# Patient Record
Sex: Male | Born: 1975 | Race: White | Hispanic: No | Smoking: Former smoker
Health system: Southern US, Community
[De-identification: ages and names within clinical notes are randomized; demographics above are authoritative.]

## PROBLEM LIST (undated history)

## (undated) DIAGNOSIS — F419 Anxiety disorder, unspecified: Secondary | ICD-10-CM

## (undated) DIAGNOSIS — I639 Cerebral infarction, unspecified: Secondary | ICD-10-CM

## (undated) DIAGNOSIS — G8929 Other chronic pain: Secondary | ICD-10-CM

## (undated) DIAGNOSIS — E119 Type 2 diabetes mellitus without complications: Secondary | ICD-10-CM

## (undated) DIAGNOSIS — R0600 Dyspnea, unspecified: Secondary | ICD-10-CM

## (undated) DIAGNOSIS — K859 Acute pancreatitis without necrosis or infection, unspecified: Secondary | ICD-10-CM

## (undated) DIAGNOSIS — I10 Essential (primary) hypertension: Secondary | ICD-10-CM

## (undated) DIAGNOSIS — Z22322 Carrier or suspected carrier of Methicillin resistant Staphylococcus aureus: Secondary | ICD-10-CM

## (undated) DIAGNOSIS — G4730 Sleep apnea, unspecified: Secondary | ICD-10-CM

## (undated) DIAGNOSIS — E669 Obesity, unspecified: Secondary | ICD-10-CM

## (undated) DIAGNOSIS — M419 Scoliosis, unspecified: Secondary | ICD-10-CM

## (undated) DIAGNOSIS — G629 Polyneuropathy, unspecified: Secondary | ICD-10-CM

## (undated) DIAGNOSIS — E7800 Pure hypercholesterolemia, unspecified: Secondary | ICD-10-CM

## (undated) DIAGNOSIS — M545 Low back pain: Secondary | ICD-10-CM

## (undated) DIAGNOSIS — K429 Umbilical hernia without obstruction or gangrene: Secondary | ICD-10-CM

## (undated) DIAGNOSIS — E785 Hyperlipidemia, unspecified: Secondary | ICD-10-CM

## (undated) DIAGNOSIS — Z9989 Dependence on other enabling machines and devices: Secondary | ICD-10-CM

## (undated) DIAGNOSIS — M1990 Unspecified osteoarthritis, unspecified site: Secondary | ICD-10-CM

## (undated) DIAGNOSIS — R519 Headache, unspecified: Secondary | ICD-10-CM

## (undated) DIAGNOSIS — R51 Headache: Secondary | ICD-10-CM

## (undated) DIAGNOSIS — G459 Transient cerebral ischemic attack, unspecified: Secondary | ICD-10-CM

## (undated) DIAGNOSIS — G4733 Obstructive sleep apnea (adult) (pediatric): Secondary | ICD-10-CM

## (undated) HISTORY — DX: Obstructive sleep apnea (adult) (pediatric): G47.33

## (undated) HISTORY — DX: Polyneuropathy, unspecified: G62.9

## (undated) HISTORY — PX: TESTICLE REMOVAL: SHX68

## (undated) HISTORY — DX: Dependence on other enabling machines and devices: Z99.89

## (undated) HISTORY — PX: HIP PINNING: SHX1757

## (undated) HISTORY — DX: Hyperlipidemia, unspecified: E78.5

---

## 2002-08-24 ENCOUNTER — Encounter: Payer: Self-pay | Admitting: Emergency Medicine

## 2002-08-24 ENCOUNTER — Emergency Department (HOSPITAL_COMMUNITY): Admission: EM | Admit: 2002-08-24 | Discharge: 2002-08-24 | Payer: Self-pay | Admitting: Emergency Medicine

## 2005-01-14 ENCOUNTER — Ambulatory Visit (HOSPITAL_BASED_OUTPATIENT_CLINIC_OR_DEPARTMENT_OTHER): Admission: RE | Admit: 2005-01-14 | Discharge: 2005-01-14 | Payer: Self-pay | Admitting: Family Medicine

## 2005-01-20 ENCOUNTER — Ambulatory Visit: Payer: Self-pay | Admitting: Internal Medicine

## 2006-05-11 ENCOUNTER — Emergency Department (HOSPITAL_COMMUNITY): Admission: EM | Admit: 2006-05-11 | Discharge: 2006-05-12 | Payer: Self-pay | Admitting: Emergency Medicine

## 2006-05-13 ENCOUNTER — Emergency Department (HOSPITAL_COMMUNITY): Admission: EM | Admit: 2006-05-13 | Discharge: 2006-05-14 | Payer: Self-pay | Admitting: Emergency Medicine

## 2006-10-03 ENCOUNTER — Emergency Department (HOSPITAL_COMMUNITY): Admission: EM | Admit: 2006-10-03 | Discharge: 2006-10-04 | Payer: Self-pay | Admitting: Emergency Medicine

## 2007-06-17 ENCOUNTER — Ambulatory Visit (HOSPITAL_COMMUNITY): Admission: RE | Admit: 2007-06-17 | Discharge: 2007-06-17 | Payer: Self-pay | Admitting: Family Medicine

## 2007-07-01 ENCOUNTER — Encounter: Admission: RE | Admit: 2007-07-01 | Discharge: 2007-07-01 | Payer: Self-pay | Admitting: Family Medicine

## 2008-01-12 ENCOUNTER — Emergency Department (HOSPITAL_COMMUNITY): Admission: EM | Admit: 2008-01-12 | Discharge: 2008-01-12 | Payer: Self-pay | Admitting: Family Medicine

## 2009-06-04 ENCOUNTER — Emergency Department (HOSPITAL_COMMUNITY): Admission: EM | Admit: 2009-06-04 | Discharge: 2009-06-04 | Payer: Self-pay | Admitting: Emergency Medicine

## 2010-03-25 ENCOUNTER — Encounter: Payer: Self-pay | Admitting: Chiropractic Medicine

## 2010-07-03 DIAGNOSIS — K859 Acute pancreatitis without necrosis or infection, unspecified: Secondary | ICD-10-CM

## 2010-07-03 HISTORY — PX: PICC LINE INSERTION: CATH118290

## 2010-07-03 HISTORY — DX: Acute pancreatitis without necrosis or infection, unspecified: K85.90

## 2010-07-08 ENCOUNTER — Inpatient Hospital Stay (HOSPITAL_COMMUNITY)
Admission: EM | Admit: 2010-07-08 | Discharge: 2010-07-21 | DRG: 438 | Disposition: A | Discharge status: Home / Self Care | Payer: Medicaid Other | Attending: Internal Medicine | Admitting: Internal Medicine

## 2010-07-08 ENCOUNTER — Emergency Department (HOSPITAL_COMMUNITY): Payer: Medicaid Other

## 2010-07-08 ENCOUNTER — Inpatient Hospital Stay (HOSPITAL_COMMUNITY): Payer: Medicaid Other

## 2010-07-08 DIAGNOSIS — K7689 Other specified diseases of liver: Secondary | ICD-10-CM | POA: Diagnosis present

## 2010-07-08 DIAGNOSIS — N453 Epididymo-orchitis: Secondary | ICD-10-CM | POA: Diagnosis not present

## 2010-07-08 DIAGNOSIS — E781 Pure hyperglyceridemia: Secondary | ICD-10-CM | POA: Diagnosis present

## 2010-07-08 DIAGNOSIS — Z833 Family history of diabetes mellitus: Secondary | ICD-10-CM

## 2010-07-08 DIAGNOSIS — Z6841 Body Mass Index (BMI) 40.0 and over, adult: Secondary | ICD-10-CM

## 2010-07-08 DIAGNOSIS — K859 Acute pancreatitis without necrosis or infection, unspecified: Principal | ICD-10-CM | POA: Diagnosis present

## 2010-07-08 DIAGNOSIS — N5089 Other specified disorders of the male genital organs: Secondary | ICD-10-CM | POA: Diagnosis not present

## 2010-07-08 DIAGNOSIS — E871 Hypo-osmolality and hyponatremia: Secondary | ICD-10-CM | POA: Diagnosis present

## 2010-07-08 DIAGNOSIS — F17200 Nicotine dependence, unspecified, uncomplicated: Secondary | ICD-10-CM | POA: Diagnosis present

## 2010-07-08 DIAGNOSIS — G4733 Obstructive sleep apnea (adult) (pediatric): Secondary | ICD-10-CM | POA: Diagnosis present

## 2010-07-08 DIAGNOSIS — R5381 Other malaise: Secondary | ICD-10-CM | POA: Diagnosis not present

## 2010-07-08 DIAGNOSIS — E1310 Other specified diabetes mellitus with ketoacidosis without coma: Secondary | ICD-10-CM | POA: Diagnosis present

## 2010-07-08 DIAGNOSIS — R609 Edema, unspecified: Secondary | ICD-10-CM | POA: Diagnosis not present

## 2010-07-08 LAB — BASIC METABOLIC PANEL
CO2: 17 mEq/L — ABNORMAL LOW (ref 19–32)
Calcium: 7.4 mg/dL — ABNORMAL LOW (ref 8.4–10.5)
Chloride: 104 mEq/L (ref 96–112)
Chloride: 106 mEq/L (ref 96–112)
Creatinine, Ser: 0.71 mg/dL (ref 0.4–1.5)
GFR calc Af Amer: >60 mL/min (ref >60)
GFR calc non Af Amer: >60 mL/min (ref >60)
Glucose, Bld: 262 mg/dL — ABNORMAL HIGH (ref 70–99)
Glucose, Bld: 225 mg/dL — ABNORMAL HIGH (ref 70–99)
Potassium: 3.6 mEq/L (ref 3.5–5.1)
Sodium: 136 mEq/L (ref 135–145)

## 2010-07-08 LAB — COMPREHENSIVE METABOLIC PANEL
ALT: 15 U/L (ref 0–53)
Albumin: 3.2 g/dL — ABNORMAL LOW (ref 3.5–5.2)
Alkaline Phosphatase: 108 U/L (ref 39–117)
BUN: 9 mg/dL (ref 6–23)
Chloride: 97 mEq/L (ref 96–112)
Glucose, Bld: 323 mg/dL — ABNORMAL HIGH (ref 70–99)
Potassium: 3.8 mEq/L (ref 3.5–5.1)
Sodium: 132 mEq/L — ABNORMAL LOW (ref 135–145)
Total Bilirubin: 0.6 mg/dL (ref 0.3–1.2)
Total Protein: 7.7 g/dL (ref 6.0–8.3)

## 2010-07-08 LAB — DIFFERENTIAL
Band Neutrophils: 0 % (ref 0–10)
Basophils Absolute: 0 10*3/uL (ref 0.0–0.1)
Basophils Relative: 0 % (ref 0–1)
Blasts: 0 %
Eosinophils Absolute: 0 10*3/uL (ref 0.0–0.7)
Lymphocytes Relative: 6 % — ABNORMAL LOW (ref 12–46)
Lymphs Abs: 0.8 10*3/uL (ref 0.7–4.0)
Metamyelocytes Relative: 0 %
Monocytes Absolute: 1.2 10*3/uL — ABNORMAL HIGH (ref 0.1–1.0)
Monocytes Relative: 9 % (ref 3–12)

## 2010-07-08 LAB — CBC
Platelets: 125 10*3/uL — ABNORMAL LOW (ref 150–400)
RBC: 4.95 MIL/uL (ref 4.22–5.81)
RDW: 13.6 % (ref 11.5–15.5)
WBC: 13.7 10*3/uL — ABNORMAL HIGH (ref 4.0–10.5)

## 2010-07-08 LAB — CARDIAC PANEL(CRET KIN+CKTOT+MB+TROPI)
Relative Index: INVALID (ref 0.0–2.5)
Total CK: 71 U/L (ref 7–232)
Troponin I: <0.3 ng/mL (ref <0.30)

## 2010-07-08 LAB — URINALYSIS, ROUTINE W REFLEX MICROSCOPIC
Bilirubin Urine: NEGATIVE
Ketones, ur: >80 mg/dL — AB
Nitrite: NEGATIVE
Urobilinogen, UA: 0.2 mg/dL (ref 0.0–1.0)

## 2010-07-08 LAB — URINE MICROSCOPIC-ADD ON

## 2010-07-08 LAB — GLUCOSE, CAPILLARY
Glucose-Capillary: 208 mg/dL — ABNORMAL HIGH (ref 70–99)
Glucose-Capillary: 211 mg/dL — ABNORMAL HIGH (ref 70–99)
Glucose-Capillary: 259 mg/dL — ABNORMAL HIGH (ref 70–99)
Glucose-Capillary: 263 mg/dL — ABNORMAL HIGH (ref 70–99)
Glucose-Capillary: 272 mg/dL — ABNORMAL HIGH (ref 70–99)

## 2010-07-08 LAB — PHOSPHORUS: Phosphorus: 2.2 mg/dL — ABNORMAL LOW (ref 2.3–4.6)

## 2010-07-09 ENCOUNTER — Encounter (HOSPITAL_COMMUNITY): Payer: Self-pay

## 2010-07-09 ENCOUNTER — Inpatient Hospital Stay (HOSPITAL_COMMUNITY): Payer: Medicaid Other

## 2010-07-09 LAB — LIPID PANEL
LDL Cholesterol: UNDETERMINED mg/dL (ref 0–99)
Triglycerides: 2228 mg/dL — ABNORMAL HIGH (ref <150)

## 2010-07-09 LAB — BASIC METABOLIC PANEL
BUN: 10 mg/dL (ref 6–23)
CO2: 19 mEq/L (ref 19–32)
CO2: 19 mEq/L (ref 19–32)
Calcium: 7.2 mg/dL — ABNORMAL LOW (ref 8.4–10.5)
Calcium: 7.6 mg/dL — ABNORMAL LOW (ref 8.4–10.5)
Chloride: 104 mEq/L (ref 96–112)
Chloride: 104 mEq/L (ref 96–112)
Creatinine, Ser: 0.66 mg/dL (ref 0.4–1.5)
Creatinine, Ser: 0.75 mg/dL (ref 0.4–1.5)
GFR calc Af Amer: >60 mL/min (ref >60)
Glucose, Bld: 179 mg/dL — ABNORMAL HIGH (ref 70–99)
Glucose, Bld: 175 mg/dL — ABNORMAL HIGH (ref 70–99)
Glucose, Bld: 206 mg/dL — ABNORMAL HIGH (ref 70–99)
Potassium: 4.2 mEq/L (ref 3.5–5.1)
Potassium: 4.3 mEq/L (ref 3.5–5.1)
Sodium: 135 mEq/L (ref 135–145)
Sodium: 135 mEq/L (ref 135–145)

## 2010-07-09 LAB — GLUCOSE, CAPILLARY
Glucose-Capillary: 184 mg/dL — ABNORMAL HIGH (ref 70–99)
Glucose-Capillary: 209 mg/dL — ABNORMAL HIGH (ref 70–99)
Glucose-Capillary: 141 mg/dL — ABNORMAL HIGH (ref 70–99)
Glucose-Capillary: 256 mg/dL — ABNORMAL HIGH (ref 70–99)

## 2010-07-09 LAB — CARDIAC PANEL(CRET KIN+CKTOT+MB+TROPI)
CK, MB: 1.3 ng/mL (ref 0.3–4.0)
Relative Index: INVALID (ref 0.0–2.5)
Total CK: 83 U/L (ref 7–232)
Troponin I: <0.3 ng/mL (ref <0.30)

## 2010-07-09 LAB — CBC
HCT: 43.4 % (ref 39.0–52.0)
Hemoglobin: 15.2 g/dL (ref 13.0–17.0)
MCHC: 35 g/dL (ref 30.0–36.0)

## 2010-07-09 MED ORDER — IOHEXOL 300 MG/ML  SOLN
100.0000 mL | Freq: Once | INTRAMUSCULAR | Status: AC | PRN
  Administered 2010-07-09: 100 mL via INTRAVENOUS

## 2010-07-10 LAB — BASIC METABOLIC PANEL
CO2: 19 mEq/L (ref 19–32)
Chloride: 99 mEq/L (ref 96–112)
Creatinine, Ser: 1.44 mg/dL (ref 0.4–1.5)
GFR calc Af Amer: >60 mL/min (ref >60)
Sodium: 130 mEq/L — ABNORMAL LOW (ref 135–145)

## 2010-07-10 LAB — COMPREHENSIVE METABOLIC PANEL
ALT: 12 U/L (ref 0–53)
CO2: 15 mEq/L — ABNORMAL LOW (ref 19–32)
Calcium: 7.6 mg/dL — ABNORMAL LOW (ref 8.4–10.5)
Chloride: 100 mEq/L (ref 96–112)
Creatinine, Ser: 1.29 mg/dL (ref 0.4–1.5)
GFR calc non Af Amer: >60 mL/min (ref >60)
Glucose, Bld: 284 mg/dL — ABNORMAL HIGH (ref 70–99)
Sodium: 131 mEq/L — ABNORMAL LOW (ref 135–145)
Total Bilirubin: 0.4 mg/dL (ref 0.3–1.2)

## 2010-07-10 LAB — HEMOGLOBIN A1C: Hgb A1c MFr Bld: 12.7 % — ABNORMAL HIGH (ref <5.7)

## 2010-07-10 LAB — GLUCOSE, CAPILLARY
Glucose-Capillary: 271 mg/dL — ABNORMAL HIGH (ref 70–99)
Glucose-Capillary: 298 mg/dL — ABNORMAL HIGH (ref 70–99)
Glucose-Capillary: 231 mg/dL — ABNORMAL HIGH (ref 70–99)

## 2010-07-11 LAB — GLUCOSE, CAPILLARY
Glucose-Capillary: 227 mg/dL — ABNORMAL HIGH (ref 70–99)
Glucose-Capillary: 216 mg/dL — ABNORMAL HIGH (ref 70–99)

## 2010-07-11 LAB — BASIC METABOLIC PANEL
CO2: 22 mEq/L (ref 19–32)
Chloride: 101 mEq/L (ref 96–112)
GFR calc Af Amer: >60 mL/min (ref >60)
Potassium: 4.4 mEq/L (ref 3.5–5.1)
Sodium: 132 mEq/L — ABNORMAL LOW (ref 135–145)

## 2010-07-11 LAB — CBC
Hemoglobin: 12.6 g/dL — ABNORMAL LOW (ref 13.0–17.0)
RBC: 4.1 MIL/uL — ABNORMAL LOW (ref 4.22–5.81)
WBC: 9.5 10*3/uL (ref 4.0–10.5)

## 2010-07-12 LAB — GLUCOSE, CAPILLARY
Glucose-Capillary: 156 mg/dL — ABNORMAL HIGH (ref 70–99)
Glucose-Capillary: 221 mg/dL — ABNORMAL HIGH (ref 70–99)

## 2010-07-12 LAB — CBC
HCT: 40.8 % (ref 39.0–52.0)
Hemoglobin: 12.7 g/dL — ABNORMAL LOW (ref 13.0–17.0)
MCHC: 31.1 g/dL (ref 30.0–36.0)
MCV: 96 fL (ref 78.0–100.0)
RDW: 14.6 % (ref 11.5–15.5)
WBC: 5.3 10*3/uL (ref 4.0–10.5)

## 2010-07-12 LAB — COMPREHENSIVE METABOLIC PANEL
Alkaline Phosphatase: 72 U/L (ref 39–117)
BUN: 18 mg/dL (ref 6–23)
CO2: 26 mEq/L (ref 19–32)
GFR calc non Af Amer: >60 mL/min (ref >60)
Glucose, Bld: 206 mg/dL — ABNORMAL HIGH (ref 70–99)
Potassium: 3.6 mEq/L (ref 3.5–5.1)
Total Protein: 6.6 g/dL (ref 6.0–8.3)

## 2010-07-12 LAB — LIPASE, BLOOD: Lipase: 46 U/L (ref 11–59)

## 2010-07-13 ENCOUNTER — Inpatient Hospital Stay (HOSPITAL_COMMUNITY): Payer: Medicaid Other

## 2010-07-13 LAB — GLUCOSE, CAPILLARY
Glucose-Capillary: 167 mg/dL — ABNORMAL HIGH (ref 70–99)
Glucose-Capillary: 154 mg/dL — ABNORMAL HIGH (ref 70–99)
Glucose-Capillary: 241 mg/dL — ABNORMAL HIGH (ref 70–99)

## 2010-07-13 LAB — CBC
Hemoglobin: 12.2 g/dL — ABNORMAL LOW (ref 13.0–17.0)
MCH: 30.3 pg (ref 26.0–34.0)
MCHC: 31.8 g/dL (ref 30.0–36.0)
Platelets: 94 10*3/uL — ABNORMAL LOW (ref 150–400)
RDW: 14.6 % (ref 11.5–15.5)

## 2010-07-13 LAB — BASIC METABOLIC PANEL
Calcium: 8.2 mg/dL — ABNORMAL LOW (ref 8.4–10.5)
Creatinine, Ser: 0.55 mg/dL (ref 0.4–1.5)
GFR calc Af Amer: >60 mL/min (ref >60)
GFR calc non Af Amer: >60 mL/min (ref >60)
Sodium: 137 mEq/L (ref 135–145)

## 2010-07-13 LAB — LIPASE, BLOOD: Lipase: 35 U/L (ref 11–59)

## 2010-07-14 LAB — LIPID PANEL
Cholesterol: 366 mg/dL — ABNORMAL HIGH (ref 0–200)
HDL: 21 mg/dL — ABNORMAL LOW (ref >39)
Triglycerides: 518 mg/dL — ABNORMAL HIGH (ref <150)
VLDL: UNDETERMINED mg/dL (ref 0–40)

## 2010-07-14 LAB — DIFFERENTIAL
Basophils Absolute: 0 10*3/uL (ref 0.0–0.1)
Basophils Relative: 0 % (ref 0–1)
Eosinophils Absolute: 0.1 10*3/uL (ref 0.0–0.7)
Lymphocytes Relative: 13 % (ref 12–46)
Monocytes Absolute: 0.9 10*3/uL (ref 0.1–1.0)
Neutrophils Relative %: 77 % (ref 43–77)

## 2010-07-14 LAB — COMPREHENSIVE METABOLIC PANEL
ALT: 13 U/L (ref 0–53)
AST: 17 U/L (ref 0–37)
Albumin: 1.8 g/dL — ABNORMAL LOW (ref 3.5–5.2)
CO2: 30 mEq/L (ref 19–32)
Chloride: 97 mEq/L (ref 96–112)
Creatinine, Ser: <0.47 mg/dL (ref 0.4–1.5)
Potassium: 3.4 mEq/L — ABNORMAL LOW (ref 3.5–5.1)
Sodium: 134 mEq/L — ABNORMAL LOW (ref 135–145)
Total Bilirubin: 0.4 mg/dL (ref 0.3–1.2)

## 2010-07-14 LAB — PHOSPHORUS: Phosphorus: 2.2 mg/dL — ABNORMAL LOW (ref 2.3–4.6)

## 2010-07-14 LAB — CBC
Hemoglobin: 12.7 g/dL — ABNORMAL LOW (ref 13.0–17.0)
MCH: 30.6 pg (ref 26.0–34.0)
Platelets: 112 10*3/uL — ABNORMAL LOW (ref 150–400)
RBC: 4.15 MIL/uL — ABNORMAL LOW (ref 4.22–5.81)
WBC: 9.6 10*3/uL (ref 4.0–10.5)

## 2010-07-14 LAB — GLUCOSE, CAPILLARY
Glucose-Capillary: 295 mg/dL — ABNORMAL HIGH (ref 70–99)
Glucose-Capillary: 317 mg/dL — ABNORMAL HIGH (ref 70–99)

## 2010-07-15 LAB — GLUCOSE, CAPILLARY
Glucose-Capillary: 206 mg/dL — ABNORMAL HIGH (ref 70–99)
Glucose-Capillary: 286 mg/dL — ABNORMAL HIGH (ref 70–99)

## 2010-07-15 LAB — CBC
HCT: 36.7 % — ABNORMAL LOW (ref 39.0–52.0)
Hemoglobin: 11.7 g/dL — ABNORMAL LOW (ref 13.0–17.0)
MCH: 30.2 pg (ref 26.0–34.0)
MCHC: 31.9 g/dL (ref 30.0–36.0)
RDW: 14.3 % (ref 11.5–15.5)

## 2010-07-15 LAB — LIPASE, BLOOD: Lipase: 31 U/L (ref 11–59)

## 2010-07-15 LAB — CULTURE, BLOOD (ROUTINE X 2)
Culture  Setup Time: 201205070006
Culture: NO GROWTH

## 2010-07-15 LAB — URINALYSIS, ROUTINE W REFLEX MICROSCOPIC
Bilirubin Urine: NEGATIVE
Glucose, UA: >1000 mg/dL — AB
Nitrite: NEGATIVE
Specific Gravity, Urine: 1.03 (ref 1.005–1.030)
pH: 7.5 (ref 5.0–8.0)

## 2010-07-15 LAB — BASIC METABOLIC PANEL
BUN: 11 mg/dL (ref 6–23)
CO2: 30 mEq/L (ref 19–32)
Calcium: 8.2 mg/dL — ABNORMAL LOW (ref 8.4–10.5)
Creatinine, Ser: <0.47 mg/dL (ref 0.4–1.5)
Glucose, Bld: 239 mg/dL — ABNORMAL HIGH (ref 70–99)

## 2010-07-16 ENCOUNTER — Inpatient Hospital Stay (HOSPITAL_COMMUNITY): Payer: Medicaid Other

## 2010-07-16 ENCOUNTER — Encounter (HOSPITAL_COMMUNITY): Payer: Self-pay | Admitting: Radiology

## 2010-07-16 LAB — GLUCOSE, CAPILLARY
Glucose-Capillary: 241 mg/dL — ABNORMAL HIGH (ref 70–99)
Glucose-Capillary: 252 mg/dL — ABNORMAL HIGH (ref 70–99)

## 2010-07-16 LAB — DIFFERENTIAL
Basophils Absolute: 0 10*3/uL (ref 0.0–0.1)
Eosinophils Absolute: 0.1 10*3/uL (ref 0.0–0.7)
Lymphocytes Relative: 14 % (ref 12–46)
Neutro Abs: 7.8 10*3/uL — ABNORMAL HIGH (ref 1.7–7.7)

## 2010-07-16 LAB — URINE CULTURE
Colony Count: NO GROWTH
Culture  Setup Time: 201205132040

## 2010-07-16 LAB — CBC
HCT: 35.1 % — ABNORMAL LOW (ref 39.0–52.0)
MCHC: 32.5 g/dL (ref 30.0–36.0)
MCV: 93.9 fL (ref 78.0–100.0)
RDW: 14.1 % (ref 11.5–15.5)

## 2010-07-16 LAB — CHOLESTEROL, TOTAL: Cholesterol: 264 mg/dL — ABNORMAL HIGH (ref 0–200)

## 2010-07-16 LAB — MAGNESIUM: Magnesium: 1.9 mg/dL (ref 1.5–2.5)

## 2010-07-16 LAB — COMPREHENSIVE METABOLIC PANEL
BUN: 9 mg/dL (ref 6–23)
Calcium: 7.8 mg/dL — ABNORMAL LOW (ref 8.4–10.5)
Glucose, Bld: 249 mg/dL — ABNORMAL HIGH (ref 70–99)
Sodium: 133 mEq/L — ABNORMAL LOW (ref 135–145)
Total Protein: 6.1 g/dL (ref 6.0–8.3)

## 2010-07-16 LAB — PHOSPHORUS: Phosphorus: 2.9 mg/dL (ref 2.3–4.6)

## 2010-07-16 MED ORDER — IOHEXOL 300 MG/ML  SOLN
125.0000 mL | Freq: Once | INTRAMUSCULAR | Status: AC | PRN
  Administered 2010-07-16: 125 mL via INTRAVENOUS

## 2010-07-16 NOTE — H&P (Signed)
NAMEMARISA, [name]                ACCOUNT NO.:  [id number]  MEDICAL RECORD NO.:  [id number]           PATIENT TYPE:  I  LOCATION:  1409                         FACILITY:  [facility]  PHYSICIAN:  [name], M.D.DATE OF BIRTH:  08/07/1975  DATE OF ADMISSION:  07/08/2010 DATE OF DISCHARGE:                             HISTORY & PHYSICAL   CHIEF COMPLAINT:  Abdominal pain with nausea and vomiting.  HISTORY OF PRESENT ILLNESS:  The patient is a 35 year old morbidly obese white male with a history of sleep apnea, who presents with the above complaints.  He states that he developed abdominal pain on the day prior to admission - Described as diffuse, sharp, 10/10 in intensity, constant and only relieved temporarily after he came to the ED and was given IV narcotics.  He also reports associated nausea with vomiting x3, nonbloody.  He denies chest pain, shortness of breath, dysuria, diarrhea, melena, cough and no hematochezia.  The patient also admits to polyuria and polydipsia and states that in the past month, he has lost weight unintentionally, although he is unable to quantify the amount of weight loss at this time.  He was seen in the ED and was found to have a temperature of 100.2 and on arrival to the floor, his temperature was 101.  His chemistries revealed a blood glucose of 323 with a CO2 of 13 and patient denies prior history of diabetes, although in further talking to the patient, he states that a doctor told him in the past that he had diabetes and was given a prescription for metformin, but he followed up with another doctor and was told that he did not have diabetes, so he has never been taking any metformin.  A lipase level was done and was elevated at 2014 and he had an ultrasound done of his abdomen, which showed no evidence for acute cholecystitis, fatty liver noted.  No hydronephrosis.  He is admitted for further evaluation and management.  PAST MEDICAL HISTORY:   As above.  MEDICATIONS:  Advil p.r.n.  ALLERGIES:  NKDA.  SOCIAL HISTORY:  Positive for tobacco, one pack per day for 15-17 years. He denies alcohol - States none in the last 10 years.  FAMILY HISTORY:  His aunt and uncle both have diabetes mellitus and one of his uncles had CABG done at age 41.  REVIEW OF SYSTEMS:  As per HPI, other review of systems negative.  PHYSICAL EXAMINATION:  GENERAL:  The patient is an obese young white male, he looks older than his stated age.  He is diaphoretic, breathing is nonlabored. VITAL SIGNS:  His temperature is 101.3 and his pulse is 128.  His blood pressure is 117/53. HEENT:  PERRL, EOMI, sclerae anicteric, dry mucous membranes and no oral exudates. NECK:  Supple.  No adenopathy, no thyromegaly and no JVD. LUNGS:  Decreased breath sounds at the bases, otherwise clear to auscultation.  No crackles or wheezes. CARDIOVASCULAR:  Tachycardic, regular rhythm, normal S1 and S2.  No S3 appreciated. ABDOMEN:  Obese.  Bowel sounds present, mild diffuse tenderness but greater in the right upper quadrant.  No rebound  tenderness.  No masses palpable. EXTREMITIES:  No cyanosis and no edema. NEUROLOGIC:  He is drowsy, but arouses easily (status post Dilaudid). He is oriented x3.  Cranial Nerves: II to XII are grossly intact. Nonfocal exam.  LABORATORY DATA:  As per HPI.  Also, his urinalysis is negative for infection and his sodium is 132 with a potassium of 3.8, chloride is 97, CO2 is 13, glucose is 323, BUN of 9, creatinine 0.61.  His total bilirubin is 0.6, alkaline phosphatase is 108, SGOT 16, SGPT is 15, total protein is 7.7, albumin is 3.2, calcium is 8.7.  His lipase is 2014 and his white cell count is 13.7, hemoglobin is 15.7, hematocrit 43.8.  His MCV is 88.5, platelet count is 125,000.  ASSESSMENT AND PLAN: 1. Acute pancreatitis:  Unclear etiology as discussed above.  Patient     denies alcohol and his abdominal ultrasound does not show      gallstones.  We will obtain a triglyceride level and phosphate     level as well follow.  He was on no medications outpatient except     for the p.r.n. Advil.  We will keep the patient n.p.o. for bowel     rest.  Intravenous fluids, pain management and follow. 2. Diabetic ketoacidosis:  We will place on intravenous insulin via     glucose stabilizer, obtain hemoglobin A1c.  Aggressive intravenous     fluids, follow and transition to Lantus once he meets glucose     stabilizer criteria.  We will also evaluate for other precipitating     factors as:  Obtain cardiac enzymes, EKG, follow.  We will also     obtain a chest x-ray to evaluate for infection.  We will check     BMETs q.4h. and further manage accordingly. 3. Fevers:  Likely secondary to acute pancreatitis.  Blood cultures     and a chest x-ray as above.  Urinalysis is negative for infection     as already mentioned above. 4. Sleep apnea:  Continuous positive airway pressure per RT and     follow. 5. Morbid obesity. 6. Hyponatremia:  Component of pseudohyponatremia present due to     elevated blood sugars and also patient volume depleted.  We will     hydrate, treat diabetic ketoacidosis as above.  Follow and recheck.     [name], [name]     ACV/MEDQ  D:  07/08/2010  T:  07/08/2010  Job:  [id number]  Electronically Signed by Donnalee Curry [name] on 07/16/2010 12:13:21 PM

## 2010-07-17 LAB — CBC
MCH: 30.3 pg (ref 26.0–34.0)
Platelets: 108 10*3/uL — ABNORMAL LOW (ref 150–400)
RBC: 3.86 MIL/uL — ABNORMAL LOW (ref 4.22–5.81)
RDW: 14 % (ref 11.5–15.5)
WBC: 10.5 10*3/uL (ref 4.0–10.5)

## 2010-07-17 LAB — GLUCOSE, CAPILLARY
Glucose-Capillary: 250 mg/dL — ABNORMAL HIGH (ref 70–99)
Glucose-Capillary: 185 mg/dL — ABNORMAL HIGH (ref 70–99)
Glucose-Capillary: 215 mg/dL — ABNORMAL HIGH (ref 70–99)

## 2010-07-17 LAB — BASIC METABOLIC PANEL
Chloride: 94 mEq/L — ABNORMAL LOW (ref 96–112)
Creatinine, Ser: <0.47 mg/dL (ref 0.4–1.5)

## 2010-07-17 NOTE — Consult Note (Signed)
NAMEORVELL, CAREAGA                ACCOUNT NO.:  [id number]  MEDICAL RECORD NO.:  [id number]           PATIENT TYPE:  I  LOCATION:  1409                         FACILITY:  [facility]  PHYSICIAN:  Valetta Fuller, [name]  DATE OF BIRTH:  [birth year]  DATE OF CONSULTATION:  07/16/2010 DATE OF DISCHARGE:                                CONSULTATION   REASON FOR CONSULTATION:  Scrotal swelling, discomfort, and questionable bilateral epididymitis  HISTORY OF PRESENT ILLNESS:  [name] is 35 years of age.  He has a number of medical issues including morbid obesity and was admitted with abdominal pain, nausea and vomiting approximately a week ago.  The patient has been diagnosed with acute pancreatitis, etiology unclear. He was having diffuse abdominal pain as well as fever.  At the time of his admission, he was having no dysuria and had an unremarkable urine. He has been on Primaxin.  The patient does have a current indwelling Foley catheter.  Indications for that were not entirely clear but I suspect they were for fluid management, I's and O's.  The patient has had some progressively increasing scrotal edema.  He has also had discomfort in his scrotum.  The patient had a scrotal ultrasound performed several days ago.  This showed normal appearing testes bilaterally.  Both epididymis appeared to be somewhat enlarged on ultrasound with some increased vascularity.  The patient had hydroceles bilaterally as well.  Skin was markedly edematous.  We were consulted again for possible epididymitis.  PAST MEDICAL HISTORY:  Notable for morbid obesity and sleep apnea.  He denies any previous urologic history.  CURRENT MEDICATIONS:  The patient's current medications include aspirin, Crestor, Zocor, Primaxin.  ALLERGIES:  The patient has no drug allergies.  The patient does have a 15 to 20 pack year smoking history.  FAMILY HISTORY:  Positive for diabetes mellitus.  PHYSICAL EXAMINATION:  GENERAL:   On exam, the patient is an obese male, in no acute distress. VITAL SIGNS:  He is currently afebrile with a blood pressure 131/85 and pulse of 99.  Room air sats are 96%. NECK:  Shows no obvious mass or JVD. RESPIRATORY:  Respiratory effort is normal. HEART:  Regular rate and rhythm. ABDOMEN:  His abdomen is obese.  He does have mild diffuse tenderness. GENITALIA:  External genitalia reveals an indwelling Foley catheter. The scrotum is markedly edematous.  There is no evidence of cellulitic change or necrosis.  He has severe pitting edema of the scrotum and the entire scrotal enlargement appears to be primarily due to substantial scrotal wall edema.  Testes are nonpalpable due to the scrotal edema.  ASSESSMENT:  Massive scrotal enlargement.  This appears to be related primarily to fluid overload situation/anasarca/lymphedema.  His scrotal enlargement appears almost entirely due to scrotal wall edema but again no evidence of cellulitis, necrosis etc..  The patient on ultrasound does have some component of hydrocele but I think that is contributing very little to his overall scrotal enlargement.  There was a suggestion of possible epididymitis based on epididymal enlargement and increased blood flow on the scrotal ultrasound but this is typically  a clinical diagnosis.  Normally the epididymis becomes rock-hard with a substantial epididymal and testicular enlargement.  Normally this can be palpated but in his case it is difficult.  Urinalysis is generally markedly abnormal with epididymitis, although the only urinalysis we have on him is at the time of admission.  Certainly I cannot rule out the possibility of some bilateral epididymitis at this juncture but again most of his scrotal discomfort and swelling appears to be related to the scrotal wall edema.  He is on appropriate antibiotics, it would be utilized to treat for epididymitis.  Therefore no other treatment is required at this  time.  Obviously if his clinical situation worsens, then a repeat ultrasound will be necessary.  Urine should be reassessed and culture obtained as well.  Otherwise general treatment for the generalized edema with scrotal elevation, possible diuretics, and removal of the Foley catheter as soon as that is clinically feasible.     Valetta Fuller, [name]     DSG/MEDQ  D:  07/16/2010  T:  07/16/2010  Job:  [id number]  Electronically Signed by Barron Alvine [name] on 07/17/2010 07:03:02 PM

## 2010-07-18 LAB — CBC
HCT: 35.2 % — ABNORMAL LOW (ref 39.0–52.0)
Hemoglobin: 11.4 g/dL — ABNORMAL LOW (ref 13.0–17.0)
MCH: 30.2 pg (ref 26.0–34.0)
MCV: 93.4 fL (ref 78.0–100.0)
Platelets: 125 10*3/uL — ABNORMAL LOW (ref 150–400)
RBC: 3.77 MIL/uL — ABNORMAL LOW (ref 4.22–5.81)

## 2010-07-18 LAB — GLUCOSE, CAPILLARY
Glucose-Capillary: 230 mg/dL — ABNORMAL HIGH (ref 70–99)
Glucose-Capillary: 204 mg/dL — ABNORMAL HIGH (ref 70–99)
Glucose-Capillary: 224 mg/dL — ABNORMAL HIGH (ref 70–99)

## 2010-07-18 LAB — BASIC METABOLIC PANEL
CO2: 30 mEq/L (ref 19–32)
Calcium: 8.1 mg/dL — ABNORMAL LOW (ref 8.4–10.5)
Chloride: 94 mEq/L — ABNORMAL LOW (ref 96–112)
Glucose, Bld: 204 mg/dL — ABNORMAL HIGH (ref 70–99)
Potassium: 4.1 mEq/L (ref 3.5–5.1)
Sodium: 130 mEq/L — ABNORMAL LOW (ref 135–145)

## 2010-07-19 LAB — GLUCOSE, CAPILLARY: Glucose-Capillary: 189 mg/dL — ABNORMAL HIGH (ref 70–99)

## 2010-07-19 NOTE — Group Therapy Note (Signed)
[name], [name]                ACCOUNT NO.:  [id number]  MEDICAL RECORD NO.:  [id number]           PATIENT TYPE:  I  LOCATION:  1409                         FACILITY:  [facility]  PHYSICIAN:  Thad Ranger, MD       DATE OF BIRTH:  1975-07-07                                PROGRESS NOTE   CURRENT DIAGNOSES: 1. Acute pancreatitis. 2. Hypertriglyceridemia. 3. Diabetic ketoacidosis, resolved. 4. Obstructive sleep apnea, on CPAP. 5. Morbid obesity. 6. Epididymitis, bilateral. 7. Hyponatremia. 8. Deconditioning.  CONSULTATION: Urology Dr. Barron Alvine.  BRIEF HISTORY OF PRESENT ILLNESS: At this time of admission, [name] is a 35 year old male with history of sleep apnea who presented with abdominal pain, nausea and vomiting on the day prior to admission.  In the emergency room, the patient was found to have a temperature of 100.2 and her blood sugar of 323 with CO2 of 13.  Lipase level was done which she was elevated at 2014 and he was admitted with DKA as well acute pancreatitis.  RADIOLOGICAL DATA: 1. On Jul 08, 2010, ultrasound of abdomen showed no evidence of acute     cholecystitis, fatty liver, no hydronephrosis. 2. Chest x-ray on Jul 08, 2010, low lung volumes, no acute airspace     disease. 3. CT of abdomen and pelvis with contrast on Jul 10, 2010, showed     diffuse peripancreatic edema and fluid extending down to the pelvis     consistent with acute pancreatitis.  No evidence of pancreatic     necrosis, abscess, or pseudocyst, anterior abdominal wall hernia in     the pelvis containing fat. 4. Ultrasounds of scrotum on Jul 13, 2010, showed findings suggestive     of bilateral epididymitis with marked skin thickening and edema of     the scrotum bilateral, moderate sized, mildly complex hydroceles,     normal appearance of both testes. 5. CT of abdomen and pelvis with contrast on Jul 16, 2010, showed     peripancreatic inflammatory changes and pancreatic ascites has   increased, no evidence of pancreatic necrosis or focal fluid     collection to suggest any abscess or pseudocyst of venous     thrombosis.  BRIEF HOSPITALIZATION COURSE: [name] is a 35 year old male who was admitted with acute pancreatitis likely secondary to hypertriglyceridemia. 1. Acute pancreatitis secondary to hypertriglyceridemia.  The patient     was admitted to the medical service.  Lipase was highly elevated at     2014.  Ultrasound of abdomen did not show any acute cholecystitis.     Lipid profile showed elevated triglycerides at 2228 with a     cholesterol level of 594.  The patient was initially placed on     n.p.o. status, IV fluids and pain control.  The patient had a     prolonged hospitalization course secondary to pain and worsening of     the symptoms on advancing diet.  The patient was placed on TNA on     Jul 13, 2010.  The patient was given a trial of solids starting     yesterday  on Jul 16, 2010, however, the patient's abdominal pain     and symptoms worsened after the trial of solids.  CT of abdomen and     pelvis was obtained which showed peripancreatic inflammatory     changes and pancreatic ascites, this has increased, although no     pancreatic necrosis or any abscess.  The diet will be downgraded to     full liquids for another 1 or 2 days before restarting the solids. 2. Hypertriglyceridemia.  The patient was started on Lopid and     Crestor.  Repeat lipid levels have improved. 3. DKA, new diagnosis of diabetes mellitus.  The patient was started     on IV fluids and intravenous insulin and currently has been doing     well on the Lantus and sliding scale insulin.  The patient was also     provided diabetic teaching in anticipation of discharge. 4. Massive scrotal edema with bilateral epididymitis.  The patient     also has massive scrotal edema possibly secondary to epididymitis     or the third spacing.  Ultrasound of the scrotum was done which was      suggestive of bilateral epididymitis with mild to moderate sized     mildly complex hydroceles.  Urology was consulted and agree with     continuing the current management.  He is on imipenem and Urology     is also in agreement with antibiotics choice.  There is no other     treatment required at this time but elevation and decreasing the     edema.  I have also added the Lasix today to improve the edema.  PHYSICAL EXAMINATION: VITAL SIGNS:  At the time of my dictation, temperature 99.0, pulse 93, respirations 20, blood pressure 117/74. GENERAL:  The patient is alert, awake and oriented x3, in discomfort secondary to abdominal pain. CV:  S1, S2. CHEST:  Clear to auscultation bilaterally. ABDOMEN:  Tenderness in epigastric region, obese, soft. EXTREMITIES:  Massive scrotal edema.  DISPOSITION: Not medically ready yet.  Discharge medication will be dictated at the time of actual discharge.     Thad Ranger, MD     RR/MEDQ  D:  07/17/2010  T:  07/17/2010  Job:  [id number]  Electronically Signed by RIPUDEEP RAI  on 07/19/2010 01:59:17 PM

## 2010-07-20 LAB — LIPASE, BLOOD: Lipase: 37 U/L (ref 11–59)

## 2010-07-20 LAB — GLUCOSE, CAPILLARY
Glucose-Capillary: 181 mg/dL — ABNORMAL HIGH (ref 70–99)
Glucose-Capillary: 181 mg/dL — ABNORMAL HIGH (ref 70–99)
Glucose-Capillary: 245 mg/dL — ABNORMAL HIGH (ref 70–99)
Glucose-Capillary: 253 mg/dL — ABNORMAL HIGH (ref 70–99)
Glucose-Capillary: 257 mg/dL — ABNORMAL HIGH (ref 70–99)

## 2010-07-20 LAB — COMPREHENSIVE METABOLIC PANEL
ALT: 9 U/L (ref 0–53)
Albumin: 2 g/dL — ABNORMAL LOW (ref 3.5–5.2)
Alkaline Phosphatase: 74 U/L (ref 39–117)
Calcium: 8.5 mg/dL (ref 8.4–10.5)
Glucose, Bld: 157 mg/dL — ABNORMAL HIGH (ref 70–99)
Potassium: 4.1 mEq/L (ref 3.5–5.1)
Sodium: 132 mEq/L — ABNORMAL LOW (ref 135–145)
Total Protein: 6.5 g/dL (ref 6.0–8.3)

## 2010-07-20 NOTE — Procedures (Signed)
[name], [name]                ACCOUNT NO.:  [id number]   MEDICAL RECORD NO.:  [id number]          PATIENT TYPE:  OUT   LOCATION:  SLEEP CENTER                 FACILITY:  [facility]   PHYSICIAN:  Clinton D. Maple Hudson, [name] DATE OF BIRTH:  1975/05/26   DATE OF STUDY:  01/14/2005                              NOCTURNAL POLYSOMNOGRAM   REFERRING PHYSICIAN:  Dr. Aida Puffer.   DATE OF STUDY:  January 14, 2005.   INDICATION FOR STUDY:  Hypersomnia with sleep apnea.   EPWORTH SLEEPINESS SCORE:  21/24.   BMI:  48.   WEIGHT:  330 pounds.   SLEEP ARCHITECTURE:  Total sleep time 373 minutes with sleep efficiency 87%.  Stage I was 40%, stage II 52%, stages III and IV absent, REM 8% of total  sleep time. Sleep latency 10 minutes, REM latency 241 minutes, awake after  sleep onset 46 minutes, arousal index increased at 61. No bedtime medication  taken.   RESPIRATORY DATA:  Split study protocol. Apnea/hypopnea index (AHI, RDI) 54  obstructive events per hour indicating severe obstructive sleep  apnea/hypopnea syndrome before C-PAP. There were 164 obstructive apneas, 22  mixed apneas and 150 hypopneas. He slept only supine. REM AHI 8.3 per hour.  C-PAP was titrated to 13 CWP, AHI 0 per hour. A ResMed Swift was used with  medium nasal pillows and heated humidifier.   OXYGEN DATA:  Moderately loud snoring with oxygen desaturation to a nadir of  56% before C-PAP control. After C-PAP, saturation held 95-98% on room air.   CARDIAC DATA:  Normal sinus rhythm.   MOVEMENT/PARASOMNIA:  A total of 49 limb jerks were reported of which 14  were associated with arousal or awakening for periodic limb movement with  arousal index of 2.3 per hour which is mildly increased.   IMPRESSION/RECOMMENDATIONS:  1.  Severe obstructive sleep apnea/hypopnea syndrome, AHI 54 per hour with      moderately loud snoring and oxygen desaturation to 56%.  2.  Successful C-PAP titration to 13 CWP, AHI 0 per hour. A ResMed  Swift      with medium nasal pillows and heated humidifier was used.      Clinton D. Maple Hudson, [name]  Diplomate, Biomedical engineer of Sleep Medicine  Electronically Signed     CDY/MEDQ  D:  01/20/2005 11:30:51  T:  01/20/2005 23:37:26  Job:  [id number]

## 2010-07-21 LAB — GLUCOSE, CAPILLARY
Glucose-Capillary: 154 mg/dL — ABNORMAL HIGH (ref 70–99)
Glucose-Capillary: 239 mg/dL — ABNORMAL HIGH (ref 70–99)

## 2010-07-21 NOTE — Discharge Summary (Signed)
NAMEJAWAD, WIACEK                ACCOUNT NO.:  [id number]  MEDICAL RECORD NO.:  [id number]           PATIENT TYPE:  I  LOCATION:  1409                         FACILITY:  [facility]  PHYSICIAN:  Andreas Blower, MD       DATE OF BIRTH:  [birth year]  DATE OF ADMISSION:  07/08/2010 DATE OF DISCHARGE:                              DISCHARGE SUMMARY   PRIMARY CARE PHYSICIAN:  The patient does not have one.  The patient is to be scheduled to see [facility].  DISCHARGE DIAGNOSES: 1. Acute pancreatitis. 2. Hypertriglyceridemia. 3. Diabetic ketoacidosis, resolved. 4. New diagnosis of diabetes. 5. Obstructive sleep apnea, on continuous positive airway pressure. 6. Morbid obesity. 7. Bilateral epididymitis. 8. Hyponatremia. 9. Tobacco use.  CONSULTATION:  Valetta Fuller, [name], Urology.  DISCHARGE MEDICATIONS: 1. Aspirin 81 mg p.o. daily 2. Furosemide 40 mg p.o. daily for 7 days and to discontinue. 3. Gemfibrozil 600 mg p.o. twice daily before meals. 4. NovoLog 70/30 subcu twice daily. 5. Nicotine 21 mg patch transdermally daily for 30 days. 6. Omeprazole 20 mg p.o. twice daily. 7. Oxycodone 5 mg IR every 6 hours as needed for pain. 8. Pravastatin 40 mg p.o. daily at bedtime.  BRIEF ADMITTING HISTORY AND PHYSICAL:  Mr. Saine is a 35 year old Caucasian male with history of sleep apnea who presented with abdominal pain, nausea and vomiting prior to admission.  Initially on admission, the patient's lipase was elevated at 2014 and was also admitted for management for DKA.  RADIOLOGY/IMAGING: 1. The patient had abdominal ultrasound on Jul 08, 2010, which shows no     evidence for acute cholecystitis.  Fatty liver.  No hydronephrosis. 2. The patient had portable chest x-ray on Jul 08, 2010, which shows     low lung volumes.  No acute airspace disease. 3. The patient had CT of the abdomen and pelvis with contrast on Jul 10, 2010, which showed diffuse peripancreatic edema and fluid  extending down to the pelvis consistent with acute pancreatitis. 4. The patient had scrotal ultrasound on Jul 13, 2010, which showed     findings to suggest bilateral epididymitis.  There was also marked     skin thickening and edema of the scrotum.  Bilateral moderate-sized     mildly complex hydroceles.  Normal appearance of the testes. 5. The patient had CT of the abdomen and pelvis with contrast on Jul 16, 2010, which showed peripancreatic inflammation changes and     pancreatic ascites have increased.  No evidence for pancreatic     necrosis.  No evidence for fluid collection to suggest an abscess     or pseudocyst.  LABORATORY DATA:  CBC shows a white count of 11.8, hemoglobin 11.4, hematocrit 125.  Electrolytes; sodium 132, potassium 4.0, chloride 93, CO2 of 21, BUN 7, creatinine 0.63, lipase is 37.  Hemoglobin A1c 12.7.  Troponins negative x3.  Serum cholesterol was 594. TSH is 1.11.  Blood alcohol level less than 11.  UA was negative for nitrites and leukocytes.  HOSPITAL COURSE:  Please refer to progress note dictated by Dr.  Rai on Jul 17, 2010 for more details. 1. Acute pancreatitis secondary to hypertriglyceridemia.  The     patient's initial lipase was elevated at 2014.  Ultrasound did not     show any acute cholecystitis.  The patient initially was made     n.p.o. and was aggressively hydrated on IV fluids and was     controlled with pain medications.  The patient had a prolonged     hospitalization secondary to pain control and worsening symptoms     when diet was advanced.  The patient initially was placed on     peripheral nutrition on Jul 13, 2010, and he was started on trial     of solids on Jul 16, 2010, however, he had worsening pain; as a     result, he was restarted on a trial of solids on Jul 19, 2010, and     tolerated it well.  A repeat CT of the abdomen and pelvis did not     show any pancreatic necrosis or abscess or cyst.  The patient     during the  course of hospital stay was started on imipenem from the     Jul 11, 2010 until Jul 20, 2010, to complete a course of     antibiotics. 2. Hypertriglyceridemia.  Started the patient on gemfibrozil and     rosuvastatin; however, given the patient does not have insurance,     rosuvastatin was transitioned to pravastatin. 3. DKA with new diagnosis of diabetes.  The patient again was started     on IV fluids, was started on insulin.  Given his insurance status,     he will be discharged home on NovoLog 70/30, 30 units subcu twice     daily.  Further titration of insulin to be done as an outpatient.     The patient was also provided diabetic teaching and will have     outpatient diabetic education. 4. Massive scrotal edema and swelling with bilateral epididymitis.     The patient during the course of hospital stay was aggressively     hydrated.  It is unclear if the patient had epididymitis or third     spacing.  Ultrasound suggested epididymitis but Urology did not     think the patient truly had epididymitis.  The patient was on     imipenem which Urology thought would cover for epididymitis.  He     was started on Lasix for peripheral edema which he will continue     for 7 more days at discharge. 5. Tobacco use.  The patient was encouraged smoking cessation.  He has     indicated that he is interested in quitting smoking.  Prescription     for nicotine patches provided to the patient at discharge.  DISPOSITION AND FOLLOWUP:  The patient to follow up with [facility] Dr. Andrey Campanile on September 11, 2010, at 2:00 p.m.  The patient has eligibility appointment on Aug 01, 2010, at 11:00 a.m..  Time spent on discharge, talking to the patient, coordinating care was 40 minutes.   Andreas Blower, MD   SR/MEDQ  D:  07/21/2010  T:  07/21/2010  Job:  [id number]  Electronically Signed by Wardell Heath Dazia Lippold  on 07/21/2010 05:34:37 PM

## 2010-07-27 ENCOUNTER — Emergency Department (HOSPITAL_COMMUNITY): Payer: Medicaid Other

## 2010-07-27 ENCOUNTER — Inpatient Hospital Stay (HOSPITAL_COMMUNITY)
Admission: EM | Admit: 2010-07-27 | Discharge: 2010-08-25 | DRG: 424 | Disposition: A | Discharge status: Home Health | Payer: Medicaid Other | Attending: Internal Medicine | Admitting: Internal Medicine

## 2010-07-27 DIAGNOSIS — Z6841 Body Mass Index (BMI) 40.0 and over, adult: Secondary | ICD-10-CM

## 2010-07-27 DIAGNOSIS — K4090 Unilateral inguinal hernia, without obstruction or gangrene, not specified as recurrent: Secondary | ICD-10-CM | POA: Diagnosis present

## 2010-07-27 DIAGNOSIS — Z9981 Dependence on supplemental oxygen: Secondary | ICD-10-CM

## 2010-07-27 DIAGNOSIS — E119 Type 2 diabetes mellitus without complications: Secondary | ICD-10-CM | POA: Diagnosis present

## 2010-07-27 DIAGNOSIS — K429 Umbilical hernia without obstruction or gangrene: Secondary | ICD-10-CM | POA: Diagnosis present

## 2010-07-27 DIAGNOSIS — N498 Inflammatory disorders of other specified male genital organs: Secondary | ICD-10-CM | POA: Diagnosis present

## 2010-07-27 DIAGNOSIS — N433 Hydrocele, unspecified: Secondary | ICD-10-CM | POA: Diagnosis present

## 2010-07-27 DIAGNOSIS — E781 Pure hyperglyceridemia: Secondary | ICD-10-CM | POA: Diagnosis present

## 2010-07-27 DIAGNOSIS — G4733 Obstructive sleep apnea (adult) (pediatric): Secondary | ICD-10-CM | POA: Diagnosis present

## 2010-07-27 DIAGNOSIS — K862 Cyst of pancreas: Secondary | ICD-10-CM | POA: Diagnosis present

## 2010-07-27 DIAGNOSIS — F17200 Nicotine dependence, unspecified, uncomplicated: Secondary | ICD-10-CM | POA: Diagnosis present

## 2010-07-27 DIAGNOSIS — K859 Acute pancreatitis without necrosis or infection, unspecified: Principal | ICD-10-CM | POA: Diagnosis present

## 2010-07-27 DIAGNOSIS — M47817 Spondylosis without myelopathy or radiculopathy, lumbosacral region: Secondary | ICD-10-CM | POA: Diagnosis present

## 2010-07-27 DIAGNOSIS — Z794 Long term (current) use of insulin: Secondary | ICD-10-CM

## 2010-07-27 DIAGNOSIS — K863 Pseudocyst of pancreas: Secondary | ICD-10-CM | POA: Diagnosis present

## 2010-07-27 DIAGNOSIS — E876 Hypokalemia: Secondary | ICD-10-CM | POA: Diagnosis not present

## 2010-07-27 LAB — COMPREHENSIVE METABOLIC PANEL
ALT: 14 U/L (ref 0–53)
BUN: 18 mg/dL (ref 6–23)
CO2: 27 mEq/L (ref 19–32)
Calcium: 9.3 mg/dL (ref 8.4–10.5)
Creatinine, Ser: 0.79 mg/dL (ref 0.4–1.5)
GFR calc non Af Amer: >60 mL/min (ref >60)
Glucose, Bld: 184 mg/dL — ABNORMAL HIGH (ref 70–99)

## 2010-07-27 LAB — GLUCOSE, CAPILLARY

## 2010-07-27 LAB — CBC
HCT: 39.9 % (ref 39.0–52.0)
Hemoglobin: 13 g/dL (ref 13.0–17.0)
MCH: 30.1 pg (ref 26.0–34.0)
MCHC: 32.6 g/dL (ref 30.0–36.0)
MCV: 92.4 fL (ref 78.0–100.0)
RDW: 12.9 % (ref 11.5–15.5)

## 2010-07-27 LAB — URINALYSIS, ROUTINE W REFLEX MICROSCOPIC
Hgb urine dipstick: NEGATIVE
Ketones, ur: NEGATIVE mg/dL
Specific Gravity, Urine: 1.026 (ref 1.005–1.030)
pH: 5 (ref 5.0–8.0)

## 2010-07-27 LAB — DIFFERENTIAL
Eosinophils Relative: 1 % (ref 0–5)
Lymphocytes Relative: 13 % (ref 12–46)
Monocytes Absolute: 0.6 10*3/uL (ref 0.1–1.0)
Monocytes Relative: 5 % (ref 3–12)
Neutro Abs: 11.2 10*3/uL — ABNORMAL HIGH (ref 1.7–7.7)

## 2010-07-27 LAB — LACTIC ACID, PLASMA: Lactic Acid, Venous: 1.6 mmol/L (ref 0.5–2.2)

## 2010-07-27 LAB — LIPASE, BLOOD: Lipase: 54 U/L (ref 11–59)

## 2010-07-27 MED ORDER — IOHEXOL 300 MG/ML  SOLN
100.0000 mL | Freq: Once | INTRAMUSCULAR | Status: AC | PRN
  Administered 2010-07-27: 100 mL via INTRAVENOUS

## 2010-07-27 MED ORDER — IOHEXOL 300 MG/ML  SOLN
80.0000 mL | Freq: Once | INTRAMUSCULAR | Status: AC | PRN
  Administered 2010-07-27: 80 mL via INTRAVENOUS

## 2010-07-28 ENCOUNTER — Other Ambulatory Visit: Payer: Self-pay | Admitting: Urology

## 2010-07-28 ENCOUNTER — Inpatient Hospital Stay (HOSPITAL_COMMUNITY): Payer: Self-pay

## 2010-07-28 LAB — BASIC METABOLIC PANEL
BUN: 10 mg/dL (ref 6–23)
Calcium: 7.8 mg/dL — ABNORMAL LOW (ref 8.4–10.5)
Chloride: 103 mEq/L (ref 96–112)
Creatinine, Ser: 0.64 mg/dL (ref 0.4–1.5)
GFR calc Af Amer: >60 mL/min (ref >60)
GFR calc non Af Amer: >60 mL/min (ref >60)

## 2010-07-28 LAB — GLUCOSE, CAPILLARY
Glucose-Capillary: 122 mg/dL — ABNORMAL HIGH (ref 70–99)
Glucose-Capillary: 140 mg/dL — ABNORMAL HIGH (ref 70–99)
Glucose-Capillary: 120 mg/dL — ABNORMAL HIGH (ref 70–99)
Glucose-Capillary: 140 mg/dL — ABNORMAL HIGH (ref 70–99)

## 2010-07-28 LAB — URINE CULTURE
Culture  Setup Time: 201205251822
Culture: NO GROWTH

## 2010-07-28 LAB — CBC
MCH: 29.9 pg (ref 26.0–34.0)
MCHC: 31.9 g/dL (ref 30.0–36.0)
MCV: 93.7 fL (ref 78.0–100.0)
Platelets: 208 10*3/uL (ref 150–400)
RBC: 3.48 MIL/uL — ABNORMAL LOW (ref 4.22–5.81)
RDW: 13.4 % (ref 11.5–15.5)

## 2010-07-28 LAB — DIFFERENTIAL
Basophils Absolute: 0 10*3/uL (ref 0.0–0.1)
Eosinophils Absolute: 0 10*3/uL (ref 0.0–0.7)
Lymphocytes Relative: 14 % (ref 12–46)
Monocytes Absolute: 0.9 10*3/uL (ref 0.1–1.0)
Neutro Abs: 10.1 10*3/uL — ABNORMAL HIGH (ref 1.7–7.7)

## 2010-07-28 LAB — SURGICAL PCR SCREEN: Staphylococcus aureus: POSITIVE — AB

## 2010-07-28 NOTE — Op Note (Signed)
[name], [name]                ACCOUNT NO.:  [id number]  MEDICAL RECORD NO.:  [id number]           PATIENT TYPE:  I  LOCATION:  1519                         FACILITY:  [facility]  PHYSICIAN:  Angelia Mould. Derrell Lolling, M.D.DATE OF BIRTH:  21-Apr-1975  DATE OF PROCEDURE:  07/28/2010 DATE OF DISCHARGE:                              OPERATIVE REPORT   Intraoperative Consultation and Operative Note  PREOPERATIVE DIAGNOSIS:  Right scrotal abscess.  POSTOPERATIVE DIAGNOSIS:  Right scrotal abscess with entrapped omentum with fat necrosis, right inguinal canal.  OPERATION PERFORMED: 1. Intraoperative general surgery consultation. 2. Surgical cyst for right orchiectomy and partial omentectomy.  SURGEON:  Angelia Mould. Derrell Lolling, [name]  OPERATIVE INDICATIONS:  This is a 35 year old Caucasian male, who was recently hospitalized for moderately severe acute pancreatitis.  About two and half weeks ago, he was actually seen by urology because of right scrotal edema.  He was discharged home.  He has been readmitted with progressive pain and swelling in the right scrotum.  CT scan shows the testicle to be quite inflamed and probable abscess in the scrotum and some of the inflammation extends up the inguinal canal.  Dr. Bjorn Pippin took him to the operating room today emergently for exploration of the scrotum.  OPERATIVE TECHNIQUE:  Dr. Bjorn Pippin called me to the operating room. At this point in time, he had already opened the right scrotum and had mobilized the testicle, which was inflamed, but viable and the entire inguinal cord, which was quite thickened and purulent.  Cultures had been taken.  He stated that he was going to have to perform an orchiectomy, but was concerned about the contents of the cord structures that whether he might have intestine caught in along with the cord structures.  I extended his scrotal incision up the inguinal canal to the level of the internal ring and with retractors, we  could expose the entire cord. We opened up a very thin envelope, which might have been an indirect hernia sac, although I am not very sure.  We found the cord structures and separated these from what looked like omentum with fat necrosis. There was no odor up there.  There was no purulence, but the fatty tissue was clearly discolored and looked like perhaps fat necrosis secondary to his pancreatitis.  In small steps, we isolated the testicular vessels and vas deferens, clamped them, divided them and ligated them with 0 Vicryl ties.  In small steps, we  isolated the fatty tissue, clamped and divided and ligated it with the 0 Vicryl ties. The specimen was sent to the lab.  There was no intestine caught in this.  We irrigated the area out.  Hemostasis was excellent.  There might have been some enlargement of the internal ring, but there was no obvious hernia or clear cut hernia sac that I could detect.  We did not feel there was any indication for any formal hernia repair at this time, although we felt that he may develop a hernia in the future.  There was no protrusion of any gastrointestinal contents.  Dr. Annabell Howells simply irrigated the wound and placed  drains and packing and loosely closed the skin.  At this point of the case, I scrubbed out and concluded my intraoperative consultation and surgical assistance.     Angelia Mould. Derrell Lolling, [name]     HMI/MEDQ  D:  07/28/2010  T:  07/28/2010  Job:  [id number]  cc:   Excell Seltzer. Annabell Howells, [name] Fax: [phone]  Electronically Signed by Claud Kelp [name] on 07/28/2010 11:20:27 PM

## 2010-07-29 LAB — GLUCOSE, CAPILLARY
Glucose-Capillary: 135 mg/dL — ABNORMAL HIGH (ref 70–99)
Glucose-Capillary: 119 mg/dL — ABNORMAL HIGH (ref 70–99)
Glucose-Capillary: 126 mg/dL — ABNORMAL HIGH (ref 70–99)
Glucose-Capillary: 135 mg/dL — ABNORMAL HIGH (ref 70–99)

## 2010-07-29 LAB — BASIC METABOLIC PANEL
CO2: 23 mEq/L (ref 19–32)
Calcium: 8.3 mg/dL — ABNORMAL LOW (ref 8.4–10.5)
GFR calc Af Amer: >60 mL/min (ref >60)
Glucose, Bld: 124 mg/dL — ABNORMAL HIGH (ref 70–99)
Potassium: 3.8 mEq/L (ref 3.5–5.1)
Sodium: 133 mEq/L — ABNORMAL LOW (ref 135–145)

## 2010-07-29 LAB — CBC
HCT: 32.5 % — ABNORMAL LOW (ref 39.0–52.0)
MCH: 29.9 pg (ref 26.0–34.0)
MCV: 93.4 fL (ref 78.0–100.0)
RDW: 13.5 % (ref 11.5–15.5)
WBC: 9.7 10*3/uL (ref 4.0–10.5)

## 2010-07-30 LAB — CBC
MCH: 29.8 pg (ref 26.0–34.0)
MCHC: 32.6 g/dL (ref 30.0–36.0)
Platelets: 176 10*3/uL (ref 150–400)
RBC: 3.36 MIL/uL — ABNORMAL LOW (ref 4.22–5.81)

## 2010-07-30 LAB — BASIC METABOLIC PANEL
Calcium: 8 mg/dL — ABNORMAL LOW (ref 8.4–10.5)
Creatinine, Ser: 0.56 mg/dL (ref 0.4–1.5)
GFR calc Af Amer: >60 mL/min (ref >60)
Sodium: 137 mEq/L (ref 135–145)

## 2010-07-30 LAB — GLUCOSE, CAPILLARY
Glucose-Capillary: 102 mg/dL — ABNORMAL HIGH (ref 70–99)
Glucose-Capillary: 121 mg/dL — ABNORMAL HIGH (ref 70–99)

## 2010-07-30 NOTE — Consult Note (Signed)
NAMETWAIN, STENSETH                ACCOUNT NO.:  [id number]  MEDICAL RECORD NO.:  [id number]           PATIENT TYPE:  I  LOCATION:  1519                         FACILITY:  [facility]  PHYSICIAN:  Excell Seltzer. Annabell Howells, [name]    DATE OF BIRTH:  11/17/1975  DATE OF CONSULTATION:  07/28/2010 DATE OF DISCHARGE:                                CONSULTATION   CHIEF COMPLAINT:  Right scrotal swelling.  HISTORY:  [name] is a 35 year old white male diabetic who was admitted through the emergency room with progressive abdominal pain and worsening swelling and pain in the scrotum and groin with fever to 103.  He was seen by Dr. Isabel Caprice earlier this month and had some scrotal edema with a possible right hydrocele that was felt to be secondary to fluid overload.  However, now he has had resolution of the bulk of the scrotal edema, but with persistent swelling and induration on the right.  A CT scan done on admission revealed a fluid collection around the right testicle extending into the inguinal area.  There was a suggestion of a hernia on the right, but it appears to me that it is more likely a fluid collection extending up from the scrotum and possibly an abscess.  PAST MEDICAL HISTORY:  His past history is significant for poorly- controlled diabetes, obstructive sleep apnea, morbid obesity, history of acute pancreatitis.  PAST SURGICAL HISTORY:  Unremarkable.  MEDICATIONS ON ADMISSION:  Include furosemide, aspirin, gemfibrozil, NovoLog, nicotine patch, omeprazole, oxycodone, and pravastatin.  He has been placed on vancomycin and Zosyn pending his cultures.  ALLERGIES:  He has no drug allergies.  SOCIAL HISTORY:  He is married.  He smokes.  He denies alcohol in 10 years.  He denies drug use.  FAMILY HISTORY:  Significant for diabetes and coronary artery disease.  PHYSICAL EXAMINATION:  VITAL SIGNS:  On exam, his T-max is 102.9 and now it is 98.4 with a pulse of 108, respirations 16, blood pressure  is 113/71. GENERAL:  In general, he is a well-developed, well-nourished, obese white male, in no acute distress. HEAD AND FACE:  Normocephalic and atraumatic. NECK:  Supple without mass. LUNGS:  Clear with normal effort. HEART:  Rapid rate, regular rhythm. ABDOMEN:  Soft, obese with mid abdominal tenderness.  He has no obvious inguinal hernias on exam, but has some fullness in the right inguinal area without evidence of adenopathy. GU EXAM:  Reveals an unremarkable phallus.  There is right scrotal swelling with orange peel type edema and tenderness.  The testicle is not palpable within the indurated swelling, which extends into the inguinal canal.  The left hemiscrotum is unremarkable with unremarkable testicles and epididymides. EXTREMITIES:  Have full range of motion without edema. SKIN:  Warm and dry. NEUROLOGICALLY:  He has no focal deficits.  LABORATORY DATA:  His white count is 13.7, hemoglobin 13, platelet count is 266.  Chemistries are normal with exception of glucose of 184. Lipase is 54.  Lactic acid is 1.6.  I have reviewed his CT films and report with results noted above.  IMPRESSION:  Probable right scrotal abscess with fever in  a patient with poorly-controlled diabetes and acute pancreatitis.  PLAN:  At this point, he needs to have right scrotal exploration with probable incision and drainage of scrotal abscess.  The risk of bleeding, infection, testicular injury or loss, need for extensive debridement, thrombotic events and anesthetic complications were reviewed with the patient.  He is willing to proceed.     Excell Seltzer. Annabell Howells, [name]     JJW/MEDQ  D:  07/28/2010  T:  07/28/2010  Job:  [id number]  cc:   Emeline General, MD  [facility]  Electronically Signed by Bjorn Pippin [name] on 07/30/2010 06:43:03 PM

## 2010-07-30 NOTE — Op Note (Signed)
[name], [name]                ACCOUNT NO.:  [id number]  MEDICAL RECORD NO.:  [id number]           PATIENT TYPE:  I  LOCATION:  1519                         FACILITY:  [facility]  PHYSICIAN:  Excell Seltzer. Annabell Howells, [name]    DATE OF BIRTH:  06/15/1975  DATE OF PROCEDURE:  07/28/2010 DATE OF DISCHARGE:                              OPERATIVE REPORT   PATIENT OF:  Trial hospitalist.  PROCEDURE:  Right scrotal exploration with hydrocelectomy and right inguinal orchiectomy.  PREOPERATIVE DIAGNOSIS:  Right scrotal abscess and hydrocele.  POSTOPERATIVE DIAGNOSIS:  Right hydrocele with right spermatic cord abscess and fat necrosis possibly secondary to acute pancreatitis.  SURGEON:  Excell Seltzer. Annabell Howells, [name]  ASSISTANT:  Angelia Mould. Derrell Lolling, [name]  ANESTHESIA:  General.  SPECIMEN:  Right testicle and spermatic cord and wound cultures sent to pathology.  ESTIMATED BLOOD LOSS:  50 mL.  DRAINS:  Half inch Penrose drain and 4x4 wound wicks.  COMPLICATIONS:  None.  INDICATIONS:  [name] is a 35 year old white male diabetic with history of acute pancreatitis, who was initially seen by Dr. Isabel Caprice on approximately Jul 16, 2010 for scrotal swelling.  At that time, it was felt to be edema related to anasarca and ultrasound only showed a small hydrocele.  He was readmitted to the hospital on Jul 27, 2010 and has had some progressive right scrotal swelling, but resolution of the left scrotal swelling.  A CT scan revealed hydrocele around the left testicle, but there appeared to be a fluid-filled structure extending into the inguinal area worrisome for an abscess.  It was felt that exploration was indicated.  FINDINGS OF THE PROCEDURE:  He was taken to the operating room where general anesthetic was induced.  His scrotum was clipped.  He was prepped with Betadine solution and draped in a usual sterile fashion. Time-out was performed.  He had been on Zosyn and vancomycin preoperatively and had PAS hose.   A right oblique scrotal incision was made with a knife.  This was carried down through a very inflamed, thickened dartos using the Bovie.  The tunica vaginalis was entered and he was noted to have amber fluid under pressure around the testicle. Initial inspection at this point demonstrated a fairly normal-appearing testicle with some inflammation of the epididymis; however, the spermatic cord appeared quite adherent and enlarged.  The incision was initially extended laterally and the spermatic cord freed up.  Further inspection revealed significant inflammatory and necrotic changes of the spermatic cord.  The cord was probed with hemostat and purulent material was noted within the cord.  Aerobic and anaerobic cultures were obtained and sent for pathology.  At this point, I was concerned that he may have patent processes vaginalis with intestinal contents contributing to this problem.  It was concerned that he might have an inguinal hernia and wanted to ensure no bowel was involved.  At this point, Dr. Claud Kelp was consulted and he came to participate in the procedure and we extended the incision into the inguinal area.  With the extension of the incision, we were better able to determine the extent of the  changes, which were up to just about the internal ring.  Inspection of the spermatic cord revealed some bright yellow changes and findings were felt to be most consistent with fat necrosis probably related to his acute pancreatitis.  It was felt that the spermatic cord inflammatory changes were sufficiently severe and involving the testicular blood supply that is testicular salvage, which was impractical and unsafe.  At this point, the cord was broken into 4 packets using Kelly clamps and the specimen was removed including the cord, testicle and epididymis. This was handed off the field.  The packets were then doubly ligated with 0 Vicryl ties.  Hemostasis was then ensured using  the Bovie.  The hydrocele sac had largely been removed on the initial dissection.  Once hemostasis was achieved, a 1/2-inch Penrose drain was placed from the inferior portion of the scrotal incision up into the inguinal area and secured with a 2-0 Vicryl to the scrotal skin.  The incision was then loosely reapproximated using three interrupted 3-0 chromic sutures and 4x4 saline set wicks were placed between the sutures.  A dressing of 4x4s and ABD and scrotal support was then placed.  The patient's anesthetic was reversed and he was moved to the recovery room in stable condition.  There were no complications during the procedure.     Excell Seltzer. Annabell Howells, [name]     JJW/MEDQ  D:  07/28/2010  T:  07/28/2010  Job:  [id number]  cc:   Valetta Fuller, [name] Fax: [phone]  Angelia Mould. Derrell Lolling, [name] [address], [address] [place] [address]  Electronically Signed by Bjorn Pippin [name] on 07/30/2010 06:43:01 PM

## 2010-07-31 LAB — GLUCOSE, CAPILLARY
Glucose-Capillary: 106 mg/dL — ABNORMAL HIGH (ref 70–99)
Glucose-Capillary: 140 mg/dL — ABNORMAL HIGH (ref 70–99)

## 2010-07-31 LAB — BASIC METABOLIC PANEL
BUN: 3 mg/dL — ABNORMAL LOW (ref 6–23)
CO2: 27 mEq/L (ref 19–32)
Calcium: 7.8 mg/dL — ABNORMAL LOW (ref 8.4–10.5)
Creatinine, Ser: 0.48 mg/dL (ref 0.4–1.5)
Glucose, Bld: 80 mg/dL (ref 70–99)
Sodium: 138 mEq/L (ref 135–145)

## 2010-07-31 LAB — CULTURE, ROUTINE-ABSCESS: Gram Stain: NONE SEEN

## 2010-07-31 LAB — CBC
HCT: 30 % — ABNORMAL LOW (ref 39.0–52.0)
Hemoglobin: 9.8 g/dL — ABNORMAL LOW (ref 13.0–17.0)
MCH: 29.8 pg (ref 26.0–34.0)
MCHC: 32.7 g/dL (ref 30.0–36.0)
MCV: 91.2 fL (ref 78.0–100.0)

## 2010-08-01 LAB — COMPREHENSIVE METABOLIC PANEL
AST: 15 U/L (ref 0–37)
Albumin: 1.9 g/dL — ABNORMAL LOW (ref 3.5–5.2)
Calcium: 8 mg/dL — ABNORMAL LOW (ref 8.4–10.5)
Chloride: 101 mEq/L (ref 96–112)
Creatinine, Ser: 0.55 mg/dL (ref 0.4–1.5)
GFR calc Af Amer: >60 mL/min (ref >60)

## 2010-08-01 LAB — CBC
MCH: 29.3 pg (ref 26.0–34.0)
MCHC: 31.8 g/dL (ref 30.0–36.0)
Platelets: 174 10*3/uL (ref 150–400)
RBC: 3.17 MIL/uL — ABNORMAL LOW (ref 4.22–5.81)

## 2010-08-01 LAB — GLUCOSE, CAPILLARY
Glucose-Capillary: 108 mg/dL — ABNORMAL HIGH (ref 70–99)
Glucose-Capillary: 145 mg/dL — ABNORMAL HIGH (ref 70–99)

## 2010-08-02 LAB — DIFFERENTIAL
Basophils Absolute: 0.1 10*3/uL (ref 0.0–0.1)
Eosinophils Relative: 2 % (ref 0–5)
Lymphs Abs: 1.6 10*3/uL (ref 0.7–4.0)
Monocytes Absolute: 0.4 10*3/uL (ref 0.1–1.0)
Monocytes Relative: 7 % (ref 3–12)
Neutro Abs: 3.7 10*3/uL (ref 1.7–7.7)

## 2010-08-02 LAB — GLUCOSE, CAPILLARY
Glucose-Capillary: 193 mg/dL — ABNORMAL HIGH (ref 70–99)
Glucose-Capillary: 147 mg/dL — ABNORMAL HIGH (ref 70–99)

## 2010-08-02 LAB — COMPREHENSIVE METABOLIC PANEL
ALT: 10 U/L (ref 0–53)
AST: 13 U/L (ref 0–37)
Albumin: 2.1 g/dL — ABNORMAL LOW (ref 3.5–5.2)
CO2: 31 mEq/L (ref 19–32)
Calcium: 8.7 mg/dL (ref 8.4–10.5)
Chloride: 101 mEq/L (ref 96–112)
Creatinine, Ser: <0.47 mg/dL (ref 0.4–1.5)
Potassium: 3.2 mEq/L — ABNORMAL LOW (ref 3.5–5.1)
Total Protein: 6.7 g/dL (ref 6.0–8.3)

## 2010-08-02 LAB — TRIGLYCERIDES: Triglycerides: 175 mg/dL — ABNORMAL HIGH (ref <150)

## 2010-08-02 LAB — ANAEROBIC CULTURE

## 2010-08-02 LAB — CULTURE, BLOOD (ROUTINE X 2)
Culture  Setup Time: 201205252335
Culture: NO GROWTH

## 2010-08-02 LAB — CBC
HCT: 30 % — ABNORMAL LOW (ref 39.0–52.0)
MCV: 91.7 fL (ref 78.0–100.0)
RBC: 3.27 MIL/uL — ABNORMAL LOW (ref 4.22–5.81)
WBC: 5.9 10*3/uL (ref 4.0–10.5)

## 2010-08-02 LAB — PREALBUMIN: Prealbumin: 7.4 mg/dL — ABNORMAL LOW (ref 17.0–34.0)

## 2010-08-02 LAB — PHOSPHORUS: Phosphorus: 3.8 mg/dL (ref 2.3–4.6)

## 2010-08-02 LAB — CHOLESTEROL, TOTAL: Cholesterol: 94 mg/dL (ref 0–200)

## 2010-08-03 LAB — GLUCOSE, CAPILLARY
Glucose-Capillary: 203 mg/dL — ABNORMAL HIGH (ref 70–99)
Glucose-Capillary: 214 mg/dL — ABNORMAL HIGH (ref 70–99)

## 2010-08-04 LAB — GLUCOSE, CAPILLARY
Glucose-Capillary: 105 mg/dL — ABNORMAL HIGH (ref 70–99)
Glucose-Capillary: 119 mg/dL — ABNORMAL HIGH (ref 70–99)
Glucose-Capillary: 188 mg/dL — ABNORMAL HIGH (ref 70–99)

## 2010-08-04 LAB — BASIC METABOLIC PANEL
CO2: 28 mEq/L (ref 19–32)
Calcium: 8.1 mg/dL — ABNORMAL LOW (ref 8.4–10.5)
Chloride: 100 mEq/L (ref 96–112)
Creatinine, Ser: 0.51 mg/dL (ref 0.4–1.5)
GFR calc Af Amer: >60 mL/min (ref >60)
Glucose, Bld: 217 mg/dL — ABNORMAL HIGH (ref 70–99)

## 2010-08-05 LAB — COMPREHENSIVE METABOLIC PANEL
Albumin: 2.3 g/dL — ABNORMAL LOW (ref 3.5–5.2)
Alkaline Phosphatase: 119 U/L — ABNORMAL HIGH (ref 39–117)
BUN: 8 mg/dL (ref 6–23)
Creatinine, Ser: 0.66 mg/dL (ref 0.4–1.5)
Glucose, Bld: 208 mg/dL — ABNORMAL HIGH (ref 70–99)
Potassium: 3.8 mEq/L (ref 3.5–5.1)
Total Bilirubin: 0.3 mg/dL (ref 0.3–1.2)
Total Protein: 6.7 g/dL (ref 6.0–8.3)

## 2010-08-05 LAB — VANCOMYCIN, TROUGH: Vancomycin Tr: 17.8 ug/mL (ref 10.0–20.0)

## 2010-08-05 LAB — CULTURE, BLOOD (ROUTINE X 2)
Culture  Setup Time: 201205280035
Culture: NO GROWTH

## 2010-08-05 LAB — CBC
MCH: 29.2 pg (ref 26.0–34.0)
MCHC: 31.9 g/dL (ref 30.0–36.0)
MCV: 91.4 fL (ref 78.0–100.0)
Platelets: 194 10*3/uL (ref 150–400)
RBC: 3.39 MIL/uL — ABNORMAL LOW (ref 4.22–5.81)

## 2010-08-05 LAB — GLUCOSE, CAPILLARY: Glucose-Capillary: 156 mg/dL — ABNORMAL HIGH (ref 70–99)

## 2010-08-05 LAB — LIPASE, BLOOD: Lipase: 25 U/L (ref 11–59)

## 2010-08-06 LAB — CBC
HCT: 30.9 % — ABNORMAL LOW (ref 39.0–52.0)
Hemoglobin: 9.7 g/dL — ABNORMAL LOW (ref 13.0–17.0)
MCH: 29.3 pg (ref 26.0–34.0)
MCHC: 31.4 g/dL (ref 30.0–36.0)
RDW: 13.5 % (ref 11.5–15.5)

## 2010-08-06 LAB — PHOSPHORUS: Phosphorus: 4.5 mg/dL (ref 2.3–4.6)

## 2010-08-06 LAB — GLUCOSE, CAPILLARY: Glucose-Capillary: 189 mg/dL — ABNORMAL HIGH (ref 70–99)

## 2010-08-06 LAB — MAGNESIUM: Magnesium: 2.4 mg/dL (ref 1.5–2.5)

## 2010-08-06 LAB — DIFFERENTIAL
Eosinophils Relative: 1 % (ref 0–5)
Lymphocytes Relative: 20 % (ref 12–46)
Monocytes Absolute: 0.5 10*3/uL (ref 0.1–1.0)
Monocytes Relative: 5 % (ref 3–12)
Neutro Abs: 7.4 10*3/uL (ref 1.7–7.7)

## 2010-08-06 LAB — COMPREHENSIVE METABOLIC PANEL
ALT: 48 U/L (ref 0–53)
BUN: 12 mg/dL (ref 6–23)
CO2: 29 mEq/L (ref 19–32)
Calcium: 8.8 mg/dL (ref 8.4–10.5)
Creatinine, Ser: 0.71 mg/dL (ref 0.4–1.5)
GFR calc non Af Amer: >60 mL/min (ref >60)
Glucose, Bld: 185 mg/dL — ABNORMAL HIGH (ref 70–99)

## 2010-08-06 LAB — CHOLESTEROL, TOTAL: Cholesterol: 114 mg/dL (ref 0–200)

## 2010-08-06 LAB — TRIGLYCERIDES: Triglycerides: 140 mg/dL (ref <150)

## 2010-08-07 LAB — GLUCOSE, CAPILLARY
Glucose-Capillary: 209 mg/dL — ABNORMAL HIGH (ref 70–99)
Glucose-Capillary: 216 mg/dL — ABNORMAL HIGH (ref 70–99)
Glucose-Capillary: 228 mg/dL — ABNORMAL HIGH (ref 70–99)

## 2010-08-07 LAB — BASIC METABOLIC PANEL
Calcium: 9.1 mg/dL (ref 8.4–10.5)
Chloride: 100 mEq/L (ref 96–112)
Creatinine, Ser: 0.7 mg/dL (ref 0.4–1.5)
GFR calc non Af Amer: >60 mL/min (ref >60)
Glucose, Bld: 197 mg/dL — ABNORMAL HIGH (ref 70–99)
Potassium: 4.1 mEq/L (ref 3.5–5.1)

## 2010-08-07 LAB — PHOSPHORUS: Phosphorus: 4.6 mg/dL (ref 2.3–4.6)

## 2010-08-08 ENCOUNTER — Inpatient Hospital Stay (HOSPITAL_COMMUNITY): Payer: Medicaid Other

## 2010-08-08 LAB — GLUCOSE, CAPILLARY
Glucose-Capillary: 190 mg/dL — ABNORMAL HIGH (ref 70–99)
Glucose-Capillary: 216 mg/dL — ABNORMAL HIGH (ref 70–99)
Glucose-Capillary: 232 mg/dL — ABNORMAL HIGH (ref 70–99)
Glucose-Capillary: 239 mg/dL — ABNORMAL HIGH (ref 70–99)

## 2010-08-08 LAB — CLOSTRIDIUM DIFFICILE BY PCR: Toxigenic C. Difficile by PCR: NEGATIVE

## 2010-08-08 MED ORDER — IOHEXOL 300 MG/ML  SOLN: 40.0000 mL | Freq: Once | INTRAMUSCULAR | Status: AC | PRN

## 2010-08-09 ENCOUNTER — Inpatient Hospital Stay (HOSPITAL_COMMUNITY): Payer: Medicaid Other

## 2010-08-09 LAB — COMPREHENSIVE METABOLIC PANEL
ALT: 45 U/L (ref 0–53)
AST: 21 U/L (ref 0–37)
Albumin: 2.5 g/dL — ABNORMAL LOW (ref 3.5–5.2)
Alkaline Phosphatase: 142 U/L — ABNORMAL HIGH (ref 39–117)
CO2: 26 mEq/L (ref 19–32)
Chloride: 99 mEq/L (ref 96–112)
Creatinine, Ser: 0.91 mg/dL (ref 0.4–1.5)
GFR calc Af Amer: >60 mL/min (ref >60)
GFR calc non Af Amer: >60 mL/min (ref >60)
Potassium: 4.6 mEq/L (ref 3.5–5.1)
Sodium: 135 mEq/L (ref 135–145)
Total Bilirubin: 0.7 mg/dL (ref 0.3–1.2)

## 2010-08-09 LAB — GLUCOSE, CAPILLARY
Glucose-Capillary: 167 mg/dL — ABNORMAL HIGH (ref 70–99)
Glucose-Capillary: 187 mg/dL — ABNORMAL HIGH (ref 70–99)
Glucose-Capillary: 233 mg/dL — ABNORMAL HIGH (ref 70–99)

## 2010-08-09 MED ORDER — IOHEXOL 300 MG/ML  SOLN
100.0000 mL | Freq: Once | INTRAMUSCULAR | Status: AC | PRN
  Administered 2010-08-09: 100 mL via INTRAVENOUS

## 2010-08-10 LAB — CBC
HCT: 28.3 % — ABNORMAL LOW (ref 39.0–52.0)
MCHC: 31.8 g/dL (ref 30.0–36.0)
MCV: 91.6 fL (ref 78.0–100.0)
RDW: 13.7 % (ref 11.5–15.5)
WBC: 13.3 10*3/uL — ABNORMAL HIGH (ref 4.0–10.5)

## 2010-08-10 LAB — GLUCOSE, CAPILLARY
Glucose-Capillary: 180 mg/dL — ABNORMAL HIGH (ref 70–99)
Glucose-Capillary: 197 mg/dL — ABNORMAL HIGH (ref 70–99)
Glucose-Capillary: 203 mg/dL — ABNORMAL HIGH (ref 70–99)

## 2010-08-10 LAB — BASIC METABOLIC PANEL
BUN: 21 mg/dL (ref 6–23)
GFR calc non Af Amer: >60 mL/min (ref >60)
Glucose, Bld: 198 mg/dL — ABNORMAL HIGH (ref 70–99)
Potassium: 4.5 mEq/L (ref 3.5–5.1)

## 2010-08-11 LAB — GLUCOSE, CAPILLARY
Glucose-Capillary: 176 mg/dL — ABNORMAL HIGH (ref 70–99)
Glucose-Capillary: 157 mg/dL — ABNORMAL HIGH (ref 70–99)
Glucose-Capillary: 156 mg/dL — ABNORMAL HIGH (ref 70–99)
Glucose-Capillary: 161 mg/dL — ABNORMAL HIGH (ref 70–99)

## 2010-08-11 NOTE — H&P (Signed)
[name], [name]                ACCOUNT NO.:  [id number]  MEDICAL RECORD NO.:  [id number]           PATIENT TYPE:  E  LOCATION:  WLED                         FACILITY:  [facility]  PHYSICIAN:  Della Goo, [name] DATE OF BIRTH:  1976-01-16  DATE OF ADMISSION:  07/27/2010 DATE OF DISCHARGE:                             HISTORY & PHYSICAL   DATE OF ADMISSION:  Jul 27, 2010 precise  PRIMARY CARE PHYSICIAN:  None.  The patient is scheduled to establish with [facility]  CHIEF COMPLAINT:  Abdominal pain, worsening swelling of his scrotum.  HISTORY OF PRESENT ILLNESS:  This is a 35 year old male who presents to the emergency department secondary to worsening abdominal pain over the past 24 hours in the epigastrium and across the upper abdomen area.  He was recently hospitalized for acute pancreatitis and as well had increased swelling of the scrotum which was evaluated by Urology at that time.  The patient had been seen.  He had been placed on antibiotic therapy of imipenem to treat for bilateral epididymitis.  He was seen at that time by urologist, Dr. Isabel Caprice.  The patient was hospitalized from Jul 08, 2010 until Jul 21, 2010 for acute pancreatitis and the epididymitis.  The patient reports today beginning to have nausea and vomiting along with the abdominal pain and being unable to hold down any foods or liquids.  He also began to have a fever in the emergency department to 102.8.  Blood cultures were performed and urine was sent for culture as well and the patient was placed on IV antibiotic therapy of vancomycin and Zosyn and referred for medical admission.  The patient's family are also at the bedside and the patient reports that his swelling in the groin area has decreased on one side but the right side has hardened and is painful.  He also reports the swelling has increased from the scrotum up into the lower abdominal area.  In the emergency department, the patient was sent  for CT scan of the abdomen and pelvis which reveals continued pancreatitis but no evidence of abscess, pancreatic necrosis or pseudocyst; similar findings to previous exam on Jul 16, 2010.  The pelvic portion of the CT scan reveals a large umbilical hernia containing fat which is stable, a right inguinal hernia is present containing fluid and a right-sided hydrocele.  PAST MEDICAL HISTORY:  Significant for resolving acute pancreatitis, hypertriglyceridemia, diabetes mellitus which appears to be insulin dependent, obstructive sleep apnea and on CPAP, morbid obesity, tobacco abuse.  DISCHARGE MEDICATIONS:  His medications on discharge included: 1. Furosemide 40 mg one p.o. daily for 7 days, then to discontinue. 2. Aspirin 81 mg one p.o. daily. 3. Gemfibrozil 1 p.o. b.i.d. before meals. 4. NovoLog insulin 70/30, the dose has not been specified and will be     verified. 5. Nicotine patch 21 mg one patch daily for 30 days. 6. Omeprazole 20 mg one p.o. b.i.d. 7. Oxycodone 5 mg p.o. q.6 h p.r.n. pain. 8. Pravastatin 40 mg p.o. q.h.s.  ALLERGIES:  No known drug allergies.  SOCIAL HISTORY:  The patient is married.  He is a  smoker.  He previously smoked 1 pack of cigarettes daily for close to 20 years.  He has been on a nicotine patch since his hospitalization from Jul 08, 2010.  He denies any recent alcohol usage.  He quit drinking alcohol 10 years ago.  He has no history of illicit drug usage.  FAMILY HISTORY:  Positive for diabetic disease and coronary artery disease.  PHYSICAL EXAMINATION FINDINGS:  GENERAL:  This is a morbidly obese, older than stated age appearing 35 year old Caucasian male who is in discomfort but he is in no acute distress. VITAL SIGNS:  His vital signs are temperature max 103.0, blood pressure 174/94, however, It did decrease to 98/47 and currently is 103/51. Heart rate 112 to 136 and respirations 20 to 32.  O2 saturations 100%. HEENT EXAMINATION:   Normocephalic, atraumatic.  Pupils equally round, reactive to light.  Extraocular movements are intact.  Funduscopic benign.  There is no scleral icterus.  Nares are patent bilaterally. Oropharynx is clear. NECK:  Supple.  Full range of motion.  No thyromegaly, adenopathy or jugular venous distention. CARDIOVASCULAR:  Tachycardiac rate and rhythm.  No murmurs, gallops or rubs appreciated. LUNGS:  Clear to auscultation bilaterally.  No rales, rhonchi or wheezes. ABDOMEN:  Positive bowel sounds, soft and tender in the epigastrium. There is no rebound or guarding.  There is no hepatosplenomegaly. GENITOURINARY:  The patient has normal male genitalia.  He is uncircumcised.  There are no lesions or ulcers that are seen.  The patient has induration in the right scrotal area and edema.  There is also swelling along the inguinal area consistent with an inguinal hernia on the right side. EXTREMITIES:  Without cyanosis, clubbing or edema. NEUROLOGIC EXAMINATION:  Nonfocal.  LABORATORY STUDIES:  White blood cell count 13.7, hemoglobin 13.0, hematocrit 39.9, MCV 92.4, platelets 266, neutrophils 82%, lymphocytes 13%.  Sodium 137, potassium 3.8, chloride 97, carbon dioxide 27, BUN 18, creatinine 0.79, glucose 184, lipase 54, normal range 11 to 59.  Lactic acid level 1.6.  Procalcitonin less than 0.10, glucose 1.31, lactic acid level on repeat testing 0.7.  CT scan of the abdomen and pelvis as mentioned above.  ASSESSMENT:  This is a 35 year old male being admitted with: 1. Abdominal pain. 2. Nausea and vomiting. 3. Resolving acute pancreatitis. 4. Scrotal edema with hydrocele. 5. Right inguinal hernia. 6. Insulin-dependent diabetes mellitus. 7. Febrile illness. 8. Morbid obesity. 9. Obstructive sleep apnea.  PLAN:  The patient will be admitted.  Blood cultures have been performed along with urine culture and the patient has been placed on antibiotic therapy of vancomycin and Zosyn  empirically at this time.  IV fluids have been ordered for fluid resuscitation and antipyretic therapy will also be ordered.  The patient's regular medications have been reviewed and reconciled.  Re-evaluation of the inguinal hernia and the scrotal area by Urology will be requested.  The patient will be placed on sliding scale insulin coverage and his CPAP therapy will be ordered q.h.s..  Further workup will ensue pending results of the patient's clinical course and the patient will be placed on DVT prophylaxis of SCDs for now.     Della Goo, [name]     HJ/MEDQ  D:  07/27/2010  T:  07/27/2010  Job:  [id number]  Electronically Signed by Della Goo [name] on 08/11/2010 06:37:54 AM

## 2010-08-12 LAB — GLUCOSE, CAPILLARY
Glucose-Capillary: 159 mg/dL — ABNORMAL HIGH (ref 70–99)
Glucose-Capillary: 160 mg/dL — ABNORMAL HIGH (ref 70–99)

## 2010-08-12 LAB — CBC
MCH: 28.3 pg (ref 26.0–34.0)
MCHC: 31.3 g/dL (ref 30.0–36.0)
Platelets: 201 10*3/uL (ref 150–400)
RBC: 3.21 MIL/uL — ABNORMAL LOW (ref 4.22–5.81)
RDW: 13.6 % (ref 11.5–15.5)

## 2010-08-13 LAB — COMPREHENSIVE METABOLIC PANEL
Alkaline Phosphatase: 146 U/L — ABNORMAL HIGH (ref 39–117)
BUN: 14 mg/dL (ref 6–23)
CO2: 27 mEq/L (ref 19–32)
Chloride: 100 mEq/L (ref 96–112)
GFR calc Af Amer: >60 mL/min (ref >60)
GFR calc non Af Amer: >60 mL/min (ref >60)
Glucose, Bld: 141 mg/dL — ABNORMAL HIGH (ref 70–99)
Potassium: 4 mEq/L (ref 3.5–5.1)
Total Bilirubin: 0.5 mg/dL (ref 0.3–1.2)
Total Protein: 6.7 g/dL (ref 6.0–8.3)

## 2010-08-13 LAB — MAGNESIUM: Magnesium: 2.1 mg/dL (ref 1.5–2.5)

## 2010-08-13 LAB — GLUCOSE, CAPILLARY
Glucose-Capillary: 142 mg/dL — ABNORMAL HIGH (ref 70–99)
Glucose-Capillary: 148 mg/dL — ABNORMAL HIGH (ref 70–99)

## 2010-08-14 LAB — GLUCOSE, CAPILLARY
Glucose-Capillary: 171 mg/dL — ABNORMAL HIGH (ref 70–99)
Glucose-Capillary: 158 mg/dL — ABNORMAL HIGH (ref 70–99)
Glucose-Capillary: 179 mg/dL — ABNORMAL HIGH (ref 70–99)

## 2010-08-14 LAB — CBC
HCT: 28.9 % — ABNORMAL LOW (ref 39.0–52.0)
Hemoglobin: 8.8 g/dL — ABNORMAL LOW (ref 13.0–17.0)
MCH: 27.5 pg (ref 26.0–34.0)
MCHC: 30.4 g/dL (ref 30.0–36.0)
RBC: 3.2 MIL/uL — ABNORMAL LOW (ref 4.22–5.81)

## 2010-08-15 LAB — PHOSPHORUS: Phosphorus: 5.1 mg/dL — ABNORMAL HIGH (ref 2.3–4.6)

## 2010-08-15 LAB — GLUCOSE, CAPILLARY
Glucose-Capillary: 131 mg/dL — ABNORMAL HIGH (ref 70–99)
Glucose-Capillary: 137 mg/dL — ABNORMAL HIGH (ref 70–99)
Glucose-Capillary: 147 mg/dL — ABNORMAL HIGH (ref 70–99)

## 2010-08-15 LAB — BASIC METABOLIC PANEL
BUN: 13 mg/dL (ref 6–23)
CO2: 30 mEq/L (ref 19–32)
Chloride: 99 mEq/L (ref 96–112)
Creatinine, Ser: 0.63 mg/dL (ref 0.4–1.5)
GFR calc Af Amer: >60 mL/min (ref >60)
Potassium: 4.1 mEq/L (ref 3.5–5.1)

## 2010-08-16 LAB — COMPREHENSIVE METABOLIC PANEL
ALT: 39 U/L (ref 0–53)
Albumin: 2.1 g/dL — ABNORMAL LOW (ref 3.5–5.2)
Alkaline Phosphatase: 285 U/L — ABNORMAL HIGH (ref 39–117)
Chloride: 100 mEq/L (ref 96–112)
Potassium: 3.8 mEq/L (ref 3.5–5.1)
Sodium: 136 mEq/L (ref 135–145)
Total Protein: 6.3 g/dL (ref 6.0–8.3)

## 2010-08-16 LAB — GLUCOSE, CAPILLARY
Glucose-Capillary: 129 mg/dL — ABNORMAL HIGH (ref 70–99)
Glucose-Capillary: 142 mg/dL — ABNORMAL HIGH (ref 70–99)
Glucose-Capillary: 132 mg/dL — ABNORMAL HIGH (ref 70–99)
Glucose-Capillary: 160 mg/dL — ABNORMAL HIGH (ref 70–99)

## 2010-08-17 LAB — COMPREHENSIVE METABOLIC PANEL
Albumin: 2.1 g/dL — ABNORMAL LOW (ref 3.5–5.2)
BUN: 11 mg/dL (ref 6–23)
Chloride: 99 mEq/L (ref 96–112)
Creatinine, Ser: 0.64 mg/dL (ref 0.50–1.35)
GFR calc non Af Amer: >60 mL/min (ref >60)
Total Bilirubin: 0.6 mg/dL (ref 0.3–1.2)

## 2010-08-17 LAB — CBC
HCT: 30.2 % — ABNORMAL LOW (ref 39.0–52.0)
MCV: 90.1 fL (ref 78.0–100.0)
RDW: 14.1 % (ref 11.5–15.5)
WBC: 6.7 10*3/uL (ref 4.0–10.5)

## 2010-08-17 LAB — LIPASE, BLOOD: Lipase: 32 U/L (ref 11–59)

## 2010-08-17 LAB — GLUCOSE, CAPILLARY
Glucose-Capillary: 154 mg/dL — ABNORMAL HIGH (ref 70–99)
Glucose-Capillary: 170 mg/dL — ABNORMAL HIGH (ref 70–99)

## 2010-08-17 LAB — DIFFERENTIAL
Eosinophils Relative: 2 % (ref 0–5)
Lymphocytes Relative: 29 % (ref 12–46)
Lymphs Abs: 2 10*3/uL (ref 0.7–4.0)
Monocytes Absolute: 0.5 10*3/uL (ref 0.1–1.0)
Monocytes Relative: 7 % (ref 3–12)

## 2010-08-18 LAB — GLUCOSE, CAPILLARY
Glucose-Capillary: 120 mg/dL — ABNORMAL HIGH (ref 70–99)
Glucose-Capillary: 159 mg/dL — ABNORMAL HIGH (ref 70–99)
Glucose-Capillary: 121 mg/dL — ABNORMAL HIGH (ref 70–99)

## 2010-08-19 ENCOUNTER — Inpatient Hospital Stay (HOSPITAL_COMMUNITY): Payer: Medicaid Other

## 2010-08-19 LAB — COMPREHENSIVE METABOLIC PANEL
Albumin: 2.2 g/dL — ABNORMAL LOW (ref 3.5–5.2)
Alkaline Phosphatase: 348 U/L — ABNORMAL HIGH (ref 39–117)
BUN: 12 mg/dL (ref 6–23)
CO2: 27 mEq/L (ref 19–32)
Chloride: 101 mEq/L (ref 96–112)
Creatinine, Ser: 0.63 mg/dL (ref 0.50–1.35)
GFR calc Af Amer: >60 mL/min (ref >60)
GFR calc non Af Amer: >60 mL/min (ref >60)
Glucose, Bld: 106 mg/dL — ABNORMAL HIGH (ref 70–99)
Potassium: 4.1 mEq/L (ref 3.5–5.1)
Total Bilirubin: 0.4 mg/dL (ref 0.3–1.2)

## 2010-08-19 LAB — GLUCOSE, CAPILLARY
Glucose-Capillary: 133 mg/dL — ABNORMAL HIGH (ref 70–99)
Glucose-Capillary: 144 mg/dL — ABNORMAL HIGH (ref 70–99)

## 2010-08-19 LAB — PHOSPHORUS: Phosphorus: 4.9 mg/dL — ABNORMAL HIGH (ref 2.3–4.6)

## 2010-08-20 ENCOUNTER — Inpatient Hospital Stay (HOSPITAL_COMMUNITY): Payer: Medicaid Other

## 2010-08-20 LAB — COMPREHENSIVE METABOLIC PANEL
ALT: 31 U/L (ref 0–53)
Alkaline Phosphatase: 401 U/L — ABNORMAL HIGH (ref 39–117)
BUN: 14 mg/dL (ref 6–23)
CO2: 27 mEq/L (ref 19–32)
Calcium: 8.6 mg/dL (ref 8.4–10.5)
GFR calc Af Amer: >60 mL/min (ref >60)
GFR calc non Af Amer: >60 mL/min (ref >60)
Glucose, Bld: 110 mg/dL — ABNORMAL HIGH (ref 70–99)
Sodium: 135 mEq/L (ref 135–145)
Total Protein: 6.9 g/dL (ref 6.0–8.3)

## 2010-08-20 LAB — DIFFERENTIAL
Lymphocytes Relative: 22 % (ref 12–46)
Lymphs Abs: 1.7 10*3/uL (ref 0.7–4.0)
Monocytes Absolute: 0.5 10*3/uL (ref 0.1–1.0)
Monocytes Relative: 6 % (ref 3–12)
Neutro Abs: 5.4 10*3/uL (ref 1.7–7.7)
Neutrophils Relative %: 69 % (ref 43–77)

## 2010-08-20 LAB — MAGNESIUM: Magnesium: 1.8 mg/dL (ref 1.5–2.5)

## 2010-08-20 LAB — CBC
HCT: 29.8 % — ABNORMAL LOW (ref 39.0–52.0)
Hemoglobin: 9.2 g/dL — ABNORMAL LOW (ref 13.0–17.0)
MCH: 28 pg (ref 26.0–34.0)
MCHC: 30.9 g/dL (ref 30.0–36.0)
RBC: 3.29 MIL/uL — ABNORMAL LOW (ref 4.22–5.81)

## 2010-08-20 LAB — GLUCOSE, CAPILLARY
Glucose-Capillary: 98 mg/dL (ref 70–99)
Glucose-Capillary: 107 mg/dL — ABNORMAL HIGH (ref 70–99)
Glucose-Capillary: 137 mg/dL — ABNORMAL HIGH (ref 70–99)

## 2010-08-20 MED ORDER — IOHEXOL 300 MG/ML  SOLN
125.0000 mL | Freq: Once | INTRAMUSCULAR | Status: AC | PRN
  Administered 2010-08-20: 125 mL via INTRAVENOUS

## 2010-08-21 LAB — GLUCOSE, CAPILLARY
Glucose-Capillary: 104 mg/dL — ABNORMAL HIGH (ref 70–99)
Glucose-Capillary: 128 mg/dL — ABNORMAL HIGH (ref 70–99)
Glucose-Capillary: 131 mg/dL — ABNORMAL HIGH (ref 70–99)
Glucose-Capillary: 148 mg/dL — ABNORMAL HIGH (ref 70–99)

## 2010-08-22 LAB — GLUCOSE, CAPILLARY
Glucose-Capillary: 122 mg/dL — ABNORMAL HIGH (ref 70–99)
Glucose-Capillary: 122 mg/dL — ABNORMAL HIGH (ref 70–99)

## 2010-08-22 LAB — PHOSPHORUS: Phosphorus: 4.8 mg/dL — ABNORMAL HIGH (ref 2.3–4.6)

## 2010-08-23 LAB — CBC
HCT: 30.8 % — ABNORMAL LOW (ref 39.0–52.0)
Hemoglobin: 9.3 g/dL — ABNORMAL LOW (ref 13.0–17.0)
MCH: 27 pg (ref 26.0–34.0)
MCHC: 30.2 g/dL (ref 30.0–36.0)
MCV: 89.5 fL (ref 78.0–100.0)
RDW: 14.1 % (ref 11.5–15.5)

## 2010-08-23 LAB — COMPREHENSIVE METABOLIC PANEL
Albumin: 2.3 g/dL — ABNORMAL LOW (ref 3.5–5.2)
Alkaline Phosphatase: 271 U/L — ABNORMAL HIGH (ref 39–117)
BUN: 14 mg/dL (ref 6–23)
Calcium: 8.3 mg/dL — ABNORMAL LOW (ref 8.4–10.5)
Creatinine, Ser: 0.66 mg/dL (ref 0.50–1.35)
GFR calc Af Amer: >60 mL/min (ref >60)
Glucose, Bld: 134 mg/dL — ABNORMAL HIGH (ref 70–99)
Potassium: 4 mEq/L (ref 3.5–5.1)
Total Protein: 7.1 g/dL (ref 6.0–8.3)

## 2010-08-23 LAB — GLUCOSE, CAPILLARY: Glucose-Capillary: 98 mg/dL (ref 70–99)

## 2010-08-23 LAB — MAGNESIUM: Magnesium: 2 mg/dL (ref 1.5–2.5)

## 2010-08-23 LAB — PHOSPHORUS: Phosphorus: 4.9 mg/dL — ABNORMAL HIGH (ref 2.3–4.6)

## 2010-08-24 LAB — BASIC METABOLIC PANEL
Chloride: 99 mEq/L (ref 96–112)
GFR calc Af Amer: >60 mL/min (ref >60)
GFR calc non Af Amer: >60 mL/min (ref >60)
Glucose, Bld: 124 mg/dL — ABNORMAL HIGH (ref 70–99)
Potassium: 4.1 mEq/L (ref 3.5–5.1)
Sodium: 133 mEq/L — ABNORMAL LOW (ref 135–145)

## 2010-08-24 LAB — GLUCOSE, CAPILLARY: Glucose-Capillary: 103 mg/dL — ABNORMAL HIGH (ref 70–99)

## 2010-08-25 LAB — GLUCOSE, CAPILLARY: Glucose-Capillary: 96 mg/dL (ref 70–99)

## 2010-08-28 NOTE — Discharge Summary (Signed)
[name], [name] NO.:  [id number]  MEDICAL RECORD NO.:  [id number]  LOCATION:  1519                         FACILITY:  [facility]  PHYSICIAN:  Hillery Aldo, [name]   DATE OF BIRTH:  Aug 11, 1975  DATE OF ADMISSION:  07/27/2010 DATE OF DISCHARGE:  08/25/2010                              DISCHARGE SUMMARY   PRIMARY CARE PHYSICIAN:  Dr. Andrey Campanile at [facility]  GASTROENTEROLOGIST:  Willis Modena, MD  UROLOGIST:  Excell Seltzer. Annabell Howells, [name]  DISCHARGE DIAGNOSES: 1. Severe recurrent pancreatitis with pseudocysts. 2. Hypertriglyceridemia contributing to number #1. 3. Intractable nausea with vomiting. 4. Type 2 diabetes. 5. Obstructive sleep apnea. 6. Right scrotal abscess status post orchiectomy and partial     omentectomy on Jul 28, 2010, by Dr. Annabell Howells 7. Obesity. 8. Tobacco abuse. 9. 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  DISCHARGE MEDICATIONS: 1. Oxycodone 10 mg p.o. q.4 h p.r.n. pain. 2. Aspirin 81 mg p.o. daily. 3. Furosemide 40 mg p.o. daily. 4. Gemfibrozil 600 mg p.o. b.i.d. 5. Nicotine patch 21 mg transdermally daily. 6. Novolin insulin 70/30, 30 units subcutaneously b.i.d. (Note:  The     patient instructed to start with 15 units b.i.d. until his appetite     is normal and he is eating food at the level he was preadmission     and to titrate his 70/30 up to 30 units twice a day based on his     food intake and glycemic control). 7. Omeprazole 20 mg p.o. b.i.d. 8. Multivitamin 1 tablet p.o. daily. 9. Pravastatin 40 mg p.o. q.h.s.  CONSULTATIONS: 1. Dr. Claud Kelp of General Surgery. 2. Dr. Bjorn Pippin with Urology. 3. Dr. Willis Modena with Gastroenterology 4. Physical Therapy. 5. Occupational Therapy. 6. Pharmacy for TNA dosing.  BRIEF ADMISSION HPI:  The patient is a 35 year old male with past medical history of familial  hypertriglyceridemia who presented to the hospital with chief complaint of abdominal pain as well as worsening swelling in the scrotal area.  Prehospitalization, the patient had been treated for bilateral epididymitis by Urology.  He has a known history of recurrent pancreatitis and for these reasons, he was referred to the hospitalist service for further evaluation and treatment.  For the full details, please see the dictated report done by Dr. Lovell Sheehan.  PROCEDURES AND DIAGNOSTIC STUDIES: 1. CT scan of the abdomen and pelvis Jul 27, 2010, showed a stable     umbilical hernia and right inguinal hernia.  Right inguinal hernia     containing fluid.  Moderate to large right hydrocele.  Partially     imaged in the lower abdomen is fluid anterior to a small bowel     loop.  This could represent developing pseudocyst but is     incompletely visualized and evaluated on CT of the pelvis.     Findings of acute pancreatitis and peripancreatic acute fluid     collections similar to prior exam from Jul 16, 2010.  No abscess,     pancreatic necrosis, or well-defined pseudocyst with enhancing     margins currently seen.  Prominent lumbar spondylosis. 2. Right scrotal exploration with hydrocelectomy and right inguinal     orchiectomy performed by Dr. Bjorn Pippin on Jul 28, 2010 with     postoperative diagnosis of right hydrocele with right spermatic     cord abscess and fat necrosis possibly secondary to acute     pancreatitis. 3. Partial omentectomy and surgical assistance for right orchiectomy     provided by Dr. Claud Kelp on Jul 28, 2010. 4. KUB on August 08, 2010, was limited due to body habitus and portable     technique but no definite findings for small bowel obstruction or     free air were noted. 5. CT scan of the abdomen on August 09, 2010, showed evolving subacute     pancreatitis with multiple slightly better defined peripancreatic     fluid collections most consistent with developing  pseudocysts.  No     well-defined collections identified.  No definite signs of     pancreatic necrosis or acute hemorrhage.  Extrinsic mass effect on     the splenic vein and portal confluence.  No evidence of large     vessel occlusion.  Mildly progressive biliary dilatation, likely     secondary to light [facility] to pancreatic inflammation. 6. CT scan of the abdomen on August 20, 2010, showed acute pancreatitis     showing overall improvement from prior study.  Developing     pancreatic pseudocysts adjacent to the pancreatic body and tail,     largest measuring 5 cm.  These show interval decrease in size from     prior study.  No enlarging or new peripancreatic fluid collections     identified.  Stable appearance of splenic vein thrombosis and mild     splenomegaly.  Stable mild biliary ductal dilatation.  Probable     perfusion abnormality in the anterior right hepatic lobe although     hepatic mass cannot definitely be excluded.  Followup CT or further     evaluation with outpatient MRI recommended.  DISCHARGE LABORATORY VALUES:  Sodium was 133, potassium 4.1, chloride 99, bicarb 29, BUN 14, creatinine 0.66, glucose 124, calcium 8.5, phosphorous 4.9, magnesium 2.0.  Liver function studies:  Total bilirubin 0.3, alkaline phosphatase 271, AST 15, ALT 19, total protein 7.1, albumin 2.3, triglycerides 119.  HOSPITAL COURSE BY PROBLEM: 1. Refractory pancreatitis secondary to hypertriglyceridemia:  The     patient was admitted and put on bowel rest.  Attempts at receding     the patient with placement of a nasogastric tube were unsuccessful     and ultimately the patient did require total nutritional admixture     parenterally for nutritional support.  The patient was maintained     on total nutritional support from Aug 01, 2010, until August 24, 2010.  The total nutritional admixture support was gradually     titrated and weaned down 48 hours prior to discharge with complete      discontinuation on August 24, 2010.  As the patient's nutritional     support was withdrawn, his diet was advanced and at this point, he     is able to tolerate solid foods with no recurrence of abdominal     pain, nausea, or vomiting.  The patient's pancreatitis is felt to     be due to a hypertriglyceridemia likely familial and he has been     maintained on Lopid while in the hospital.  He will need  to     continue on this post discharge and have a followup CT scan as     indicated by Radiology for further evaluation of a perfusion     abnormality seen in the anterior right hepatic lobe to rule out     hepatic mass in the next 3 months. 2. Right scrotal abscess with entrapped omentum, fat necrosis, and     hydrocele status post right scrotal exploration with hydrocelectomy     and right inguinal orchiectomy as well as partial omentectomy:  The     patient complained of scrotal pain on presentation and this     prompted emergent urology consultation.  The patient was seen by     Dr. Bjorn Pippin with plans for surgical exploration which was     performed on Jul 28, 2010.  The patient's postoperative course has     been unremarkable.  Cultures done of the abscess fluid remained     sterile and he was treated with vancomycin and Zosyn for over 1     week.  The patient had no return of symptoms with discontinuation     of antibiotic therapy and he continues to receive wound care     directed by the wound care recommendations which include saline     rinses twice a day with saline packed gauze to the wound defect     covered with an ABD pad and mesh underwear.  We will set up home     health nursing for ongoing support with his wound care post     discharge. 3. Intractable nausea and vomiting:  Felt to be secondary to     pancreatitis.  This has now resolved.  It was treated with bowel     rest and Reglan.  The Reglan has now been discontinued. 4. Type 2 diabetes:  The patient's diabetes was  well controlled in the     hospital.  He was on 70/30 insulin prior to admission and has been     advised to resume this but to start with half his normal home dose     and to titrate this up based on his sugars and based on resumption     of his diet to preadmission dietary intake. 5. Obstructive sleep apnea:  The patient was maintained on CPAP q.h.s. 6. Obesity:  The patient was seen by the dietitian while in the     hospital. 7. Tobacco abuse:  The patient is provided with a nicotine patch and     counseled.  DISPOSITION:  The patient is medically stable and will be discharged home with home health care nursing support for ongoing wound care.  DISCHARGE INSTRUCTIONS:  ACTIVITY:  Increase activity slowly.  DIET:  Diabetic.  WOUND CARE:  Cleanse with normal saline, soak wound with normal saline soak gauze, top with ABD pad and wear mesh underwear to keep dressing in place.  Stop smoking.  Follow up with Dr. Andrey Campanile at [facility] on September 11, 2010, at 2:00 p.m.  Follow up with Dr. Willis Modena with [facility] in 3 to 4 weeks, call for appointment.  Follow up with Dr. Bjorn Pippin of Urology in 2 to 3 weeks, call for appointment.  CONDITION ON DISCHARGE:  Improved.  Time spent coordinating care for discharge and discharge instructions including face-to-face time equals approximately 40 minutes.     Hillery Aldo, [name] CR/MEDQ  D:  08/25/2010  T:  08/25/2010  Job:  [id number]  cc:   Dr. Andrey Campanile at [facility], MD Fax: [phone]  Excell Seltzer. Annabell Howells, [name] Fax: [phone]  Electronically Signed by Hillery Aldo [name] on 08/28/2010 06:03:18 PM

## 2010-08-29 ENCOUNTER — Encounter: Payer: Self-pay | Attending: Internal Medicine | Admitting: Dietician

## 2010-08-29 DIAGNOSIS — Z713 Dietary counseling and surveillance: Secondary | ICD-10-CM | POA: Insufficient documentation

## 2010-08-29 DIAGNOSIS — E109 Type 1 diabetes mellitus without complications: Secondary | ICD-10-CM | POA: Insufficient documentation

## 2010-09-12 ENCOUNTER — Emergency Department (HOSPITAL_COMMUNITY)
Admission: EM | Admit: 2010-09-12 | Discharge: 2010-09-13 | Disposition: A | Discharge status: Home / Self Care | Payer: Self-pay | Attending: Emergency Medicine | Admitting: Emergency Medicine

## 2010-09-12 DIAGNOSIS — R112 Nausea with vomiting, unspecified: Secondary | ICD-10-CM | POA: Insufficient documentation

## 2010-09-12 DIAGNOSIS — E119 Type 2 diabetes mellitus without complications: Secondary | ICD-10-CM | POA: Insufficient documentation

## 2010-09-12 DIAGNOSIS — R109 Unspecified abdominal pain: Secondary | ICD-10-CM | POA: Insufficient documentation

## 2010-09-12 DIAGNOSIS — Z794 Long term (current) use of insulin: Secondary | ICD-10-CM | POA: Insufficient documentation

## 2010-09-12 LAB — URINE MICROSCOPIC-ADD ON

## 2010-09-12 LAB — DIFFERENTIAL
Basophils Absolute: 0 10*3/uL (ref 0.0–0.1)
Basophils Relative: 0 % (ref 0–1)
Monocytes Absolute: 0.3 10*3/uL (ref 0.1–1.0)
Neutro Abs: 6.3 10*3/uL (ref 1.7–7.7)
Neutrophils Relative %: 76 % (ref 43–77)

## 2010-09-12 LAB — URINALYSIS, ROUTINE W REFLEX MICROSCOPIC
Glucose, UA: NEGATIVE mg/dL
Hgb urine dipstick: NEGATIVE
Specific Gravity, Urine: 1.029 (ref 1.005–1.030)
Urobilinogen, UA: 1 mg/dL (ref 0.0–1.0)
pH: 5.5 (ref 5.0–8.0)

## 2010-09-12 LAB — CBC
Hemoglobin: 11.4 g/dL — ABNORMAL LOW (ref 13.0–17.0)
MCHC: 31.7 g/dL (ref 30.0–36.0)

## 2010-09-13 LAB — COMPREHENSIVE METABOLIC PANEL
ALT: 12 U/L (ref 0–53)
AST: 16 U/L (ref 0–37)
Albumin: 3.6 g/dL (ref 3.5–5.2)
Alkaline Phosphatase: 92 U/L (ref 39–117)
GFR calc Af Amer: >60 mL/min (ref >60)
Glucose, Bld: 133 mg/dL — ABNORMAL HIGH (ref 70–99)
Potassium: 3.4 mEq/L — ABNORMAL LOW (ref 3.5–5.1)
Sodium: 137 mEq/L (ref 135–145)
Total Protein: 8.6 g/dL — ABNORMAL HIGH (ref 6.0–8.3)

## 2010-09-13 NOTE — Progress Notes (Signed)
  NAMESERAPIO, EDELSON NO.:  [id number]  MEDICAL RECORD NO.:  [id number]  LOCATION:  1519                         FACILITY:  [facility]  PHYSICIAN:  Brendia Sacks, MD    DATE OF BIRTH:  1975/10/20                                PROGRESS NOTE   This is an interim progress note on [name] covering the dates from August 15, 2010, to the present day, August 21, 2010.  INTERIM SUMMARY: This is a 35 year old man, who presented to the emergency room with recurrent pancreatitis.  His hospitalization was complicated by a right scrotal abscess and he underwent surgical exploration for this and orchiectomy, and partial omentectomy.  That is no longer an active issue; however, his pancreatitis was quite severe and he was on bowel rest for some time.  He was unable to tolerate needs of gastric feedings and so was placed on TNA as per GI's recommendations.  The patient's hospital course is nicely summarized by Dr. Terrace Arabia note from June 12.  Interim details are as follows:  The patient has had an uneventful course over the last 7 days.  His nausea has improved.  His pain has decreased.  He has been weaned off his PCA pump.  Repeat CT scan obtained yesterday demonstrated improvement in acute pancreatitis. Developing pancreatic pseudocysts showed interval decrease in size and there was no enlarging or new peripancreatic fluid collections identified.  There was mild biliary ductal dilatation, which was stable. A perfusion abnormality was seen in the right hepatic lobe, but no mass identified.  This could be followed up with CT.  At this point, per Gastroenterology, the plan is to advance the patient's diet.  Note that in the past, he had refractory pancreatitis that made advancing his diet in the hospital.  If he is able to tolerate advance diet over the next several days, TNA will be decreased and then discontinued and discharge is hopefully anticipated in the next 4 or  5 days.  His diet has improved.  He has been restarted on his gemfibrozil for his hypertriglyceridemia.  His diabetes has been stable on his current regimen.     Brendia Sacks, MD     DG/MEDQ  D:  08/21/2010  T:  08/21/2010  Job:  [id number]  Electronically Signed by Brendia Sacks  on 09/13/2010 11:39:53 AM

## 2010-09-17 NOTE — Consult Note (Signed)
NAMETEDDRICK, MALLARI                ACCOUNT NO.:  [id number]  MEDICAL RECORD NO.:  [id number]           PATIENT TYPE:  I  LOCATION:  1519                         FACILITY:  [facility]  PHYSICIAN:  Willis Modena, MD     DATE OF BIRTH:  December 05, 1975  DATE OF CONSULTATION:  08/02/2010 DATE OF DISCHARGE:                                CONSULTATION   REASON FOR CONSULTATION:  Refractory pancreatitis.  CHIEF COMPLAINT:  Abdominal pain.  HISTORY OF PRESENT ILLNESS:  Mr. Beaulieu is a 35 year old gentleman, who was admitted to the hospital for abdominal pain and scrotal swelling. He has a history of poorly controlled diabetes and was ultimately diagnosed with a scrotal abscess that required right-sided orchiectomy. He was also found to have pancreatitis as manifested by lipase greater than 2000 as well as CT scan showing peripancreatic fluid and edema (without necrosis), all compatible with pancreatitis.  He had a prior admission for pancreatitis and epididymitis a couple weeks ago.  Prior to that time, however, Mr. Bann denies any problems with pancreatitis or significant stomach troubles.  He denies drinking for the past 10 years.  Transabdominal ultrasound shows no evidence of gallstones.  At present, Mr. Goodie is unable to tolerate any type of diet without getting significant abdominal pain and nausea, vomiting.  He has accordingly been reverted back to n.p.o. diet status and has been placed on a PCA pump.  He was started on TPN yesterday.  His abdominal pain has somewhat improved, but not completely resolved with these measures.  Past medical history, past surgical history, home medications, allergies, family history, social history, review of systems all from dictated note from Dr. Della Goo, dated Jul 27, 2010; I reviewed and I agree.  PHYSICAL EXAMINATION:  VITAL SIGNS:  Blood pressure 141/80, heart rate 95, respiratory rate 20, temperature 98.9, oxygen saturation 94% on  room air. GENERAL:  Mr. Weathers is obese, older appearing than stated age, not acutely toxic. HEENT:  ENT:  Mucous membranes slightly dry.  No oropharyngeal lesions. EYES:  Sclerae anicteric.  Conjunctivae are pink. NECK:  Thick, but supple. SKIN:  Scattered tattoos.  No rash or ecchymoses. LUNGS:  Diminished breath sounds, but otherwise clear. HEART:  Regular. ABDOMEN:  Obese.  Mild epigastric tenderness.  Bowel sounds are present. No peritonitis.  No Murphy sign. EXTREMITIES:  No peripheral cyanosis or clubbing.  He has some nonpitting edema in bilateral lower extremities. NEUROLOGIC:  Diffusely weak, but nonfocal without lateralizing signs. PSYCHIATRIC:  Flat affect, depressed mood. LYMPHATICS:  No obviously palpable axillary, submandibular, supraclavicular adenopathy.  RADIOLOGIC STUDIES:  CT of abdomen, pelvis on Jul 27, 2010, with contrast I have personally reviewed, shows pancreatitis with peripancreatic edema without evidence of abscess, pseudocyst, or necrosis.  Transabdominal ultrasound dated Jul 08, 2010, as mentioned before, shows no cholecystitis, no gallstones, does show fatty liver.  LABORATORY STUDIES:  White count 5.9, hemoglobin 9.5, platelet count 185.  Sodium 138, potassium 3.2, chloride 101, bicarb 31, BUN 3, creatinine 0.5.  Liver tests are normal except for low albumin of 2.1. Cholesterol currently is 175, about 3 weeks ago it was over  2000. Lipase currently normal, was 2000 a few weeks ago.  IMPRESSION:  Mr. Bonzo is a 35 year old gentleman presenting with scrotal abscess as well as refractory pancreatitis.  I feels pancreatitis is likely related to hypertriglyceridemia which is likely in turn related to some type of familial hyperlipidemia syndrome.  He has refractory pain to even sips of liquids requiring PCA pump and alternative means for nutrition.  No evidence of necrosis on recent CT scan.  PLAN: 1. Would continue n.p.o. status today.  Could consider  sips of clears     tomorrow. 2. I would be reluctant to give him more than clear liquids while he     is still actually on a PCA pump, however. 3. Given his poorly controlled diabetes and multiple other     comorbidities, I feel strongly that nasojejunal antral     hyperalimentation is the preferred short-term nutritional route for     him, instead of TPN.  Thus, it is not sure that he could tolerate a     nasojejunal tube.  I have explained that the PICC line for his TPN     source, especially given his poorly controlled diabetes, is at high     risk for developing infection and multiple other complications.  He     will think about this and we will revisit tomorrow. 4. Regardless of any nutrition type (TPN versus nasojejunal feed),     Ilija will likely 3 to 6 weeks of non by mouth nutritional     supplementation, some of which may very well likely need to be     administered at home, depending on when he is fit for discharge.  Thanks again for allowing me to participate in Mr. Kitamura's care.     Willis Modena, MD     WO/MEDQ  D:  08/02/2010  T:  08/02/2010  Job:  [id number]  Electronically Signed by Willis Modena  on 09/17/2010 01:15:27 PM

## 2010-10-16 NOTE — Progress Notes (Signed)
NAMEJEMARCUS, DOUGAL                ACCOUNT NO.:  [id number]  MEDICAL RECORD NO.:  [id number]           PATIENT TYPE:  I  LOCATION:  1519                         FACILITY[facility]  [facility]  PHYSICIAN:  Altha Harm, MDDATE OF BIRTH:  [birth year]                                PROGRESS NOTE   SUMMARY/PROGRESS NOTE:  DATE OF DISCHARGE: Not yet determined.  PRIMARY CARE PHYSICIAN: Insurance underwriter.  CURRENT DIAGNOSES: 1. Acute recurrent pancreatitis, likely secondary to high     triglycerides. 2. Abdominal pain. 3. Diabetes type 2, insulin-dependent. 4. Obstructive sleep apnea. 5. Tobacco use disorder. 6. Body mass index greater than 40/morbid obesity. 7. Hypertriglyceridemia. 8. Scrotal abscess, status post incision and drainage.  MEDICATIONS: To be dictated at time of discharge.  CONSULTANTS: 1. [facility]. 2. [facility]. 3. Urology.  DIAGNOSTIC STUDIES: 1. CT abdomen and pelvis with contrast, impression:     a.     Stable umbilical hernia and right inguinal hernia.  Right      inguinal hernia contains fluid.  Moderate to large right      hydrocele.  Partially imaged in the right lower abdomen is fluid      anterior to the small-bowel loop.  This may represent developing      pseudocyst but it is incompletely visualized and evaluated on the      CT pelvis only.     b.     Findings of acute pancreatitis with peri-pancreatic acute      fluid collections similar to the prior exam on 05/14.  No abscess,      pancreatic necrosis or well-defined pseudocyst with enhancing      margin currently seen.     c.     Prominent lumbar spondylosis. 2. One-view chest x-ray done on 25th which shows low lung volumes and     a left base atelectasis.  CODE STATUS: Full code.  ALLERGIES: No known drug allergies.  CHIEF COMPLAINT: Abdominal pain.  HISTORY OF PRESENT ILLNESS: Mr. Lanum is a 35 year old gentleman who presents to the emergency room with  a worsening abdominal pain over 24 hours.  The patient also was complaining of increased swelling of the scrotum.  Please note that the patient had recently been hospitalized and was seen by Urology who treated with imipenem for bilateral epididymitis and for acute pancreatitis.  PHYSICAL EXAMINATION: GENERAL:  The patient has a rather flat affect and does not volunteer much verbalization unless questioned. VITAL SIGNS:  Temperature is 99.4, heart rate 79, blood pressure 141/87, respiratory rate 18, O2 saturations are 94% on 2 L.  HEENT: Normocephalic, atraumatic.  Pupils equally round and reactive to light and accommodation.  Extraocular movements are intact.  Oropharynx is moist.  No exudate, erythema or lesions are noted. NECK:  Trachea is midline.  No masses.  No thyromegaly.  No JVD.  No carotid bruit. RESPIRATORY:  Normal respiratory effort with equal excursion bilaterally.  No wheezing or rhonchi noted. CARDIOVASCULAR:  Normal S1 and S2.  No murmurs, rubs or gallops noted. PMI is nondisplaced.  No heaves or thrills on palpation. ABDOMEN:  Obese.  He does have some mild epigastric tenderness.  No masses.  No hepatosplenomegaly is noted. EXTREMITIES:  No clubbing, cyanosis or edema.  ASSESSMENT AND PLAN: 1. Refractory pancreatitis likely secondary to hypertriglyceridemia.     On the CT scan, there is no evidence of pseudocyst, however, the     patient continues to have pain and continues to have nausea and     vomiting.  The patient was initially placed on clear liquids,     however, this was felt to be insufficient.  I consulted     Gastroenterology who agreed that the patient should have complete     bowel rest. Gastroenterology feels that the best course of     treatment for this patient is to place an NG tube past the jejunum     to allow use of the bowel.  The patient, however, was somehow given     the option of NG tube versus TNA and has chosen TNA.  We have     spoken  to the patient on several occasions, including myself and     the gastroenterologist.  The patient is still resistant to the NG     placement.  However gastroenterologist have explained to him that     the plan is to place an NG tube on Monday and to start enteral     feedings. 2. Scrotal abscess and exploration:  The patient had been diagnosed     with bilateral epididymitis and treated on a previous     hospitalization.  He came back with further scrotal swelling and     Surgery and Urology were consulted.  The patient had an exploration     done and fluid was sent for culture.  Fluid was sterile and grew no     pathogens.  The patient has been having wet-to-dry dressings and     both Urology and Surgery have signed off.  They recommended the     patient follow up with them as an outpatient and the patient have     home care to do the dressing changes when he does go home. 3. Tobacco use disorder:  The patient is on a nicotine patch. 4. Hypertriglyceridemia:  The patient does have high triglycerides and     is being treated for it with both pravastatin and Lescol, however,     due to the complete bowel rest, these had to be discontinued.     However, it is very important that once the patient is able to eat,     that he resumes treatment for this as this seems to be a huge     contributory factor to his pancreatitis.  Otherwise, the patient     has been stable. 5. In terms of his diabetes, the patient is receiving sliding scale     insulin and his insulin is being added to his TNA in order to     control his blood sugars. 6. Hypokalemia:  The patient did have some decreased potassium likely     due to emesis and poor oral intake.  Potassium was replaced by IV     and is being added to his TNA.  This brings [place] up to date on this patient's current medical condition.     Altha Harm, MD     MAM/MEDQ  D:  08/04/2010  T:  08/04/2010  Job:  [id number]  Electronically Signed  by Marthann Schiller MD on 10/16/2010 08:39:54  PM

## 2010-11-20 ENCOUNTER — Other Ambulatory Visit (HOSPITAL_COMMUNITY): Payer: Self-pay | Admitting: Family Medicine

## 2010-11-20 DIAGNOSIS — K859 Acute pancreatitis without necrosis or infection, unspecified: Secondary | ICD-10-CM

## 2010-11-20 DIAGNOSIS — K863 Pseudocyst of pancreas: Secondary | ICD-10-CM

## 2010-11-21 ENCOUNTER — Ambulatory Visit (HOSPITAL_BASED_OUTPATIENT_CLINIC_OR_DEPARTMENT_OTHER): Payer: Self-pay | Attending: Internal Medicine

## 2010-11-21 ENCOUNTER — Ambulatory Visit (HOSPITAL_BASED_OUTPATIENT_CLINIC_OR_DEPARTMENT_OTHER): Payer: Medicaid Other

## 2010-11-21 DIAGNOSIS — G4730 Sleep apnea, unspecified: Secondary | ICD-10-CM | POA: Insufficient documentation

## 2010-11-21 DIAGNOSIS — G4710 Hypersomnia, unspecified: Secondary | ICD-10-CM | POA: Insufficient documentation

## 2010-11-21 DIAGNOSIS — R259 Unspecified abnormal involuntary movements: Secondary | ICD-10-CM | POA: Insufficient documentation

## 2010-11-22 ENCOUNTER — Other Ambulatory Visit (HOSPITAL_COMMUNITY): Payer: Medicaid Other

## 2010-11-25 DIAGNOSIS — G4761 Periodic limb movement disorder: Secondary | ICD-10-CM

## 2010-11-25 DIAGNOSIS — G4730 Sleep apnea, unspecified: Secondary | ICD-10-CM

## 2010-11-25 DIAGNOSIS — G4710 Hypersomnia, unspecified: Secondary | ICD-10-CM

## 2010-11-25 NOTE — Procedures (Signed)
[name], Jason                ACCOUNT NO.:  [id number]  MEDICAL RECORD NO.:  [id number]          PATIENT TYPE:  OUT  LOCATION:  SLEEP CENTER                 FACILITY:  [facility]  PHYSICIAN:  Janita Camberos D. Maple Hudson, MD, FCCP, FACPDATE OF BIRTH:  04/15/1977  DATE OF STUDY:  11/21/2010                           NOCTURNAL POLYSOMNOGRAM  REFERRING PHYSICIAN:  Tresa Endo L. Philipp Deputy, [name]  INDICATION FOR STUDY:  Hypersomnia with sleep apnea.  EPWORTH SLEEPINESS SCORE:  14/24.  BMI 38.6, weight 250 pounds, height 67.5 inches, neck 15.5 inches.  MEDICATIONS:  Home medications are charted and reviewed.  A baseline diagnostic NPSG on January 14, 2005, had documented severe obstructive sleep apnea with an AHI of 54 per hour.  Weight was 330 pounds at that time.  CPAP was titrated to 13 CWP for an AHI of 0 per hour.  CPAP titration is now requested.  SLEEP ARCHITECTURE:  Total sleep time 395.5 minutes with sleep efficiency 97.1%.  Stage I was 3.5%, stage II 71.9%, stage III 0.3%, REM 24.3% of total sleep time.  Sleep latency 5.5 minutes, REM latency 70.5 minutes, awake after sleep onset 7 minutes, arousal index 10.9.  Bedtime medication:  Pravastatin.  RESPIRATORY DATA:  CPAP titration protocol.  CPAP was titrated to 11 CWP, AHI 0 per hour.  He wore a Banker Opus nasal pillows mask with medium-sized pillows, heated humidifier, C-Flex of 3.  OXYGEN DATA:  Snoring was prevented by CPAP and mean oxygen saturation held 95.6% on room air.  CARDIAC DATA:  Normal sinus rhythm.  MOVEMENT-PARASOMNIA:  A total of 72 limb jerks were counted of which 9 were associated with arousal or awakening for periodic limb movement with arousal index of 1.4 per hour.  No bathroom trips.  IMPRESSIONS-RECOMMENDATIONS: 1. Successful continuous positive airway pressure titration to 11 cm     of water pressure, apnea/hypopnea index 0 per hour.  Pressure     adjustment was based on respiratory events and  snoring.  He wore a     Radiographer, therapeutic and Paykel nasal pillows mask with medium-sized pillows,     heated humidifier, C-Flex of 3. 2. Baseline diagnostic nocturnal polysomnogram on January 14, 2005,     had recorded an apnea/hypopnea index of 54 per hour and continuous     positive airway pressure titration to 13 for apnea/hypopnea index     of 0 per hour.  Note that on his original study his weight was     recorded as 330 pounds and on the current study it is recorded as     250 pounds. 3. Mild periodic limb movement with arousal.  A total of 72 limb jerks     were counted of which 9 were associated with arousal or awakening     for an index of 1.4 per hour.  This is of doubtful clinical     significance, especially as it was noted during CPAP titration     which often is associated with a few limb jerks.     Jason Oliphant D. Maple Hudson, MD, FCCP, FACP Diplomate, Biomedical engineer of Sleep Medicine Electronically Signed    CDY/MEDQ  D:  11/25/2010 09:35:29  T:  11/25/2010 11:10:57  Job:  [id number]

## 2010-11-30 ENCOUNTER — Ambulatory Visit (HOSPITAL_COMMUNITY)
Admission: RE | Admit: 2010-11-30 | Discharge: 2010-11-30 | Disposition: A | Discharge status: Home / Self Care | Payer: Medicaid Other | Source: Ambulatory Visit | Attending: Family Medicine | Admitting: Family Medicine

## 2010-11-30 ENCOUNTER — Other Ambulatory Visit (HOSPITAL_COMMUNITY): Payer: Self-pay | Admitting: Family Medicine

## 2010-11-30 DIAGNOSIS — K859 Acute pancreatitis without necrosis or infection, unspecified: Secondary | ICD-10-CM

## 2010-11-30 DIAGNOSIS — K863 Pseudocyst of pancreas: Secondary | ICD-10-CM

## 2010-11-30 DIAGNOSIS — R599 Enlarged lymph nodes, unspecified: Secondary | ICD-10-CM | POA: Insufficient documentation

## 2010-12-04 LAB — POCT URINALYSIS DIP (DEVICE)
Bilirubin Urine: NEGATIVE
Glucose, UA: 500 mg/dL — AB
Operator id: 30745
Specific Gravity, Urine: 1.02 (ref 1.005–1.030)

## 2011-08-22 ENCOUNTER — Encounter (HOSPITAL_COMMUNITY): Payer: Self-pay | Admitting: Emergency Medicine

## 2011-08-22 ENCOUNTER — Emergency Department (HOSPITAL_COMMUNITY)
Admission: EM | Admit: 2011-08-22 | Discharge: 2011-08-23 | Disposition: A | Discharge status: Home / Self Care | Payer: Self-pay | Attending: Emergency Medicine | Admitting: Emergency Medicine

## 2011-08-22 DIAGNOSIS — E119 Type 2 diabetes mellitus without complications: Secondary | ICD-10-CM | POA: Insufficient documentation

## 2011-08-22 DIAGNOSIS — N61 Mastitis without abscess: Secondary | ICD-10-CM | POA: Insufficient documentation

## 2011-08-22 DIAGNOSIS — L0291 Cutaneous abscess, unspecified: Secondary | ICD-10-CM

## 2011-08-22 DIAGNOSIS — E7800 Pure hypercholesterolemia, unspecified: Secondary | ICD-10-CM | POA: Insufficient documentation

## 2011-08-22 DIAGNOSIS — Z8614 Personal history of Methicillin resistant Staphylococcus aureus infection: Secondary | ICD-10-CM | POA: Insufficient documentation

## 2011-08-22 DIAGNOSIS — E669 Obesity, unspecified: Secondary | ICD-10-CM | POA: Insufficient documentation

## 2011-08-22 DIAGNOSIS — I10 Essential (primary) hypertension: Secondary | ICD-10-CM | POA: Insufficient documentation

## 2011-08-22 HISTORY — DX: Pure hypercholesterolemia, unspecified: E78.00

## 2011-08-22 HISTORY — DX: Obesity, unspecified: E66.9

## 2011-08-22 HISTORY — DX: Essential (primary) hypertension: I10

## 2011-08-22 HISTORY — DX: Carrier or suspected carrier of methicillin resistant Staphylococcus aureus: Z22.322

## 2011-08-22 MED ORDER — OXYCODONE-ACETAMINOPHEN 5-325 MG PO TABS
1.0000 | ORAL_TABLET | Freq: Once | ORAL | Status: AC
  Administered 2011-08-22: 1 via ORAL
  Filled 2011-08-22: qty 1

## 2011-08-22 NOTE — ED Notes (Signed)
PT. REPORTS ABSCESS AT LEFT BREAST FOR 2 DAYS  WITH NO DRAINAGE.

## 2011-08-22 NOTE — ED Notes (Signed)
Patient to ED with C/O having an abscess on his left chest. States that onset was Monday. Patient noted to have gynecomastia. Abscess is on his left lateral chest under his left breast.

## 2011-08-22 NOTE — ED Notes (Signed)
Alert no distress 

## 2011-08-23 NOTE — ED Provider Notes (Signed)
History     CSN: [id number]  Arrival date & time 08/22/11  2058   First MD Initiated Contact with Patient 08/22/11 2259      Chief Complaint  Patient presents with  . Abscess    (Consider location/radiation/quality/duration/timing/severity/associated sxs/prior treatment) Patient is a 36 y.o. male presenting with abscess. The history is provided by the patient.  Abscess  This is a new problem. Episode onset: 2 days. The onset was gradual. The problem occurs occasionally. Affected Location: under left breast. The problem is moderate. The abscess is characterized by redness (fluctuant). Pertinent negatives include no anorexia, no decrease in physical activity, not sleeping less, not drinking less, no fever, no fussiness, not sleeping more, no diarrhea, no vomiting, no congestion, no rhinorrhea, no sore throat, no decreased responsiveness and no cough. His past medical history does not include atopy in family. There were no sick contacts. He has received no recent medical care.    Past Medical History  Diagnosis Date  . Diabetes mellitus   . Hypertension   . Obesity   . Hypercholesteremia   . MRSA (methicillin resistant staph aureus) culture positive     History reviewed. No pertinent past surgical history.  No family history on file.  History  Substance Use Topics  . Smoking status: Never Smoker   . Smokeless tobacco: Not on file  . Alcohol Use: No      Review of Systems  Constitutional: Negative for fever and decreased responsiveness.  HENT: Negative for congestion, sore throat and rhinorrhea.   Respiratory: Negative for cough.   Gastrointestinal: Negative for vomiting, diarrhea and anorexia.  All other systems reviewed and are negative.    Allergies  Review of patient's allergies indicates no known allergies.  Home Medications   Current Outpatient Rx  Name Route Sig Dispense Refill  . GEMFIBROZIL 600 MG PO TABS Oral Take 600 mg by mouth 2 (two) times daily  before a meal.    . NAPROXEN SODIUM 220 MG PO TABS Oral Take 440 mg by mouth daily as needed. For headaches    . PRAVASTATIN SODIUM PO Oral Take 1 tablet by mouth daily.      BP 154/73  Pulse 95  Temp 99.2 F (37.3 C) (Oral)  Resp 14  SpO2 97%  Physical Exam  Nursing note and vitals reviewed. Constitutional: He is oriented to person, place, and time. He appears well-developed and well-nourished. He does not have a sickly appearance. He does not appear ill. No distress.  HENT:  Head: Normocephalic and atraumatic.  Eyes: Conjunctivae and EOM are normal.  Neck: Normal range of motion. Neck supple.  Cardiovascular: Normal rate and regular rhythm.   Pulmonary/Chest: Effort normal and breath sounds normal.  Musculoskeletal: He exhibits no edema.  Lymphadenopathy:       Head (right side): No submental, no preauricular and no posterior auricular adenopathy present.       Head (left side): No submental, no submandibular, no preauricular and no posterior auricular adenopathy present.    He has no axillary adenopathy.  Neurological: He is alert and oriented to person, place, and time.  Skin: Skin is warm and dry. No rash noted. He is not diaphoretic.       4cm sized abscess located under left breast. Extreme tenderness to palpation. Not currently draining. Abscess is fluctuant without warmth, surrounding erythema, or induration.    ED Course  Procedures (including critical care time)  Labs Reviewed - No data to display No results found.  1. Abscess    INCISION AND DRAINAGE Performed by: Jaci Carrel Consent: Verbal consent obtained. Risks and benefits: risks, benefits and alternatives were discussed Type: abscess  Body area: left breast   Anesthesia: local infiltration  Local anesthetic: lidocaine 2% w epinephrine  Anesthetic total: 2 ml  Complexity: complex Blunt dissection to break up loculations  Drainage: purulent  Drainage amount: moderate  Packing material: 1/4  in iodoform gauze  Patient tolerance: Patient tolerated the procedure well with no immediate complications.     MDM  Abscess  Patient with skin abscess amenable to incision and drainage.  Abscess was large enough to warrant packing with removal and wound recheck in 2 days. No signs of cellulitis is surrounding skin.  Will d/c to home.  No antibiotic therapy is indicated.         Jaci Carrel, [place] 08/23/11 [phone]

## 2011-08-23 NOTE — ED Provider Notes (Signed)
Medical screening examination/treatment/procedure(s) were performed by non-physician practitioner and as supervising physician I was immediately available for consultation/collaboration.  Janilah Hojnacki K Linker, MD 08/23/11 0230 

## 2011-08-23 NOTE — Discharge Instructions (Signed)
Followup with your doctor or an urgent care in order to remove your packing in 48-72 hours. You may return to the emergency department if you have  a fever that persists greater than 101 or your abscess appears to become infected (growing surrounding redness and warmth). Do not operate any heavy machinery while on pain medications. Do not consume alcohol on these medications either. ° °Abscess °An abscess (boil or furuncle) is an infected area that contains a collection of pus.  °SYMPTOMS °Signs and symptoms of an abscess include pain, tenderness, redness, or hardness. You may feel a moveable soft area under your skin. An abscess can occur anywhere in the body.  °TREATMENT  °A surgical cut (incision) may be made over your abscess to drain the pus. Gauze may be packed into the space or a drain may be looped through the abscess cavity (pocket). This provides a drain that will allow the cavity to heal from the inside outwards. The abscess may be painful for a few days, but should feel much better if it was drained.  °Your abscess, if seen early, may not have localized and may not have been drained. If not, another appointment may be required if it does not get better on its own or with medications. °HOME CARE INSTRUCTIONS  °· Only take over-the-counter or prescription medicines for pain, discomfort, or fever as directed by your caregiver.  °· Take your antibiotics as directed if they were prescribed. Finish them even if you start to feel better.  °· Keep the skin and clothes clean around your abscess.  °· If the abscess was drained, you will need to use gauze dressing to collect any draining pus. Dressings will typically need to be changed 3 or more times a day.  °· The infection may spread by skin contact with others. Avoid skin contact as much as possible.  °· Practice good hygiene. This includes regular hand washing, cover any draining skin lesions, and do not share personal care items.  °· If you participate in  sports, do not share athletic equipment, towels, whirlpools, or personal care items. Shower after every practice or tournament.  °· If a draining area cannot be adequately covered:  °· Do not participate in sports.  °· Children should not participate in day care until the wound has healed or drainage stops.  °· If your caregiver has given you a follow-up appointment, it is very important to keep that appointment. Not keeping the appointment could result in a much worse infection, chronic or permanent injury, pain, and disability. If there is any problem keeping the appointment, you must call back to this facility for assistance.  °SEEK MEDICAL CARE IF:  °· You develop increased pain, swelling, redness, drainage, or bleeding in the wound site.  °· You develop signs of generalized infection including muscle aches, chills, fever, or a general ill feeling.  °· You have an oral temperature above 102° F (38.9° C).  °MAKE SURE YOU:  °· Understand these instructions.  °· Will watch your condition.  °· Will get help right away if you are not doing well or get worse.  °Document Released: 11/28/2004 Document Revised: 10/31/2010 Document Reviewed: 09/22/2007 °ExitCare® Patient Information ©2012 [employer]. ° ° °

## 2011-08-25 ENCOUNTER — Encounter (HOSPITAL_COMMUNITY): Payer: Self-pay

## 2011-08-25 ENCOUNTER — Emergency Department (HOSPITAL_COMMUNITY)
Admission: EM | Admit: 2011-08-25 | Discharge: 2011-08-25 | Disposition: A | Discharge status: Home / Self Care | Payer: Self-pay | Attending: Emergency Medicine | Admitting: Emergency Medicine

## 2011-08-25 DIAGNOSIS — E7800 Pure hypercholesterolemia, unspecified: Secondary | ICD-10-CM | POA: Insufficient documentation

## 2011-08-25 DIAGNOSIS — E669 Obesity, unspecified: Secondary | ICD-10-CM | POA: Insufficient documentation

## 2011-08-25 DIAGNOSIS — Z4801 Encounter for change or removal of surgical wound dressing: Secondary | ICD-10-CM | POA: Insufficient documentation

## 2011-08-25 DIAGNOSIS — Z8614 Personal history of Methicillin resistant Staphylococcus aureus infection: Secondary | ICD-10-CM | POA: Insufficient documentation

## 2011-08-25 DIAGNOSIS — E119 Type 2 diabetes mellitus without complications: Secondary | ICD-10-CM | POA: Insufficient documentation

## 2011-08-25 DIAGNOSIS — Z5189 Encounter for other specified aftercare: Secondary | ICD-10-CM

## 2011-08-25 DIAGNOSIS — Z4800 Encounter for change or removal of nonsurgical wound dressing: Secondary | ICD-10-CM

## 2011-08-25 DIAGNOSIS — I10 Essential (primary) hypertension: Secondary | ICD-10-CM | POA: Insufficient documentation

## 2011-08-25 DIAGNOSIS — Z794 Long term (current) use of insulin: Secondary | ICD-10-CM | POA: Insufficient documentation

## 2011-08-25 NOTE — ED Provider Notes (Signed)
History     CSN: [id number]  Arrival date & time 08/25/11  0721   First MD Initiated Contact with Patient 08/25/11 [phone]      Chief Complaint  Patient presents with  . Wound Check    (Consider location/radiation/quality/duration/timing/severity/associated sxs/prior treatment) HPI Pt had abscess under left breast. I & D two days ago. Pt was told to return today for wound check.  Past Medical History  Diagnosis Date  . Diabetes mellitus   . Hypertension   . Obesity   . Hypercholesteremia   . MRSA (methicillin resistant staph aureus) culture positive     History reviewed. No pertinent past surgical history.  No family history on file.  History  Substance Use Topics  . Smoking status: Never Smoker   . Smokeless tobacco: Not on file  . Alcohol Use: No      Review of Systems  All other systems reviewed and are negative.    Allergies  Review of patient's allergies indicates no known allergies.  Home Medications   Current Outpatient Rx  Name Route Sig Dispense Refill  . GEMFIBROZIL 600 MG PO TABS Oral Take 600 mg by mouth 2 (two) times daily before a meal.    . INSULIN ISOPHANE & REGULAR (70-30) 100 UNIT/ML [place] SUSP Subcutaneous Inject 30 Units into the skin 2 (two) times daily with a meal.    . NAPROXEN SODIUM 220 MG PO TABS Oral Take 440 mg by mouth daily as needed. For headaches    . PRAVASTATIN SODIUM PO Oral Take 1 tablet by mouth daily.      BP 137/87  Pulse 90  Temp 98.3 F (36.8 C) (Oral)  Resp 18  SpO2 96%  Physical Exam  Skin:          Wound is without erythema redness.  Packing removed.  Wound is redressed patient to return when necessary    ED Course  Procedures (including critical care time)  Labs Reviewed - No data to display No results found.   1. Wound check, abscess   2. Abscess packing removal       MDM          Nelia Shi, MD 08/25/11 [phone]

## 2011-08-25 NOTE — ED Notes (Signed)
Pt had abscess under left breast.   I & D two days ago.  Pt was told to return today for a wound check.

## 2012-01-05 ENCOUNTER — Encounter (HOSPITAL_COMMUNITY): Payer: Self-pay | Admitting: Emergency Medicine

## 2012-01-05 ENCOUNTER — Emergency Department (HOSPITAL_COMMUNITY)
Admission: EM | Admit: 2012-01-05 | Discharge: 2012-01-05 | Disposition: A | Discharge status: Home / Self Care | Payer: Self-pay | Attending: Emergency Medicine | Admitting: Emergency Medicine

## 2012-01-05 ENCOUNTER — Emergency Department (HOSPITAL_COMMUNITY): Payer: Self-pay

## 2012-01-05 DIAGNOSIS — Y929 Unspecified place or not applicable: Secondary | ICD-10-CM | POA: Insufficient documentation

## 2012-01-05 DIAGNOSIS — W010XXA Fall on same level from slipping, tripping and stumbling without subsequent striking against object, initial encounter: Secondary | ICD-10-CM | POA: Insufficient documentation

## 2012-01-05 DIAGNOSIS — E669 Obesity, unspecified: Secondary | ICD-10-CM | POA: Insufficient documentation

## 2012-01-05 DIAGNOSIS — I10 Essential (primary) hypertension: Secondary | ICD-10-CM | POA: Insufficient documentation

## 2012-01-05 DIAGNOSIS — E119 Type 2 diabetes mellitus without complications: Secondary | ICD-10-CM | POA: Insufficient documentation

## 2012-01-05 DIAGNOSIS — Y939 Activity, unspecified: Secondary | ICD-10-CM | POA: Insufficient documentation

## 2012-01-05 DIAGNOSIS — Z8614 Personal history of Methicillin resistant Staphylococcus aureus infection: Secondary | ICD-10-CM | POA: Insufficient documentation

## 2012-01-05 DIAGNOSIS — S7000XA Contusion of unspecified hip, initial encounter: Secondary | ICD-10-CM | POA: Insufficient documentation

## 2012-01-05 DIAGNOSIS — Z8719 Personal history of other diseases of the digestive system: Secondary | ICD-10-CM | POA: Insufficient documentation

## 2012-01-05 DIAGNOSIS — Z79899 Other long term (current) drug therapy: Secondary | ICD-10-CM | POA: Insufficient documentation

## 2012-01-05 DIAGNOSIS — E7800 Pure hypercholesterolemia, unspecified: Secondary | ICD-10-CM | POA: Insufficient documentation

## 2012-01-05 DIAGNOSIS — Z794 Long term (current) use of insulin: Secondary | ICD-10-CM | POA: Insufficient documentation

## 2012-01-05 MED ORDER — CYCLOBENZAPRINE HCL 10 MG PO TABS: 10.0000 mg | ORAL_TABLET | Freq: Two times a day (BID) | ORAL | Status: DC | PRN

## 2012-01-05 MED ORDER — TRAMADOL HCL 50 MG PO TABS: 50.0000 mg | ORAL_TABLET | Freq: Four times a day (QID) | ORAL | Status: DC | PRN

## 2012-01-05 MED ORDER — TRAMADOL HCL 50 MG PO TABS
50.0000 mg | ORAL_TABLET | Freq: Once | ORAL | Status: AC
  Administered 2012-01-05: 50 mg via ORAL
  Filled 2012-01-05: qty 1

## 2012-01-05 NOTE — ED Notes (Signed)
Pt reports fell a couple of week ago. Pt c/o pain to right hip. Pt ambulatory around room. Pt has not taken anything for pain today.

## 2012-01-05 NOTE — ED Provider Notes (Signed)
Medical screening examination/treatment/procedure(s) were performed by non-physician practitioner and as supervising physician I was immediately available for consultation/collaboration.  Sheriece Jefcoat, MD 01/05/12 1642 

## 2012-01-05 NOTE — ED Provider Notes (Signed)
History     CSN: [id number]  Arrival date & time 01/05/12  0846   First MD Initiated Contact with Patient 01/05/12 [phone]      Chief Complaint  Patient presents with  . Hip Pain    (Consider location/radiation/quality/duration/timing/severity/associated sxs/prior treatment) HPI  Patient presents to the ED for evaluation of his right hip pain. He fell 2 weeks ago on it. He says that he fell on a slippery floor and his feet came out from under him. He denies being evaluated for this yet. He has been ambulatory with pain. The pain starts on the lateral side and goes down across the front of his thigh, then to the medial portion right above his knee. At age 36 he had a hip procedure done "and nails put in". He is unsure what the procedure was for but tells me "His hip wore down real quick". He denies systemic symptoms of fevers, chill, nausea,  Vomiting. He has not had anything today for pain. Hx of MRSA denies any rashes or abscesses at this time. nad vss  Past Medical History  Diagnosis Date  . Diabetes mellitus   . Hypertension   . Obesity   . Hypercholesteremia   . MRSA (methicillin resistant staph aureus) culture positive   . Pancreatitis     Past Surgical History  Procedure Date  . Testicle removal right testicle    No family history on file.  History  Substance Use Topics  . Smoking status: Never Smoker   . Smokeless tobacco: Not on file  . Alcohol Use: No      Review of Systems  Review of Systems  Gen: no weight loss, fevers, chills, night sweats  Eyes: no discharge or drainage, no occular pain or visual changes  Nose: no epistaxis or rhinorrhea  Mouth: no dental pain, no sore throat  Neck: no neck pain  Lungs:No wheezing, coughing or hemoptysis CV: no chest pain, palpitations, dependent edema or orthopnea  Abd: no abdominal pain, nausea, vomiting  GU: no dysuria or gross hematuria  MSK:  Right hip/thigh pain Neuro: no headache, no focal neurologic deficits   Skin: no abnormalities Psyche: negative.   Allergies  Review of patient's allergies indicates no known allergies.  Home Medications   Current Outpatient Rx  Name  Route  Sig  Dispense  Refill  . GEMFIBROZIL 600 MG PO TABS   Oral   Take 600 mg by mouth 2 (two) times daily before a meal.         . INSULIN ISOPHANE & REGULAR (70-30) 100 UNIT/ML [place] SUSP   Subcutaneous   Inject 40 Units into the skin 2 (two) times daily with a meal.          . NAPROXEN SODIUM 220 MG PO TABS   Oral   Take 440 mg by mouth daily as needed. For headaches         . CYCLOBENZAPRINE HCL 10 MG PO TABS   Oral   Take 1 tablet (10 mg total) by mouth 2 (two) times daily as needed for muscle spasms.   20 tablet   0   . TRAMADOL HCL 50 MG PO TABS   Oral   Take 1 tablet (50 mg total) by mouth every 6 (six) hours as needed for pain.   15 tablet   0     BP 147/96  Pulse 104  Temp 98.1 F (36.7 C) (Oral)  Resp 16  SpO2 97%  Physical Exam  Nursing  note and vitals reviewed. Constitutional: He appears well-developed and well-nourished. No distress.  HENT:  Head: Normocephalic and atraumatic.  Eyes: Pupils are equal, round, and reactive to light.  Neck: Normal range of motion. Neck supple.  Cardiovascular: Normal rate and regular rhythm.   Pulmonary/Chest: Effort normal.  Abdominal: Soft.  Musculoskeletal:       Right knee: He exhibits normal range of motion, no swelling, no effusion, no ecchymosis, no deformity, no laceration, no erythema, normal alignment, no LCL laxity and normal patellar mobility. tenderness (as shown on the illustration) found.       Legs:      No weakness,swelling, numbness, or tingling of the leg.  Neurological: He is alert.  Skin: Skin is warm and dry.    ED Course  Procedures (including critical care time)  Labs Reviewed - No data to display Dg Hip Complete Right  01/05/2012  *RADIOLOGY REPORT*  Clinical Data: Fall, right hip pain  RIGHT HIP - COMPLETE 2+ VIEW   Comparison: None.  Findings: Fixation screws traverse the femoral neck and head regions bilaterally.  Chronic deformity of the femoral heads appears symmetric.  No malalignment or acute fracture evident. Pelvis intact.  No diastasis.  Normal SI joints.  IMPRESSION: No acute osseous finding.  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   Original Report Authenticated By: Judie Petit. Shick, [name]      1. Contusion, hip       MDM  Pt having continued pain after a fall two weeks ago. He is able to ambulate. He was concerned about his hardware which is still intact. Ultram, flexeril given. Referral to Ortho given. Discussed results with the patient.  Pt has been advised of the symptoms that warrant their return to the ED. Patient has voiced understanding and has agreed to follow-up with the PCP or specialist.         Dorthula Matas, PA 01/05/12 1019

## 2012-03-17 ENCOUNTER — Encounter (HOSPITAL_COMMUNITY): Payer: Self-pay | Admitting: *Deleted

## 2012-03-17 ENCOUNTER — Emergency Department (HOSPITAL_COMMUNITY)
Admission: EM | Admit: 2012-03-17 | Discharge: 2012-03-18 | Disposition: A | Discharge status: Home / Self Care | Payer: Self-pay | Attending: Emergency Medicine | Admitting: Emergency Medicine

## 2012-03-17 DIAGNOSIS — E119 Type 2 diabetes mellitus without complications: Secondary | ICD-10-CM | POA: Insufficient documentation

## 2012-03-17 DIAGNOSIS — Z8614 Personal history of Methicillin resistant Staphylococcus aureus infection: Secondary | ICD-10-CM | POA: Insufficient documentation

## 2012-03-17 DIAGNOSIS — Z794 Long term (current) use of insulin: Secondary | ICD-10-CM | POA: Insufficient documentation

## 2012-03-17 DIAGNOSIS — M7660 Achilles tendinitis, unspecified leg: Secondary | ICD-10-CM | POA: Insufficient documentation

## 2012-03-17 DIAGNOSIS — Z79899 Other long term (current) drug therapy: Secondary | ICD-10-CM | POA: Insufficient documentation

## 2012-03-17 DIAGNOSIS — F17200 Nicotine dependence, unspecified, uncomplicated: Secondary | ICD-10-CM | POA: Insufficient documentation

## 2012-03-17 DIAGNOSIS — E669 Obesity, unspecified: Secondary | ICD-10-CM | POA: Insufficient documentation

## 2012-03-17 DIAGNOSIS — Z8719 Personal history of other diseases of the digestive system: Secondary | ICD-10-CM | POA: Insufficient documentation

## 2012-03-17 DIAGNOSIS — I10 Essential (primary) hypertension: Secondary | ICD-10-CM | POA: Insufficient documentation

## 2012-03-17 DIAGNOSIS — E7800 Pure hypercholesterolemia, unspecified: Secondary | ICD-10-CM | POA: Insufficient documentation

## 2012-03-17 NOTE — ED Notes (Signed)
presents with left foot pain & swelling x 3 weeks. denies any trauma, n/t. described as a sharp, shooting pain. tried ice and rest with no relief.

## 2012-03-17 NOTE — ED Notes (Signed)
Pt c/o left ankle pain x 3 weeks.  States hurts to walk on it and the pain is not going away

## 2012-03-18 ENCOUNTER — Emergency Department (HOSPITAL_COMMUNITY): Payer: Self-pay

## 2012-03-18 MED ORDER — HYDROCODONE-ACETAMINOPHEN 5-325 MG PO TABS: 1.0000 | ORAL_TABLET | Freq: Four times a day (QID) | ORAL | Status: DC | PRN

## 2012-03-18 MED ORDER — NAPROXEN 500 MG PO TABS: 500.0000 mg | ORAL_TABLET | Freq: Two times a day (BID) | ORAL | Status: DC

## 2012-03-18 NOTE — ED Provider Notes (Signed)
History     CSN: [id number]  Arrival date & time 03/17/12  2054   First MD Initiated Contact with Patient 03/18/12 0014      Chief Complaint  Patient presents with  . Ankle Pain    (Consider location/radiation/quality/duration/timing/severity/associated sxs/prior treatment) Patient is a 37 y.o. male presenting with ankle pain. The history is provided by the patient.  Ankle Pain  The incident occurred more than 1 week ago (3 weeks ago ). There was no injury mechanism. Pain location: left ankle. The quality of the pain is described as aching and throbbing. The pain is at a severity of 7/10. The pain is moderate. The pain has been intermittent since onset. Pertinent negatives include no numbness, no inability to bear weight, no loss of motion, no muscle weakness, no loss of sensation and no tingling. The symptoms are aggravated by bearing weight and activity. He has tried acetaminophen for the symptoms. The treatment provided mild relief.    Past Medical History  Diagnosis Date  . Diabetes mellitus   . Hypertension   . Obesity   . Hypercholesteremia   . MRSA (methicillin resistant staph aureus) culture positive   . Pancreatitis     Past Surgical History  Procedure Date  . Testicle removal right testicle    History reviewed. No pertinent family history.  History  Substance Use Topics  . Smoking status: Current Some Day Smoker  . Smokeless tobacco: Not on file  . Alcohol Use: No      Review of Systems  Constitutional: Negative for fever, diaphoresis and activity change.  HENT: Negative for congestion and neck pain.   Respiratory: Negative for cough.   Genitourinary: Negative for dysuria.  Musculoskeletal: Negative for myalgias.  Skin: Negative for color change and wound.  Neurological: Negative for tingling, numbness and headaches.  All other systems reviewed and are negative.    Allergies  Review of patient's allergies indicates no known allergies.  Home  Medications   Current Outpatient Rx  Name  Route  Sig  Dispense  Refill  . CYCLOBENZAPRINE HCL 10 MG PO TABS   Oral   Take 1 tablet (10 mg total) by mouth 2 (two) times daily as needed for muscle spasms.   20 tablet   0   . GEMFIBROZIL 600 MG PO TABS   Oral   Take 600 mg by mouth 2 (two) times daily before a meal.         . INSULIN ISOPHANE & REGULAR (70-30) 100 UNIT/ML [place] SUSP   Subcutaneous   Inject 40 Units into the skin 2 (two) times daily with a meal.          . NAPROXEN SODIUM 220 MG PO TABS   Oral   Take 440 mg by mouth daily as needed. For headaches         . TRAMADOL HCL 50 MG PO TABS   Oral   Take 1 tablet (50 mg total) by mouth every 6 (six) hours as needed for pain.   15 tablet   0     BP 152/90  Pulse 91  Temp 98.5 F (36.9 C) (Oral)  Resp 16  SpO2 98%  Physical Exam  Nursing note and vitals reviewed. Constitutional: He appears well-developed and well-nourished. No distress.  HENT:  Head: Normocephalic and atraumatic.  Eyes: Conjunctivae normal and EOM are normal.  Neck: Normal range of motion. Neck supple.  Cardiovascular:       Intact distal pulses, capillary refill <  3 seconds  Musculoskeletal:       Ttp along left achilles tendon. Intact Thompson test. Pain with plantar flexion. No pain w inversion or eversion. All other extremities with normal ROM  Neurological:       No sensory deficit  Skin: He is not diaphoretic.       Skin intact, no tenting    ED Course  Procedures (including critical care time)  Labs Reviewed - No data to display Dg Ankle Complete Left  03/18/2012  *RADIOLOGY REPORT*  Clinical Data: Posterior pain  LEFT ANKLE COMPLETE - 3+ VIEW  Comparison: None.  Findings: There is calcification in the Achilles tendon near its insertion on the calcaneus.  There is overlying soft tissue swelling.  There is also a calcaneal spur at the plantar aponeurosis.  Ankle mortise intact.  Negative for fracture, dislocation, or other acute  bony abnormality.  IMPRESSION:  1.  Calcific Achilles tendonitis   Original Report Authenticated By: D. Andria Rhein, MD      No diagnosis found.    MDM  Achilles tendonitis  Presentation c/w Achilles tendonitis. Imaging reviewed and no sign of fracture or dislocation. Conservative home therapies discussed in depth with pt and he was given UpToDate information at dc. Will refer to ortho if symptoms do not improve in 10 days.              Jaci Carrel, [place] 03/18/12 [phone]

## 2012-03-18 NOTE — ED Notes (Signed)
PA at bedside.

## 2012-03-18 NOTE — ED Notes (Signed)
Patient transported to X-ray 

## 2012-03-18 NOTE — ED Provider Notes (Signed)
Medical screening examination/treatment/procedure(s) were performed by non-physician practitioner and as supervising physician I was immediately available for consultation/collaboration.  Letrice Pollok K Mikale Silversmith, MD 03/18/12 0522 

## 2012-08-08 IMAGING — CT CT PELVIS W/ CM
2 of 3 series · 17 of 46 positions shown, 19 images · IV contrast (omnipaque)
Comparison: 07/16/2010

CLINICAL DATA: Pelvic pain, testicular pain.

CT PELVIS WITH CONTRAST
TECHNIQUE: Multidetector CT imaging of the pelvis was performed
using the standard protocol following the bolus administration of
intravenous contrast.
Contrast:  100 ml Omnipaque 300 IV.

[Series 2: rtn a/p with · axial · 0.84mm/px · z∈[-752,-387]mm · 14 of 85 slices shown, 16 images]
[im 6/85  soft-tissue]
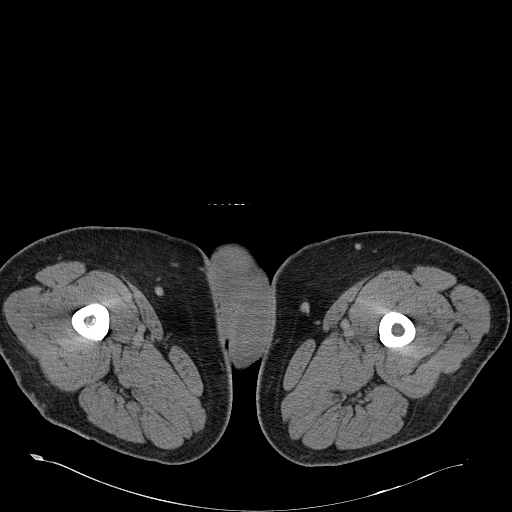
[im 6/85  bone]
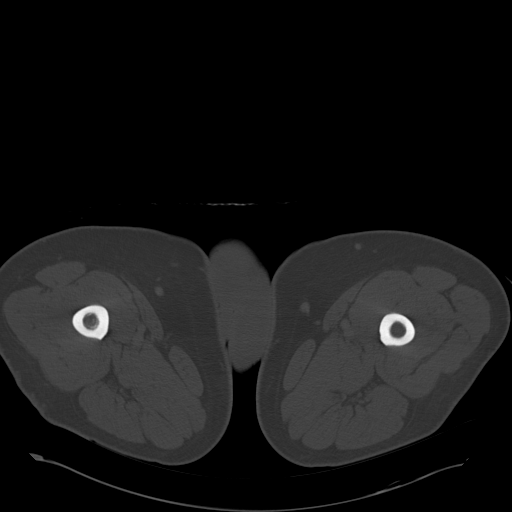
[im 11/85  soft-tissue]
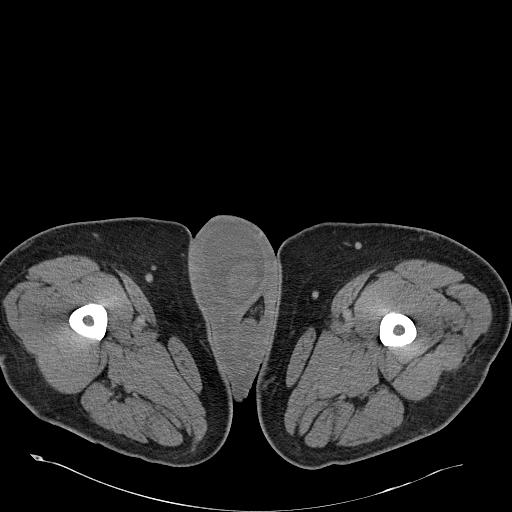
[im 17/85  soft-tissue]
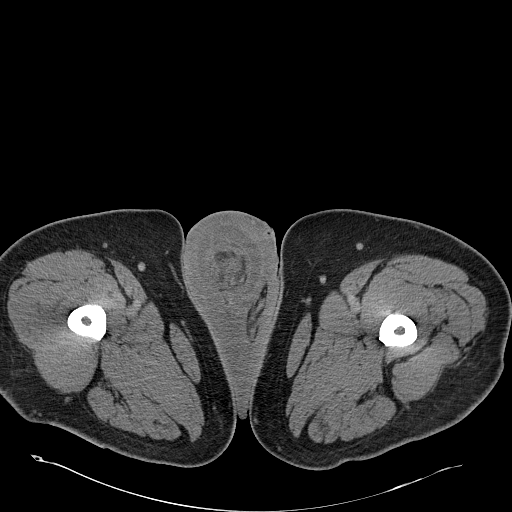
[im 22/85  soft-tissue]
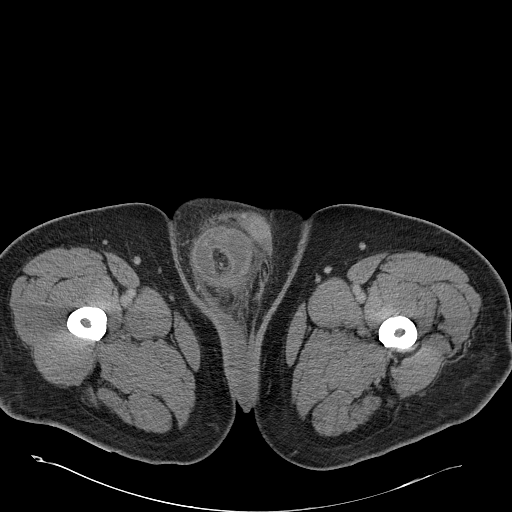
[im 28/85  soft-tissue]
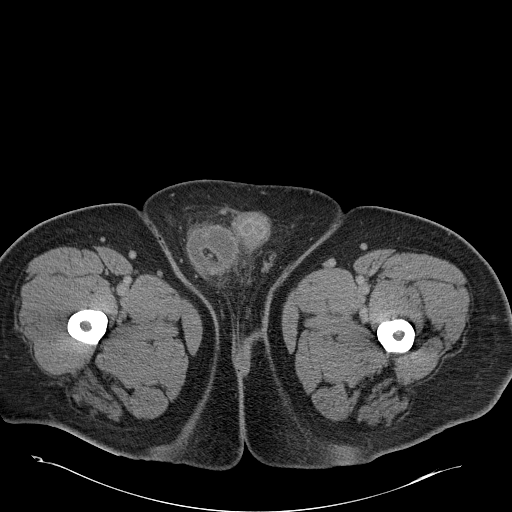
[im 33/85  soft-tissue]
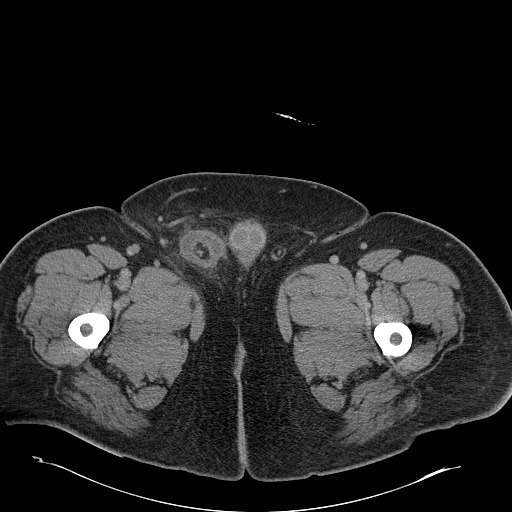
[im 38/85  soft-tissue]
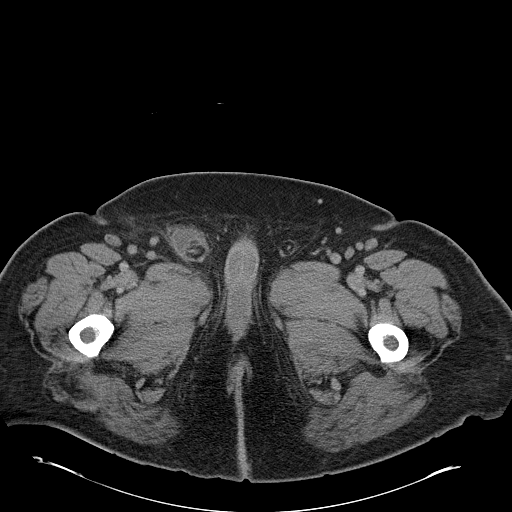
[im 47/85  soft-tissue]
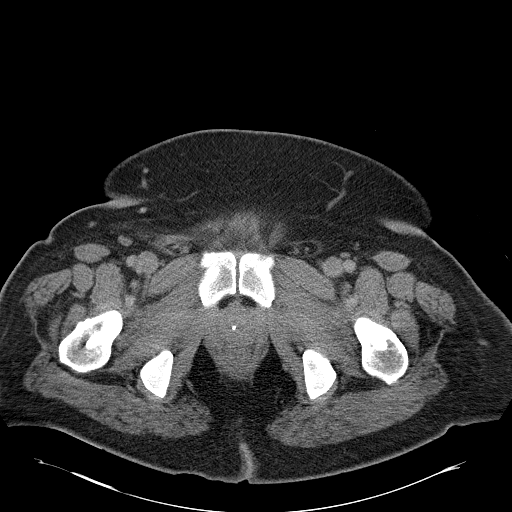
[im 52/85  soft-tissue]
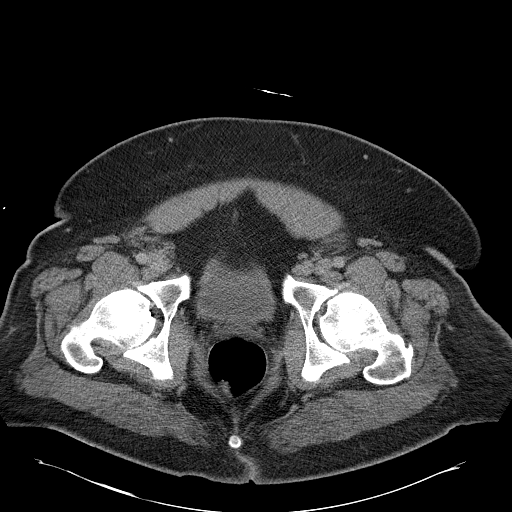
[im 52/85  bone]
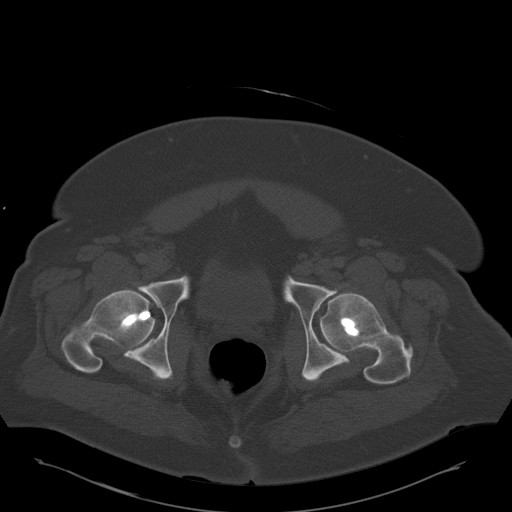
[im 57/85  soft-tissue]
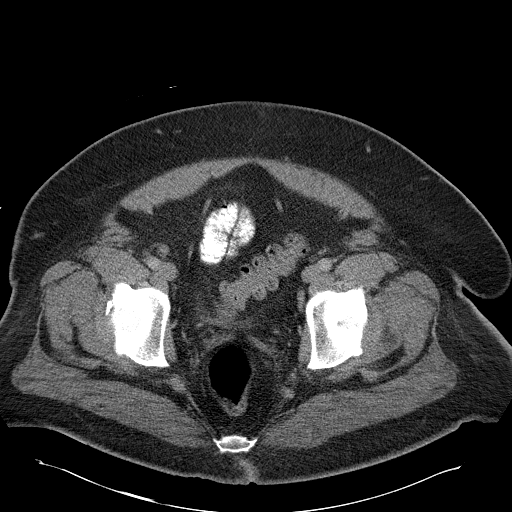
[im 63/85  soft-tissue]
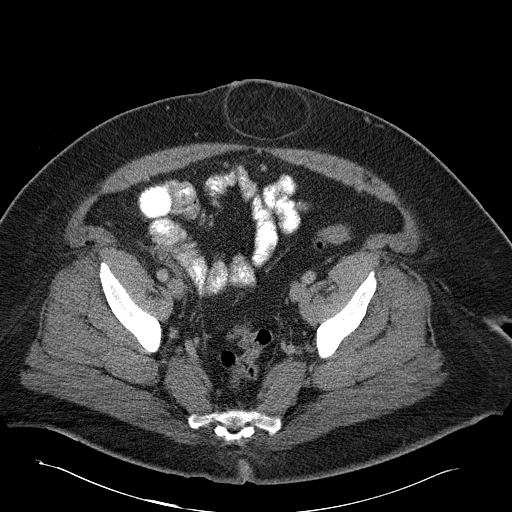
[im 68/85  soft-tissue]
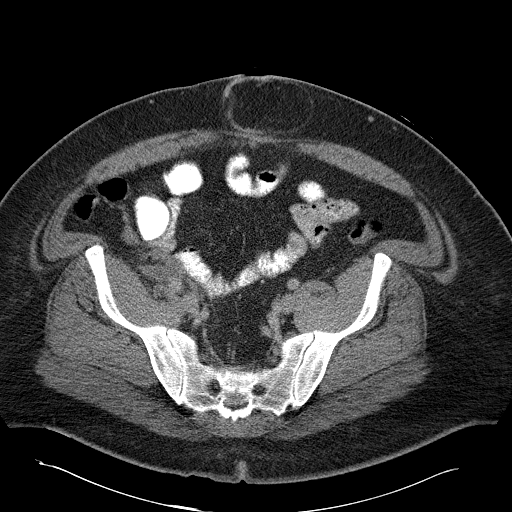
[im 74/85  soft-tissue]
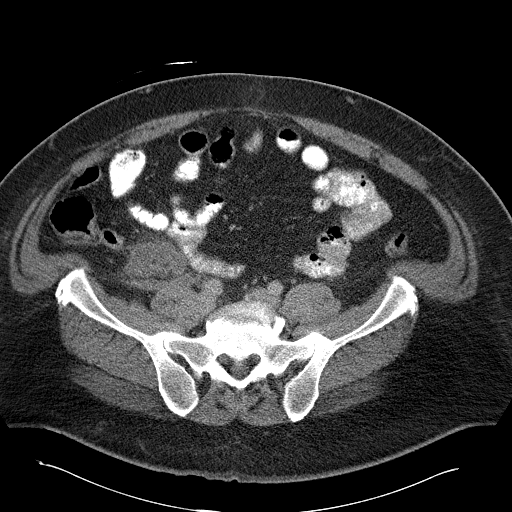
[im 79/85  soft-tissue]
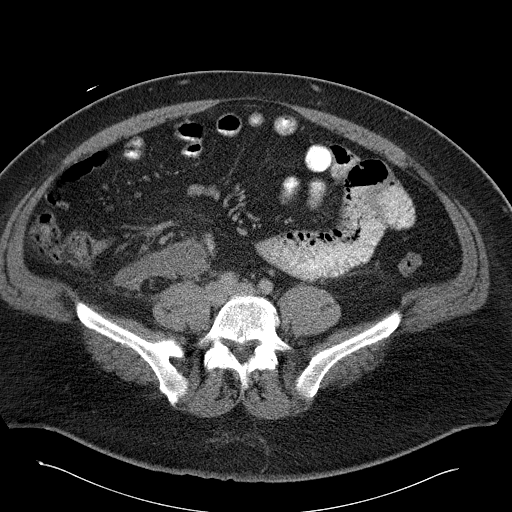

[Series 602: <mpr thick range> · coronal · 0.84mm/px · 3 of 112 slices shown]
[im 38/112  soft-tissue]
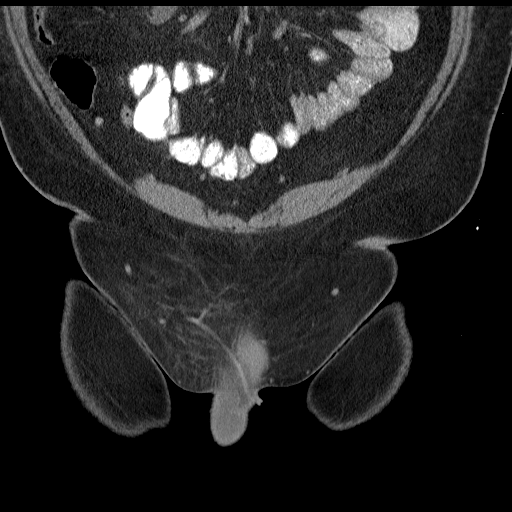
[im 50/112  soft-tissue]
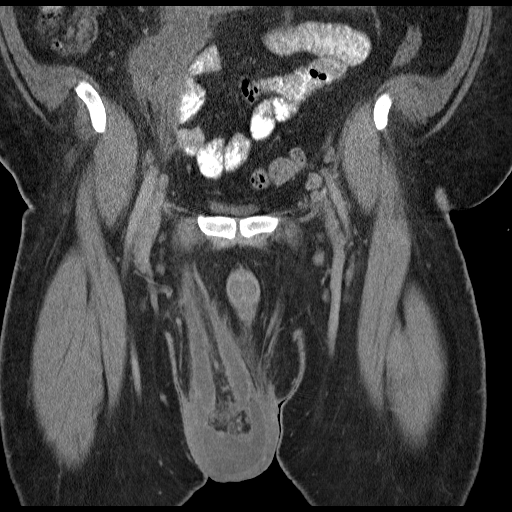
[im 62/112  soft-tissue]
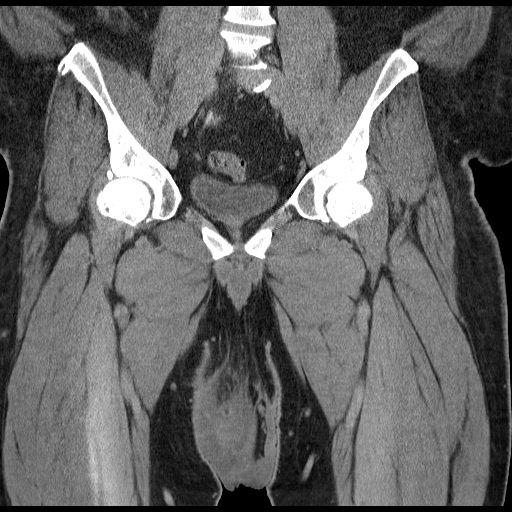

[17 of 46 positions shown; findings below may reference images not displayed]

FINDINGS: Partially imaged on the first image in the lower abdomen
is possible fluid collection, which could represent a developing
pseudocyst from prior pancreatitis.  This is incompletely imaged on
the CT pelvis only.

There is a large umbilical hernia containing fat, stable.  Right
inguinal hernia is present.  This contains fluid.  Moderate to
large right hydrocele noted.

Visualized large and small bowel are grossly unremarkable.  Bladder
decompressed.

Screws are seen within the femoral head/neck regions bilaterally.
No acute bony abnormality.
IMPRESSION: Stable umbilical hernia and right inguinal hernia.  The right
inguinal hernia contains fluid.  Moderate to large right hydrocele.

Partially imaged in the lower abdomen is fluid anterior to a small
bowel loop.  This may represent developing pseudocyst, but is
incompletely visualized and evaluated on the CT pelvis only.

## 2012-08-08 IMAGING — CR DG CHEST 1V PORT
1 series · 1 of 1 positions shown · non-contrast
Comparison: none

CLINICAL DATA: Hypotensive.  Shortness of breath.

PORTABLE CHEST - 1 VIEW

[PA]
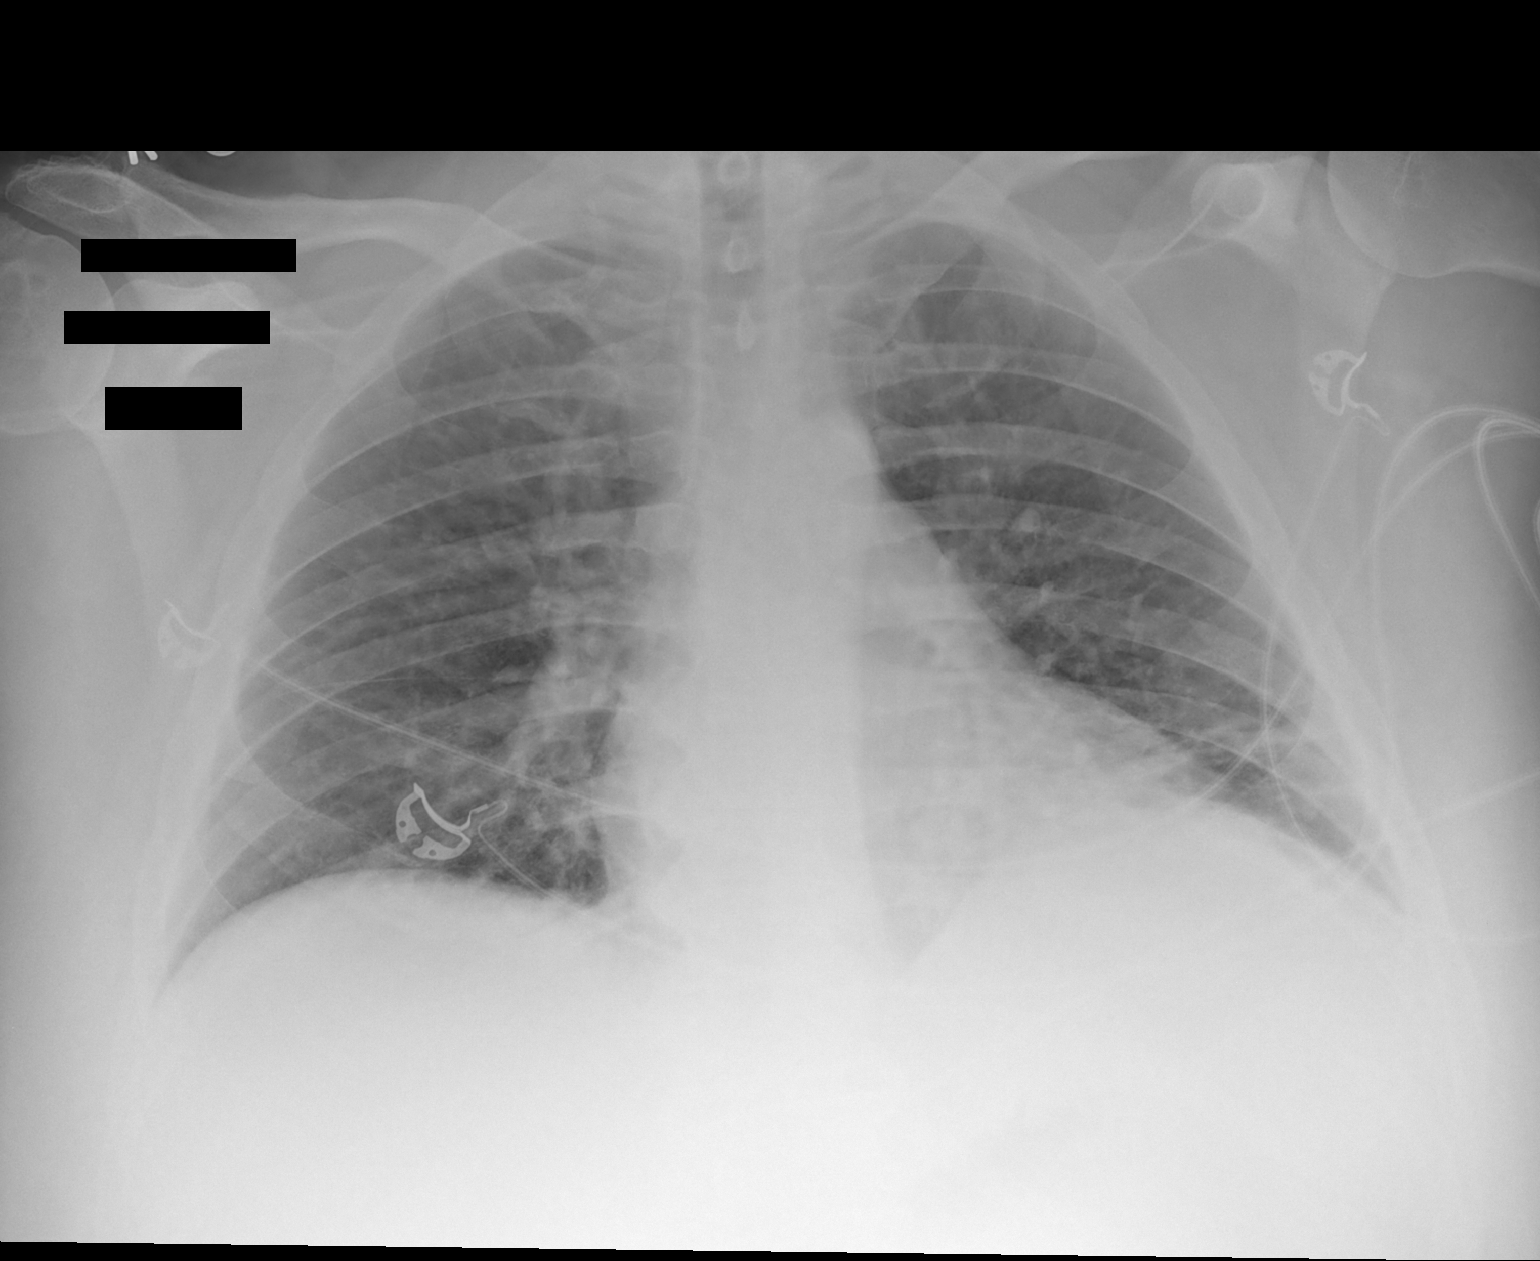

[1 of 1 positions shown; findings below may reference images not displayed]

FINDINGS: Heart size appears normal.

No pleural effusion or pulmonary edema.

The lung volumes are low.

There is plate-like atelectasis in the left base.
IMPRESSION: 1.  Low lung volumes.
2.  Left base atelectasis.

## 2012-09-25 ENCOUNTER — Emergency Department (HOSPITAL_COMMUNITY)
Admission: EM | Admit: 2012-09-25 | Discharge: 2012-09-25 | Disposition: A | Discharge status: Home / Self Care | Payer: Self-pay | Attending: Emergency Medicine | Admitting: Emergency Medicine

## 2012-09-25 ENCOUNTER — Encounter (HOSPITAL_COMMUNITY): Payer: Self-pay | Admitting: Adult Health

## 2012-09-25 DIAGNOSIS — E119 Type 2 diabetes mellitus without complications: Secondary | ICD-10-CM | POA: Insufficient documentation

## 2012-09-25 DIAGNOSIS — I10 Essential (primary) hypertension: Secondary | ICD-10-CM | POA: Insufficient documentation

## 2012-09-25 DIAGNOSIS — R509 Fever, unspecified: Secondary | ICD-10-CM | POA: Insufficient documentation

## 2012-09-25 DIAGNOSIS — J3489 Other specified disorders of nose and nasal sinuses: Secondary | ICD-10-CM | POA: Insufficient documentation

## 2012-09-25 DIAGNOSIS — H9209 Otalgia, unspecified ear: Secondary | ICD-10-CM | POA: Insufficient documentation

## 2012-09-25 DIAGNOSIS — Z8614 Personal history of Methicillin resistant Staphylococcus aureus infection: Secondary | ICD-10-CM | POA: Insufficient documentation

## 2012-09-25 DIAGNOSIS — Z862 Personal history of diseases of the blood and blood-forming organs and certain disorders involving the immune mechanism: Secondary | ICD-10-CM | POA: Insufficient documentation

## 2012-09-25 DIAGNOSIS — Z8639 Personal history of other endocrine, nutritional and metabolic disease: Secondary | ICD-10-CM | POA: Insufficient documentation

## 2012-09-25 DIAGNOSIS — Z8719 Personal history of other diseases of the digestive system: Secondary | ICD-10-CM | POA: Insufficient documentation

## 2012-09-25 DIAGNOSIS — J029 Acute pharyngitis, unspecified: Secondary | ICD-10-CM | POA: Insufficient documentation

## 2012-09-25 DIAGNOSIS — E669 Obesity, unspecified: Secondary | ICD-10-CM | POA: Insufficient documentation

## 2012-09-25 DIAGNOSIS — Z794 Long term (current) use of insulin: Secondary | ICD-10-CM | POA: Insufficient documentation

## 2012-09-25 DIAGNOSIS — F17200 Nicotine dependence, unspecified, uncomplicated: Secondary | ICD-10-CM | POA: Insufficient documentation

## 2012-09-25 MED ORDER — PENICILLIN V POTASSIUM 500 MG PO TABS: 500.0000 mg | ORAL_TABLET | Freq: Four times a day (QID) | ORAL | Status: DC

## 2012-09-25 NOTE — ED Provider Notes (Signed)
CSN: [id number]     Arrival date & time 09/25/12  1932 History  This chart was scribed for non-physician practitioner working with Suzi Roots, MD, by Ardelia Mems ED Scribe. This patient was seen in room TR04C/TR04C and the patient's care was started at 9:42 PM.   First MD Initiated Contact with Patient 09/25/12 2035     Chief Complaint  Patient presents with  . Sore Throat    The history is provided by the patient. No language interpreter was used.   HPI Comments: [name] is a 37 y.o. male with a history of DM, HTN, hypercholesteremia and pancreatitis who presents to the Emergency Department complaining of gradually worsening, "sharp" sore throat pain over the past 4 days. He reports associated bilateral ear pain, nasal congestion and subjective fever.  He states that he has taken Tylenol w/o relief. He states that he has not taken a nasal decongestant. He takes insulin daily along with Nova 70/30 shots, and he measures his blood sugar every day at 170-200. He denies cough, nausea, vomiting, fever, abdominal pain, SOB or any other symptoms.     Past Medical History  Diagnosis Date  . Diabetes mellitus   . Hypertension   . Obesity   . Hypercholesteremia   . MRSA (methicillin resistant staph aureus) culture positive   . Pancreatitis    Past Surgical History  Procedure Laterality Date  . Testicle removal  right testicle   History reviewed. No pertinent family history. History  Substance Use Topics  . Smoking status: Current Some Day Smoker  . Smokeless tobacco: Not on file  . Alcohol Use: No    Review of Systems  Constitutional: Positive for fever. Negative for chills.  HENT: Positive for ear pain, congestion and sore throat.   Respiratory: Negative for cough and shortness of breath.   Gastrointestinal: Negative for nausea, vomiting and abdominal pain.  All other systems reviewed and are negative.    Allergies  Review of patient's allergies indicates no known  allergies.  Home Medications   Current Outpatient Rx  Name  Route  Sig  Dispense  Refill  . insulin NPH-insulin regular (NOVOLIN 70/30) (70-30) 100 UNIT/ML injection   Subcutaneous   Inject 80 Units into the skin 2 (two) times daily with a meal.           Triage Vitals: BP 148/84  Pulse 103  Temp(Src) 99.2 F (37.3 C) (Oral)  Resp 20  SpO2 96%  Physical Exam  Nursing note and vitals reviewed. Constitutional: He is oriented to person, place, and time. He appears well-developed and well-nourished. No distress.  HENT:  Head: Normocephalic and atraumatic.  Right Ear: External ear normal.  Left Ear: External ear normal.  Nose: Nose normal.  Bilateral tonsillar enlargement. Uvula midline.  Eyes: Conjunctivae are normal.  Neck: Normal range of motion. No tracheal deviation present.  Cardiovascular: Normal rate, regular rhythm and normal heart sounds.   Pulmonary/Chest: Effort normal and breath sounds normal. No stridor.  Abdominal: Soft. He exhibits no distension. There is no tenderness.  Musculoskeletal: Normal range of motion.  Lymphadenopathy:    He has cervical adenopathy (bilateral).  Neurological: He is alert and oriented to person, place, and time.  Skin: Skin is warm and dry. He is not diaphoretic.  Psychiatric: He has a normal mood and affect. His behavior is normal.    ED Course   Procedures (including critical care time)  DIAGNOSTIC STUDIES: Oxygen Saturation is 96% on RA, normal by my  interpretation.    COORDINATION OF CARE: 9:46 PM- Pt advised of positive Strep test and advised of plan to receive penicillin upon discharge and pt agrees.   Labs Reviewed  RAPID STREP SCREEN  CULTURE, GROUP A STREP   No results found.  1. Pharyngitis     MDM  Patient presents with pharyngitis. Swollen tonsils bilaterally, tender cervical adenopathy, absence of cough. Will treat with PCN. No steroids due to diabetes. He reports that he cannot take NSAIDS. Return  instructions given. Follow up with PCP. Vital signs stable for discharge. Patient / Family / Caregiver informed of clinical course, understand medical decision-making process, and agree with plan.      I personally performed the services described in this documentation, which was scribed in my presence. The recorded information has been reviewed and is accurate.     Mora Bellman, PA-C 09/26/12 [phone]

## 2012-09-25 NOTE — ED Notes (Signed)
Presents with 3-4 days of sore throat associated with ear tenderness and pressure. Throat red, no exudate noted.

## 2012-09-27 LAB — CULTURE, GROUP A STREP

## 2012-09-28 NOTE — ED Notes (Signed)
+   Culture,group A strep- treated with PCN-chart appended per protocol MD.

## 2012-09-29 NOTE — ED Provider Notes (Signed)
Medical screening examination/treatment/procedure(s) were performed by non-physician practitioner and as supervising physician I was immediately available for consultation/collaboration.   Suzi Roots, MD 09/29/12 [phone]

## 2013-02-01 ENCOUNTER — Emergency Department (HOSPITAL_COMMUNITY)
Admission: EM | Admit: 2013-02-01 | Discharge: 2013-02-01 | Disposition: A | Discharge status: Home / Self Care | Payer: Medicaid Other | Attending: Emergency Medicine | Admitting: Emergency Medicine

## 2013-02-01 ENCOUNTER — Encounter (HOSPITAL_COMMUNITY): Payer: Self-pay | Admitting: Emergency Medicine

## 2013-02-01 DIAGNOSIS — E7800 Pure hypercholesterolemia, unspecified: Secondary | ICD-10-CM | POA: Insufficient documentation

## 2013-02-01 DIAGNOSIS — Z8614 Personal history of Methicillin resistant Staphylococcus aureus infection: Secondary | ICD-10-CM | POA: Insufficient documentation

## 2013-02-01 DIAGNOSIS — L02214 Cutaneous abscess of groin: Secondary | ICD-10-CM

## 2013-02-01 DIAGNOSIS — Z794 Long term (current) use of insulin: Secondary | ICD-10-CM | POA: Insufficient documentation

## 2013-02-01 DIAGNOSIS — F17200 Nicotine dependence, unspecified, uncomplicated: Secondary | ICD-10-CM | POA: Insufficient documentation

## 2013-02-01 DIAGNOSIS — E669 Obesity, unspecified: Secondary | ICD-10-CM | POA: Insufficient documentation

## 2013-02-01 DIAGNOSIS — Z79899 Other long term (current) drug therapy: Secondary | ICD-10-CM | POA: Insufficient documentation

## 2013-02-01 DIAGNOSIS — K429 Umbilical hernia without obstruction or gangrene: Secondary | ICD-10-CM | POA: Insufficient documentation

## 2013-02-01 DIAGNOSIS — Z8719 Personal history of other diseases of the digestive system: Secondary | ICD-10-CM | POA: Insufficient documentation

## 2013-02-01 DIAGNOSIS — E1169 Type 2 diabetes mellitus with other specified complication: Secondary | ICD-10-CM | POA: Insufficient documentation

## 2013-02-01 DIAGNOSIS — L02219 Cutaneous abscess of trunk, unspecified: Secondary | ICD-10-CM | POA: Insufficient documentation

## 2013-02-01 DIAGNOSIS — I10 Essential (primary) hypertension: Secondary | ICD-10-CM | POA: Insufficient documentation

## 2013-02-01 LAB — GLUCOSE, CAPILLARY: Glucose-Capillary: 230 mg/dL — ABNORMAL HIGH (ref 70–99)

## 2013-02-01 MED ORDER — CEPHALEXIN 500 MG PO CAPS: 500.0000 mg | ORAL_CAPSULE | Freq: Two times a day (BID) | ORAL | Status: DC

## 2013-02-01 MED ORDER — SULFAMETHOXAZOLE-TRIMETHOPRIM 800-160 MG PO TABS: 1.0000 | ORAL_TABLET | Freq: Two times a day (BID) | ORAL | Status: DC

## 2013-02-01 NOTE — ED Provider Notes (Signed)
Medical screening examination/treatment/procedure(s) were performed by non-physician practitioner and as supervising physician I was immediately available for consultation/collaboration.  EKG Interpretation   None         Charles B. Bernette Mayers, MD 02/01/13 [phone]

## 2013-02-01 NOTE — ED Notes (Signed)
Pt reports worsening abscess to L groin area x 1 week. Has tried hot compress with no relief. Area is red and indurated.

## 2013-02-01 NOTE — ED Notes (Signed)
PA at bedside. I&D performed.

## 2013-02-01 NOTE — ED Provider Notes (Signed)
CSN: [id number]     Arrival date & time 02/01/13  0865 History   First MD Initiated Contact with Patient 02/01/13 [phone]     Chief Complaint  Patient presents with  . Abscess    HPI  [name] is a 37 y.o. male with a PMH of DM, HTN, Hypercholesteremia, MRSA, and pancreatitis who presents to the ED for evaluation of an abscess.  History was provided by the patient. Patient states that he has had a developing abscess to the left groin for the past week. He denies any history of trauma or injury or wound prior to the onset of the developing abscess.  He denies any drainage. He has been applying warm compresses with no relief. He states he has a history of abscesses in the past. He has not been in antibiotics recently. Patient states his blood sugars have been well-controlled. No fever, abdominal pain, nausea, vomiting. She is otherwise been well.  No change in appetite/activity.        Past Medical History  Diagnosis Date  . Diabetes mellitus   . Hypertension   . Obesity   . Hypercholesteremia   . MRSA (methicillin resistant staph aureus) culture positive   . Pancreatitis    Past Surgical History  Procedure Laterality Date  . Testicle removal  right testicle   No family history on file. History  Substance Use Topics  . Smoking status: Current Some Day Smoker  . Smokeless tobacco: Not on file  . Alcohol Use: No    Review of Systems  Constitutional: Negative for fever, chills, diaphoresis, activity change, appetite change and fatigue.  HENT: Negative for congestion and sore throat.   Respiratory: Negative for cough and shortness of breath.   Cardiovascular: Negative for chest pain and leg swelling.  Gastrointestinal: Negative for nausea, vomiting and abdominal pain.  Genitourinary: Negative for dysuria, hematuria, flank pain, penile swelling, scrotal swelling, difficulty urinating, genital sores, penile pain and testicular pain.  Musculoskeletal: Negative for back pain, gait  problem, joint swelling and myalgias.  Skin: Positive for color change. Negative for wound.  Neurological: Negative for weakness and headaches.    Allergies  Review of patient's allergies indicates no known allergies.  Home Medications   Current Outpatient Rx  Name  Route  Sig  Dispense  Refill  . Calcium Carbonate-Vitamin D (CALCIUM + D PO)   Oral   Take 1 tablet by mouth daily.         Marland Kitchen CINNAMON PO   Oral   Take 1 tablet by mouth daily.         . Fish Oil-Cholecalciferol (FISH OIL + D3 PO)   Oral   Take 2 capsules by mouth daily.         Marland Kitchen ibuprofen (ADVIL,MOTRIN) 200 MG tablet   Oral   Take 400 mg by mouth every 6 (six) hours as needed for mild pain or moderate pain.         Marland Kitchen insulin NPH-insulin regular (NOVOLIN 70/30) (70-30) 100 UNIT/ML injection   Subcutaneous   Inject 80 Units into the skin 2 (two) times daily with a meal.          . multivitamin (ONE-A-DAY MEN'S) TABS tablet   Oral   Take 1 tablet by mouth daily.         . vitamin C (ASCORBIC ACID) 500 MG tablet   Oral   Take 500 mg by mouth daily.          BP  157/109  Pulse 100  Temp(Src) 98.4 F (36.9 C) (Oral)  Resp 18  SpO2 93%  Filed Vitals:   02/01/13 0642 02/01/13 0800  BP: 157/109 156/93  Pulse: 100 90  Temp: 98.4 F (36.9 C) 98.3 F (36.8 C)  TempSrc: Oral Oral  Resp: 18 18  SpO2: 93% 98%    Physical Exam  Nursing note and vitals reviewed. Constitutional: He is oriented to person, place, and time. He appears well-developed and well-nourished. No distress.  HENT:  Head: Normocephalic and atraumatic.  Right Ear: External ear normal.  Left Ear: External ear normal.  Nose: Nose normal.  Eyes: Conjunctivae are normal. Right eye exhibits no discharge. Left eye exhibits no discharge.  Neck: Normal range of motion. Neck supple.  Cardiovascular: Normal rate, regular rhythm and normal heart sounds.  Exam reveals no gallop and no friction rub.   No murmur heard. Dorsalis  pedis pulses present and equal bilaterally  Pulmonary/Chest: Effort normal and breath sounds normal. No respiratory distress. He has no wheezes. He has no rales. He exhibits no tenderness.  Abdominal: Soft. He exhibits no distension and no mass. There is no tenderness. There is no rebound and no guarding.  Reducible periumbilical hernia  Musculoskeletal: Normal range of motion. He exhibits no edema and no tenderness.  Patient able to ambulate without difficulty or ataxia.  No pedal edema or calf tenderness bilaterally  Neurological: He is alert and oriented to person, place, and time.  Skin: Skin is warm and dry. No rash noted. He is not diaphoretic. No erythema. No pallor.     3 cm circular abscess to the left groin with no open wound.  Erythema overlying the abscess with mild 1 cm circular border or erythema.  Abscess fluctuant with a 1 cm circular surrounding area of induration.  Area is tender to palpation.  No limitations with left hip ROM.      ED Course  INCISION AND DRAINAGE Date/Time: 02/01/2013 8:00 AM Performed by: Coral Ceo K Authorized by: Jillyn Ledger Consent: Verbal consent obtained. Consent given by: patient Patient identity confirmed: verbally with patient Type: abscess Body area: lower extremity Location details: left leg Patient sedated: no Scalpel size: 11 Incision type: single straight Complexity: simple Drainage: purulent Drainage amount: moderate Wound treatment: drain placed Packing material: wick placed and 1/4 in gauze Patient tolerance: Patient tolerated the procedure well with no immediate complications. Comments: Patient declined lidocaine    (including critical care time) Labs Review Labs Reviewed - No data to display Imaging Review No results found.  EKG Interpretation   None      Results for orders placed during the hospital encounter of 02/01/13  GLUCOSE, CAPILLARY      Result Value Range   Glucose-Capillary 230 (*) 70 - 99  mg/dL    MDM   [name] is a 37 y.o. male with a PMH of DM, HTN, Hypercholesteremia, MRSA, and pancreatitis who presents to the ED for evaluation of an abscess.  Glucose check ordered.     Rechecks  8:00 AM = patient has not yet taken his insulin or eaten breakfast yet.  Has enough insulin to take when he gets home.  Will take it.  Patient dressed and in no acute distress. 8:07 AM = spoke with patient about his elevated blood pressure. He states that he ran out of his blood pressure medications. He cannot remember what he was taking in the past. I look back in his records and I am unable  to find out what he was on previously for his blood pressure.  He states that he did not have a primary care provider anymore.  Patient given resources to followup with a new primary care provider to restart his blood pressure medications.    Patient was evaluated in the emergency room for an abscess to the left groin. Abscess was I&D without any difficulty/complications. Patient was prescribed antibiotics and instructed to have his wick removed in 2 days by primary care provider. He was instructed to return to the emergency room for wound recheck if he is unable to find followup as an outpatient. He was given return precautions. Patient afebrile and nontoxic in appearance. Patient was in agreement with discharge and plan.   Discharge Medication List as of 02/01/2013  8:04 AM    START taking these medications   Details  cephALEXin (KEFLEX) 500 MG capsule Take 1 capsule (500 mg total) by mouth 2 (two) times daily., Starting 02/01/2013, Until Discontinued, Print    sulfamethoxazole-trimethoprim (SEPTRA DS) 800-160 MG per tablet Take 1 tablet by mouth every 12 (twelve) hours., Starting 02/01/2013, Until Discontinued, Print         Final impressions: 1. Abscess of left groin       Thomasenia Sales         Jillyn Ledger, [place] 02/01/13 [phone]

## 2013-11-18 ENCOUNTER — Emergency Department (HOSPITAL_COMMUNITY)
Admission: EM | Admit: 2013-11-18 | Discharge: 2013-11-19 | Disposition: A | Discharge status: Home / Self Care | Payer: Medicaid Other | Attending: Emergency Medicine | Admitting: Emergency Medicine

## 2013-11-18 ENCOUNTER — Encounter (HOSPITAL_COMMUNITY): Payer: Self-pay | Admitting: Emergency Medicine

## 2013-11-18 DIAGNOSIS — I10 Essential (primary) hypertension: Secondary | ICD-10-CM | POA: Insufficient documentation

## 2013-11-18 DIAGNOSIS — E669 Obesity, unspecified: Secondary | ICD-10-CM | POA: Insufficient documentation

## 2013-11-18 DIAGNOSIS — Z79899 Other long term (current) drug therapy: Secondary | ICD-10-CM | POA: Insufficient documentation

## 2013-11-18 DIAGNOSIS — L0291 Cutaneous abscess, unspecified: Secondary | ICD-10-CM

## 2013-11-18 DIAGNOSIS — F17200 Nicotine dependence, unspecified, uncomplicated: Secondary | ICD-10-CM | POA: Insufficient documentation

## 2013-11-18 DIAGNOSIS — R7309 Other abnormal glucose: Secondary | ICD-10-CM

## 2013-11-18 DIAGNOSIS — Z862 Personal history of diseases of the blood and blood-forming organs and certain disorders involving the immune mechanism: Secondary | ICD-10-CM | POA: Insufficient documentation

## 2013-11-18 DIAGNOSIS — L03317 Cellulitis of buttock: Principal | ICD-10-CM

## 2013-11-18 DIAGNOSIS — Z794 Long term (current) use of insulin: Secondary | ICD-10-CM | POA: Insufficient documentation

## 2013-11-18 DIAGNOSIS — L0231 Cutaneous abscess of buttock: Secondary | ICD-10-CM | POA: Insufficient documentation

## 2013-11-18 DIAGNOSIS — Z8719 Personal history of other diseases of the digestive system: Secondary | ICD-10-CM | POA: Insufficient documentation

## 2013-11-18 DIAGNOSIS — Z8639 Personal history of other endocrine, nutritional and metabolic disease: Secondary | ICD-10-CM | POA: Insufficient documentation

## 2013-11-18 DIAGNOSIS — E119 Type 2 diabetes mellitus without complications: Secondary | ICD-10-CM | POA: Insufficient documentation

## 2013-11-18 DIAGNOSIS — Z8614 Personal history of Methicillin resistant Staphylococcus aureus infection: Secondary | ICD-10-CM | POA: Insufficient documentation

## 2013-11-18 DIAGNOSIS — R109 Unspecified abdominal pain: Secondary | ICD-10-CM | POA: Insufficient documentation

## 2013-11-18 LAB — CBG MONITORING, ED: Glucose-Capillary: 256 mg/dL — ABNORMAL HIGH (ref 70–99)

## 2013-11-18 MED ORDER — IBUPROFEN 800 MG PO TABS: 800.0000 mg | ORAL_TABLET | Freq: Three times a day (TID) | ORAL | Status: DC

## 2013-11-18 MED ORDER — HYDROCODONE-ACETAMINOPHEN 5-325 MG PO TABS: 1.0000 | ORAL_TABLET | Freq: Four times a day (QID) | ORAL | Status: DC | PRN

## 2013-11-18 MED ORDER — LIDOCAINE-EPINEPHRINE 1 %-1:100000 IJ SOLN
10.0000 mL | Freq: Once | INTRAMUSCULAR | Status: AC
  Administered 2013-11-18: 10 mL via INTRADERMAL
  Filled 2013-11-18: qty 1

## 2013-11-18 NOTE — Discharge Instructions (Signed)
Call for a follow up appointment with a Family or Primary Care Provider for further evaluation of your elevated glucose. Return if Symptoms worsen.   Take medication as prescribed.  Return in 48 hours for wound check and packing removal. Do not soak the wound until the packing has been removed. Keep the wound clean and dry. Change dressing 2-3 times a day.

## 2013-11-18 NOTE — ED Notes (Signed)
Pt reports abscess to left waist/groin area. Pt reports green drainage, denies fever or chills.

## 2013-11-18 NOTE — ED Provider Notes (Signed)
CSN: [id number]     Arrival date & time 11/18/13  2039 History   First MD Initiated Contact with Patient 11/18/13 2249     Chief Complaint  Patient presents with  . Abscess     (Consider location/radiation/quality/duration/timing/severity/associated sxs/prior Treatment) HPI Comments: Patient is a 38 year old male with past medical history of diabetes, presenting to emergency room chief complaint of recurrent abscess to lower abdomen/groin for 3 days. The patient reports similar discomfort in the past. Reports swelling, pain, drainage at the site denies fever, chills. Reports blood sugars running approximately 270, reports noncompliance with Novolin today due to forgetting.  The history is provided by the patient. No language interpreter was used.    Past Medical History  Diagnosis Date  . Diabetes mellitus   . Hypertension   . Obesity   . Hypercholesteremia   . MRSA (methicillin resistant staph aureus) culture positive   . Pancreatitis   . Obesity    Past Surgical History  Procedure Laterality Date  . Testicle removal  right testicle   No family history on file. History  Substance Use Topics  . Smoking status: Current Some Day Smoker  . Smokeless tobacco: Not on file  . Alcohol Use: No    Review of Systems  Constitutional: Negative for fever and chills.  Skin: Positive for wound.      Allergies  Review of patient's allergies indicates no known allergies.  Home Medications   Prior to Admission medications   Medication Sig Start Date End Date Taking? Authorizing Provider  CINNAMON PO Take 1 tablet by mouth daily.   Yes Historical Provider, MD  Fish Oil-Cholecalciferol (FISH OIL + D3 PO) Take 2 capsules by mouth daily.   Yes Historical Provider, MD  ibuprofen (ADVIL,MOTRIN) 200 MG tablet Take 400 mg by mouth every 6 (six) hours as needed for mild pain or moderate pain.   Yes Historical Provider, MD  insulin NPH-insulin regular (NOVOLIN 70/30) (70-30) 100 UNIT/ML  injection Inject 80 Units into the skin 2 (two) times daily with a meal.    Yes Historical Provider, MD  multivitamin (ONE-A-DAY MEN'S) TABS tablet Take 1 tablet by mouth daily.   Yes Historical Provider, MD  vitamin C (ASCORBIC ACID) 500 MG tablet Take 500 mg by mouth daily.   Yes Historical Provider, MD   BP 152/93  Pulse 95  Temp(Src) 99.2 F (37.3 C) (Oral)  Resp 14  Ht  (1.676 m)  Wt 301 lb (136.533 kg)  BMI 48.61 kg/m2  SpO2 94% Physical Exam  Nursing note and vitals reviewed. Constitutional: He appears well-developed and well-nourished. No distress.  HENT:  Head: Normocephalic and atraumatic.  Neck: Neck supple.  Skin: Skin is warm and dry.     Left lower abdomen, under abdominal pannus 3 x 3cm area of induration with central fluctuance. Tenderness to palpation. No overlying erythema Drainage at site approximately 3 cm away, watery and purulent.   Psychiatric: He has a normal mood and affect. His behavior is normal. Thought content normal.    ED Course  INCISION AND DRAINAGE Date/Time: 11/18/2013 11:49 PM Performed by: Mellody Drown Authorized by: Mellody Drown Consent: Verbal consent obtained. Risks and benefits: risks, benefits and alternatives were discussed Consent given by: patient Patient understanding: patient states understanding of the procedure being performed Patient consent: the patient's understanding of the procedure matches consent given Procedure consent: procedure consent matches procedure scheduled Imaging studies: imaging studies available Required items: required blood products, implants, devices, and special equipment available  Patient identity confirmed: verbally with patient and arm band Time out: Immediately prior to procedure a "time out" was called to verify the correct patient, procedure, equipment, support staff and site/side marked as required. Type: abscess Body area: trunk Location details: abdomen Anesthesia: local  infiltration Local anesthetic: lidocaine 1% with epinephrine Anesthetic total: 9 ml Patient sedated: no Scalpel size: 11 Needle gauge: 22 Drainage: purulent and  bloody Drainage amount: scant Wound treatment: wound left open and  drain placed Packing material: 1/4 in iodoform gauze Patient tolerance: Patient tolerated the procedure well with no immediate complications.   (including critical care time) Labs Review Labs Reviewed  CBG MONITORING, ED - Abnormal; Notable for the following:    Glucose-Capillary 256 (*)    All other components within normal limits   EMERGENCY DEPARTMENT US SOFT TISSUE INTERPRETATION "Study: Limited Ultrasound of the noted body part in comments below"  INDICATIONS: Soft tissue infection Multiple views of the body part are obtained with a multi-frequency linear probe  PERFORMED BY:  Myself  IMAGES ARCHIVED?: Yes  SIDE:Left  BODY PART:Abdominal wall  FINDINGS: Abcess present and Cellulitis absent  LIMITATIONS:  Body Habitus  INTERPRETATION:  Abcess present and No cellulitis noted  COMMENT:    Imaging Review No results found.   EKG Interpretation None      MDM   Final diagnoses:  Abscess  Elevated glucose   Patient with skin abscess amenable to incision and drainage. Glucose 260 and encouraged compliance with Novolin. Packed with iodoform,  wound recheck in 2 days.  Nosigns of cellulitis is surrounding skin.  Will d/c to home.  No antibiotic therapy is indicated. Meds given in ED:  Medications  lidocaine-EPINEPHrine (XYLOCAINE W/EPI) 1 %-1:100000 (with pres) injection 10 mL (10 mLs Intradermal Given 11/18/13 2310)    Discharge Medication List as of 11/18/2013 11:56 PM    START taking these medications   Details  HYDROcodone-acetaminophen (NORCO) 5-325 MG per tablet Take 1 tablet by mouth every 6 (six) hours as needed for moderate pain., Starting 11/18/2013, Until Discontinued, Print    !! ibuprofen (ADVIL,MOTRIN) 800 MG tablet  Take 1 tablet (800 mg total) by mouth 3 (three) times daily with meals., Starting 11/18/2013, Until Discontinued, Print     !! - Potential duplicate medications found. Please discuss with provider.           Mellody Drown, PA-C 11/19/13 [phone]

## 2013-11-18 NOTE — ED Notes (Signed)
Pt. 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

## 2013-11-18 NOTE — ED Notes (Signed)
CBG result 256 mg/dL reported to Patsy Lager by B. Bing Plume, EMT

## 2013-11-19 NOTE — ED Provider Notes (Signed)
Medical screening examination/treatment/procedure(s) were performed by non-physician practitioner and as supervising physician I was immediately available for consultation/collaboration.   EKG Interpretation None        Conna Terada, MD 11/19/13 0102 

## 2014-05-23 ENCOUNTER — Emergency Department (HOSPITAL_COMMUNITY): Payer: Medicaid Other

## 2014-05-23 ENCOUNTER — Emergency Department (HOSPITAL_COMMUNITY)
Admission: EM | Admit: 2014-05-23 | Discharge: 2014-05-23 | Disposition: A | Discharge status: Home / Self Care | Payer: Medicaid Other | Attending: Emergency Medicine | Admitting: Emergency Medicine

## 2014-05-23 ENCOUNTER — Encounter (HOSPITAL_COMMUNITY): Payer: Self-pay | Admitting: Emergency Medicine

## 2014-05-23 DIAGNOSIS — M545 Low back pain, unspecified: Secondary | ICD-10-CM

## 2014-05-23 DIAGNOSIS — Z8719 Personal history of other diseases of the digestive system: Secondary | ICD-10-CM | POA: Insufficient documentation

## 2014-05-23 DIAGNOSIS — E669 Obesity, unspecified: Secondary | ICD-10-CM | POA: Insufficient documentation

## 2014-05-23 DIAGNOSIS — Y9289 Other specified places as the place of occurrence of the external cause: Secondary | ICD-10-CM | POA: Insufficient documentation

## 2014-05-23 DIAGNOSIS — Z720 Tobacco use: Secondary | ICD-10-CM | POA: Insufficient documentation

## 2014-05-23 DIAGNOSIS — W1830XA Fall on same level, unspecified, initial encounter: Secondary | ICD-10-CM | POA: Insufficient documentation

## 2014-05-23 DIAGNOSIS — S3992XA Unspecified injury of lower back, initial encounter: Secondary | ICD-10-CM | POA: Insufficient documentation

## 2014-05-23 DIAGNOSIS — E119 Type 2 diabetes mellitus without complications: Secondary | ICD-10-CM | POA: Insufficient documentation

## 2014-05-23 DIAGNOSIS — I10 Essential (primary) hypertension: Secondary | ICD-10-CM | POA: Insufficient documentation

## 2014-05-23 DIAGNOSIS — Z794 Long term (current) use of insulin: Secondary | ICD-10-CM | POA: Insufficient documentation

## 2014-05-23 DIAGNOSIS — Z8614 Personal history of Methicillin resistant Staphylococcus aureus infection: Secondary | ICD-10-CM | POA: Insufficient documentation

## 2014-05-23 DIAGNOSIS — Y9389 Activity, other specified: Secondary | ICD-10-CM | POA: Insufficient documentation

## 2014-05-23 DIAGNOSIS — Y998 Other external cause status: Secondary | ICD-10-CM | POA: Insufficient documentation

## 2014-05-23 MED ORDER — MELOXICAM 7.5 MG PO TABS: 15.0000 mg | ORAL_TABLET | Freq: Every day | ORAL | Status: DC

## 2014-05-23 MED ORDER — HYDROCODONE-ACETAMINOPHEN 5-325 MG PO TABS: 1.0000 | ORAL_TABLET | Freq: Four times a day (QID) | ORAL | Status: DC | PRN

## 2014-05-23 MED ORDER — OXYCODONE-ACETAMINOPHEN 5-325 MG PO TABS
2.0000 | ORAL_TABLET | Freq: Once | ORAL | Status: AC
  Administered 2014-05-23: 2 via ORAL
  Filled 2014-05-23: qty 2

## 2014-05-23 MED ORDER — METHOCARBAMOL 500 MG PO TABS
500.0000 mg | ORAL_TABLET | Freq: Once | ORAL | Status: AC
  Administered 2014-05-23: 500 mg via ORAL
  Filled 2014-05-23: qty 1

## 2014-05-23 NOTE — ED Notes (Signed)
Pt reports he fell Saturday morning and is currently having lower back pain. Pt states his back clicks when he walks.

## 2014-05-23 NOTE — ED Provider Notes (Signed)
CSN: [id number]     Arrival date & time 05/23/14  2034 History  This chart was scribed for non-physician practitioner, Antony Madura, PA-C, working with Tilden Fossa, MD, by Modena Jansky, ED Scribe. This patient was seen in room WTR5/WTR5 and the patient's care was started at 10:09 PM.     Chief Complaint  Patient presents with  . Back Pain   Patient is a 39 y.o. male presenting with back pain. The history is provided by the patient. No language interpreter was used.  Back Pain Location:  Lumbar spine Pain severity:  Moderate Onset quality:  Sudden Duration:  3 days Timing:  Constant Progression:  Unchanged Chronicity:  New Context: falling   Relieved by:  Nothing Worsened by:  Ambulation and lying down Ineffective treatments:  NSAIDs and cold packs Associated symptoms: no bladder incontinence and no bowel incontinence    HPI Comments: [name] is a 39 y.o. male who presents to the Emergency Department complaining of constant moderate lower back pain that started 2 days ago. He states that he fell on his buttock 2 days ago, and have been having lower back pain since then. He denies any LOC or head injury. He states that ambulation and laying down for long periods exacerbates the pain. He reports that he has been using advil and ice without any relief. He states that he has an associated tingling sensation in his back. He denies any prior hx of back pain. He also denies any bowel or bladder incontinence, or loss of sensation in LE.    Past Medical History  Diagnosis Date  . Diabetes mellitus   . Hypertension   . Obesity   . Hypercholesteremia   . MRSA (methicillin resistant staph aureus) culture positive   . Pancreatitis   . Obesity    Past Surgical History  Procedure Laterality Date  . Testicle removal  right testicle   History reviewed. No pertinent family history. History  Substance Use Topics  . Smoking status: Current Some Day Smoker  . Smokeless tobacco: Not on  file  . Alcohol Use: No    Review of Systems  Gastrointestinal: Negative for bowel incontinence.  Genitourinary: Negative for bladder incontinence.  Musculoskeletal: Positive for back pain.  Neurological: Negative for syncope.  All other systems reviewed and are negative.   Allergies  Review of patient's allergies indicates no known allergies.  Home Medications   Prior to Admission medications   Medication Sig Start Date End Date Taking? Authorizing Provider  ibuprofen (ADVIL,MOTRIN) 200 MG tablet Take 400 mg by mouth every 6 (six) hours as needed for mild pain or moderate pain (pain).    Yes Historical Provider, MD  insulin NPH-insulin regular (NOVOLIN 70/30) (70-30) 100 UNIT/ML injection Inject 80 Units into the skin 2 (two) times daily with a meal.    Yes Historical Provider, MD  vitamin C (ASCORBIC ACID) 500 MG tablet Take 500 mg by mouth daily.   Yes Historical Provider, MD  HYDROcodone-acetaminophen (NORCO) 5-325 MG per tablet Take 1-2 tablets by mouth every 6 (six) hours as needed for moderate pain or severe pain. 05/23/14   Antony Madura, PA-C  ibuprofen (ADVIL,MOTRIN) 800 MG tablet Take 1 tablet (800 mg total) by mouth 3 (three) times daily with meals. Patient not taking: Reported on 05/23/2014 11/18/13   Mellody Drown, PA-C  meloxicam (MOBIC) 7.5 MG tablet Take 2 tablets (15 mg total) by mouth daily. 05/23/14   Antony Madura, PA-C   BP 160/101 mmHg  Pulse 99  Temp(Src) 98.6 F (37 C) (Oral)  Resp 18  SpO2 94%  Physical Exam  Constitutional: He is oriented to person, place, and time. He appears well-developed and well-nourished. No distress.  Nontoxic/nonseptic appearing  HENT:  Head: Normocephalic and atraumatic.  Eyes: Conjunctivae and EOM are normal. No scleral icterus.  Neck: Normal range of motion.  Cardiovascular: Normal rate, regular rhythm and intact distal pulses.   DP and PT pulses 2+ bilaterally.  Pulmonary/Chest: Effort normal. No respiratory distress.   Musculoskeletal: Normal range of motion.       Lumbar back: He exhibits tenderness and bony tenderness. He exhibits no swelling, no laceration, no pain and no spasm.       Back:  No bony deformities, step-offs, or crepitus to the lumbar midline.  Neurological: He is alert and oriented to person, place, and time. He exhibits normal muscle tone. Coordination normal.  Sensation to light touch intact. Patient ambulatory with steady gait.  Skin: Skin is warm and dry. No rash noted. He is not diaphoretic. No erythema. No pallor.  Psychiatric: He has a normal mood and affect. His behavior is normal.  Nursing note and vitals reviewed.   ED Course  Procedures (including critical care time) DIAGNOSTIC STUDIES: Oxygen Saturation is 94% on RA, normal by my interpretation.    COORDINATION OF CARE: 10:13 PM- Pt advised of plan for treatment which includes medication and radiology and pt agrees.  Labs Review Labs Reviewed - No data to display  Imaging Review Dg Lumbar Spine Complete  05/23/2014   CLINICAL DATA:  Larey SeatFell 2 days ago.  Persistent back pain.  EXAM: LUMBAR SPINE - COMPLETE 4+ VIEW  COMPARISON:  None.  FINDINGS: Degenerative lumbar spondylosis with multilevel disc disease and facet disease. Left convex scoliosis. No acute fracture. The visualized bony pelvis is intact. Bridging osteophytes noted in the lower thoracic spine. Aortic calcifications without definite aneurysm.  IMPRESSION: Scoliosis and degenerative lumbar spondylosis but no acute bony findings.   Electronically Signed   By: Rudie MeyerP.  Gallerani [name]   On: 05/23/2014 23:04   Dg Sacrum/coccyx  05/23/2014   CLINICAL DATA:  Larey SeatFell 2 days ago  EXAM: SACRUM AND COCCYX - 2+ VIEW  COMPARISON:  None  FINDINGS: The sacrum, coccyx and sacroiliac joints are intact with no evidence of acute fracture.  IMPRESSION: Negative.   Electronically Signed   By: Ellery Plunkaniel R Mitchell [name]   On: 05/23/2014 23:03     EKG Interpretation None      MDM   Final  diagnoses:  Bilateral low back pain without sciatica    Patient with back pain 2/2 fall 2 days ago. Patient neurovascularly intact. He is ambulatory and without sensory deficits. No loss of bowel or bladder control. No concern for cauda equina. No fever, h/o cancer, or h/o IVDU. Imaging negative for acute bony changes or fracture. RICE protocol and pain medicine indicated and discussed with patient. Orthopedic referral given if symptoms persist. Return precautions provided. Patient agreeable to plan with no unaddressed concerns. Patient discharged in good condition.   I personally performed the services described in this documentation, which was scribed in my presence. The recorded information has been reviewed and is accurate.   Filed Vitals:   05/23/14 2041  BP: 160/101  Pulse: 99  Temp: 98.6 F (37 C)  TempSrc: Oral  Resp: 18  SpO2: 94%      Antony MaduraKelly Lorea Kupfer, PA-C 05/23/14 2330  Tilden FossaElizabeth Rees, MD 05/23/14 [phone]

## 2014-05-23 NOTE — Discharge Instructions (Signed)
Recommend Mobic as prescribed as well as alternation of ice and heat to your back 3-4 times per day. Take Norco as needed for severe pain. Follow-up with an orthopedist if symptoms persist. Return to the emergency department as needed if symptoms worsen.   Back Pain, Adult Low back pain is very common. About 1 in 5 people have back pain.The cause of low back pain is rarely dangerous. The pain often gets better over time.About half of people with a sudden onset of back pain feel better in just 2 weeks. About 8 in 10 people feel better by 6 weeks.  CAUSES Some common causes of back pain include:  Strain of the muscles or ligaments supporting the spine.  Wear and tear (degeneration) of the spinal discs.  Arthritis.  Direct injury to the back. DIAGNOSIS Most of the time, the direct cause of low back pain is not known.However, back pain can be treated effectively even when the exact cause of the pain is unknown.Answering your caregiver's questions about your overall health and symptoms is one of the most accurate ways to make sure the cause of your pain is not dangerous. If your caregiver needs more information, he or she may order lab work or imaging tests (X-rays or MRIs).However, even if imaging tests show changes in your back, this usually does not require surgery. HOME CARE INSTRUCTIONS For many people, back pain returns.Since low back pain is rarely dangerous, it is often a condition that people can learn to [facility] their own.   Remain active. It is stressful on the back to sit or stand in one place. Do not sit, drive, or stand in one place for more than 30 minutes at a time. Take short walks on level surfaces as soon as pain allows.Try to increase the length of time you walk each day.  Do not stay in bed.Resting more than 1 or 2 days can delay your recovery.  Do not avoid exercise or work.Your body is made to move.It is not dangerous to be active, even though your back may  hurt.Your back will likely heal faster if you return to being active before your pain is gone.  Pay attention to your body when you bend and lift. Many people have less discomfortwhen lifting if they bend their knees, keep the load close to their bodies,and avoid twisting. Often, the most comfortable positions are those that put less stress on your recovering back.  Find a comfortable position to sleep. Use a firm mattress and lie on your side with your knees slightly bent. If you lie on your back, put a pillow under your knees.  Only take over-the-counter or prescription medicines as directed by your caregiver. Over-the-counter medicines to reduce pain and inflammation are often the most helpful.Your caregiver may prescribe muscle relaxant drugs.These medicines help dull your pain so you can more quickly return to your normal activities and healthy exercise.  Put ice on the injured area.  Put ice in a plastic bag.  Place a towel between your skin and the bag.  Leave the ice on for 15-20 minutes, 03-04 times a day for the first 2 to 3 days. After that, ice and heat may be alternated to reduce pain and spasms.  Ask your caregiver about trying back exercises and gentle massage. This may be of some benefit.  Avoid feeling anxious or stressed.Stress increases muscle tension and can worsen back pain.It is important to recognize when you are anxious or stressed and learn ways to manage  it.Exercise is a great option. SEEK MEDICAL CARE IF:  You have pain that is not relieved with rest or medicine.  You have pain that does not improve in 1 week.  You have new symptoms.  You are generally not feeling well. SEEK IMMEDIATE MEDICAL CARE IF:   You have pain that radiates from your back into your legs.  You develop new bowel or bladder control problems.  You have unusual weakness or numbness in your arms or legs.  You develop nausea or vomiting.  You develop abdominal pain.  You  feel faint. Document Released: 02/18/2005 Document Revised: 08/20/2011 Document Reviewed: 06/22/2013 [facility] Patient Information 2015 [place], [place]. This information is not intended to replace advice given to you by your health care provider. Make sure you discuss any questions you have with your health care provider.

## 2015-01-17 ENCOUNTER — Emergency Department (HOSPITAL_COMMUNITY)
Admission: EM | Admit: 2015-01-17 | Discharge: 2015-01-17 | Disposition: A | Discharge status: Home / Self Care | Payer: Medicaid Other | Attending: Emergency Medicine | Admitting: Emergency Medicine

## 2015-01-17 ENCOUNTER — Encounter (HOSPITAL_COMMUNITY): Payer: Self-pay | Admitting: Emergency Medicine

## 2015-01-17 DIAGNOSIS — E669 Obesity, unspecified: Secondary | ICD-10-CM | POA: Insufficient documentation

## 2015-01-17 DIAGNOSIS — E119 Type 2 diabetes mellitus without complications: Secondary | ICD-10-CM | POA: Insufficient documentation

## 2015-01-17 DIAGNOSIS — L02211 Cutaneous abscess of abdominal wall: Secondary | ICD-10-CM | POA: Insufficient documentation

## 2015-01-17 DIAGNOSIS — Z79899 Other long term (current) drug therapy: Secondary | ICD-10-CM | POA: Insufficient documentation

## 2015-01-17 DIAGNOSIS — Z8719 Personal history of other diseases of the digestive system: Secondary | ICD-10-CM | POA: Insufficient documentation

## 2015-01-17 DIAGNOSIS — Z791 Long term (current) use of non-steroidal anti-inflammatories (NSAID): Secondary | ICD-10-CM | POA: Insufficient documentation

## 2015-01-17 DIAGNOSIS — Z8614 Personal history of Methicillin resistant Staphylococcus aureus infection: Secondary | ICD-10-CM | POA: Insufficient documentation

## 2015-01-17 DIAGNOSIS — Z794 Long term (current) use of insulin: Secondary | ICD-10-CM | POA: Insufficient documentation

## 2015-01-17 DIAGNOSIS — F17200 Nicotine dependence, unspecified, uncomplicated: Secondary | ICD-10-CM | POA: Insufficient documentation

## 2015-01-17 DIAGNOSIS — I10 Essential (primary) hypertension: Secondary | ICD-10-CM | POA: Insufficient documentation

## 2015-01-17 LAB — CBG MONITORING, ED: GLUCOSE-CAPILLARY: 250 mg/dL — AB (ref 65–99)

## 2015-01-17 MED ORDER — CEPHALEXIN 500 MG PO CAPS: 500.0000 mg | ORAL_CAPSULE | Freq: Four times a day (QID) | ORAL | Status: DC

## 2015-01-17 MED ORDER — SULFAMETHOXAZOLE-TRIMETHOPRIM 800-160 MG PO TABS: 1.0000 | ORAL_TABLET | Freq: Two times a day (BID) | ORAL | Status: AC

## 2015-01-17 MED ORDER — PENTAFLUOROPROP-TETRAFLUOROETH EX AERO
INHALATION_SPRAY | Freq: Once | CUTANEOUS | Status: AC
  Administered 2015-01-17: 21:00:00 via TOPICAL
  Filled 2015-01-17: qty 103.5

## 2015-01-17 NOTE — Discharge Instructions (Signed)
Read the information below.  Use the prescribed medication as directed.  Please discuss all new medications with your pharmacist.  You may return to the Emergency Department at any time for worsening condition or any new symptoms that concern you.    If you develop increased redness, swelling, uncontrolled pain, or fevers greater than 100.4, return to the ER immediately for a recheck.     Abscess An abscess is an infected area that contains a collection of pus and debris.It can occur in almost any part of the body. An abscess is also known as a furuncle or boil. CAUSES  An abscess occurs when tissue gets infected. This can occur from blockage of oil or sweat glands, infection of hair follicles, or a minor injury to the skin. As the body tries to fight the infection, pus collects in the area and creates pressure under the skin. This pressure causes pain. People with weakened immune systems have difficulty fighting infections and get certain abscesses more often.  SYMPTOMS Usually an abscess develops on the skin and becomes a painful mass that is red, warm, and tender. If the abscess forms under the skin, you may feel a moveable soft area under the skin. Some abscesses break open (rupture) on their own, but most will continue to get worse without care. The infection can spread deeper into the body and eventually into the bloodstream, causing you to feel ill.  DIAGNOSIS  Your caregiver will take your medical history and perform a physical exam. A sample of fluid may also be taken from the abscess to determine what is causing your infection. TREATMENT  Your caregiver may prescribe antibiotic medicines to fight the infection. However, taking antibiotics alone usually does not cure an abscess. Your caregiver may need to make a small cut (incision) in the abscess to drain the pus. In some cases, gauze is packed into the abscess to reduce pain and to continue draining the area. HOME CARE INSTRUCTIONS   Only  take over-the-counter or prescription medicines for pain, discomfort, or fever as directed by your caregiver.  If you were prescribed antibiotics, take them as directed. Finish them even if you start to feel better.  If gauze is used, follow your caregiver's directions for changing the gauze.  To avoid spreading the infection:  Keep your draining abscess covered with a bandage.  Wash your hands well.  Do not share personal care items, towels, or whirlpools with others.  Avoid skin contact with others.  Keep your skin and clothes clean around the abscess.  Keep all follow-up appointments as directed by your caregiver. SEEK MEDICAL CARE IF:   You have increased pain, swelling, redness, fluid drainage, or bleeding.  You have muscle aches, chills, or a general ill feeling.  You have a fever. MAKE SURE YOU:   Understand these instructions.  Will watch your condition.  Will get help right away if you are not doing well or get worse.   This information is not intended to replace advice given to you by your health care provider. Make sure you discuss any questions you have with your health care provider.   Document Released: 11/28/2004 Document Revised: 08/20/2011 Document Reviewed: 05/03/2011 Elsevier Interactive Patient Education 2016 [employer]  Incision and Drainage Incision and drainage is a procedure in which a sac-like structure (cystic structure) is opened and drained. The area to be drained usually contains material such as pus, fluid, or blood.  LET YOUR CAREGIVER KNOW ABOUT:   Allergies to medicine.  Medicines taken, including vitamins, herbs, eyedrops, over-the-counter medicines, and creams.  Use of steroids (by mouth or creams).  Previous problems with anesthetics or numbing medicines.  History of bleeding problems or blood clots.  Previous surgery.  Other health problems, including diabetes and kidney problems.  Possibility of pregnancy, if this  applies. RISKS AND COMPLICATIONS  Pain.  Bleeding.  Scarring.  Infection. BEFORE THE PROCEDURE  You may need to have an ultrasound or other imaging tests to see how large or deep your cystic structure is. Blood tests may also be used to determine if you have an infection or how severe the infection is. You may need to have a tetanus shot. PROCEDURE  The affected area is cleaned with a cleaning fluid. The cyst area will then be numbed with a medicine (local anesthetic). A small incision will be made in the cystic structure. A syringe or catheter may be used to drain the contents of the cystic structure, or the contents may be squeezed out. The area will then be flushed with a cleansing solution. After cleansing the area, it is often gently packed with a gauze or another wound dressing. Once it is packed, it will be covered with gauze and tape or some other type of wound dressing. AFTER THE PROCEDURE   Often, you will be allowed to go home right after the procedure.  You may be given antibiotic medicine to prevent or heal an infection.  If the area was packed with gauze or some other wound dressing, you will likely need to come back in 1 to 2 days to get it removed.  The area should heal in about 14 days.   This information is not intended to replace advice given to you by your health care provider. Make sure you discuss any questions you have with your health care provider.   Document Released: 08/14/2000 Document Revised: 08/20/2011 Document Reviewed: 04/15/2011 Elsevier Interactive Patient Education [employer]

## 2015-01-17 NOTE — ED Notes (Signed)
CBG 250 

## 2015-01-17 NOTE — ED Provider Notes (Signed)
CSN: [id number]     Arrival date & time 01/17/15  1950 History  By signing my name below, I, [name], attest that this documentation has been prepared under the direction and in the presence of Jason Womac, PA-C. Electronically Signed: Angelene GiovanniEmmanuella [name], ED Scribe. 01/17/2015. 8:54 PM.     Chief Complaint  Patient presents with  . Abscess   The history is provided by the patient. No language interpreter was used.   HPI Comments: [name] is a 39 y.o. male with a hx of DM, HTN, obesity, hypercholesteremia who presents to the Emergency Department complaining of a gradually worsening non-draining abscess on right lower abdomen onset 3 days ago. He denies that the pain is deep. He also denies any fever, chills, myalgia, abdominal pain, n/v, or any urinary or bowel symptoms. No alleviating factors noted. He states that he has a hx of abscess. He states that he is currently on Novolin but did not take a dose PTA this evening as he was trying to get to the ER and forgot. Pt's tetanus vaccine is UTD.   Past Medical History  Diagnosis Date  . Diabetes mellitus   . Hypertension   . Obesity   . Hypercholesteremia   . MRSA (methicillin resistant staph aureus) culture positive   . Pancreatitis   . Obesity    Past Surgical History  Procedure Laterality Date  . Testicle removal  right testicle   No family history on file. Social History  Substance Use Topics  . Smoking status: Current Some Day Smoker  . Smokeless tobacco: None  . Alcohol Use: No    Review of Systems  Constitutional: Negative for fever and chills.  Gastrointestinal: Negative for nausea, vomiting, abdominal pain and diarrhea.  Genitourinary: Negative for dysuria, frequency and hematuria.  Musculoskeletal: Negative for myalgias.  Skin: Positive for color change.       Abscess  Allergic/Immunologic: Negative for immunocompromised state.  Hematological: Does not bruise/bleed easily.  Psychiatric/Behavioral:  Negative for self-injury.      Allergies  Review of patient's allergies indicates no known allergies.  Home Medications   Prior to Admission medications   Medication Sig Start Date End Date Taking? Authorizing Provider  HYDROcodone-acetaminophen (NORCO) 5-325 MG per tablet Take 1-2 tablets by mouth every 6 (six) hours as needed for moderate pain or severe pain. 05/23/14   Antony MaduraKelly Humes, PA-C  ibuprofen (ADVIL,MOTRIN) 200 MG tablet Take 400 mg by mouth every 6 (six) hours as needed for mild pain or moderate pain (pain).     Historical Provider, MD  ibuprofen (ADVIL,MOTRIN) 800 MG tablet Take 1 tablet (800 mg total) by mouth 3 (three) times daily with meals. Patient not taking: Reported on 05/23/2014 11/18/13   Mellody DrownLauren Parker, PA-C  insulin NPH-insulin regular (NOVOLIN 70/30) (70-30) 100 UNIT/ML injection Inject 80 Units into the skin 2 (two) times daily with a meal.     Historical Provider, MD  meloxicam (MOBIC) 7.5 MG tablet Take 2 tablets (15 mg total) by mouth daily. 05/23/14   Antony MaduraKelly Humes, PA-C  vitamin C (ASCORBIC ACID) 500 MG tablet Take 500 mg by mouth daily.    Historical Provider, MD   BP 166/102 mmHg  Pulse 101  Temp(Src) 98.4 F (36.9 C) (Oral)  Resp 16  SpO2 96% Physical Exam  Constitutional: He appears well-developed and well-nourished. No distress.  HENT:  Head: Normocephalic and atraumatic.  Neck: Neck supple.  Pulmonary/Chest: Effort normal.  Abdominal: Soft. He exhibits no distension. There is no tenderness.  There is no rebound and no guarding.  Pt is morbidly obese, abscess is located in the crease under his pannus.  Soft, easily reducible umbilical hernia  Neurological: He is alert.  Skin: He is not diaphoretic.  Area in the right lower quadrant, right suprapubic area with fluctuance, overlying erythema, TTP.   Nursing note and vitals reviewed.   ED Course  Procedures (including critical care time) DIAGNOSTIC STUDIES: Oxygen Saturation is 96% on RA, adequate by  my interpretation.    COORDINATION OF CARE: 8:22 PM- Pt advised of plan for treatment and pt agrees. Offered local anesthesia with an I&D but pt declines local anethesia. Pt will be placed on antibiotics and receive pain medication.    8:51 PM - I&D performed. Advised to return if he develops a fever or if redness spreads.    Labs Review Labs Reviewed  CBG MONITORING, ED - Abnormal; Notable for the following:    Glucose-Capillary 250 (*)    All other components within normal limits    Imaging Review No results found.   Trixie Dredge, PA-C has personally reviewed and evaluated these lab results as part of her medical decision-making.   EKG Interpretation None       EMERGENCY DEPARTMENT US SOFT TISSUE INTERPRETATION "Study: Limited Ultrasound of the noted body part in comments below"  INDICATIONS: Soft tissue infection Multiple views of the body part are obtained with a multi-frequency linear probe  PERFORMED BY:  Myself  IMAGES ARCHIVED?: Yes  SIDE:Right   BODY PART:Abdominal wall  FINDINGS: Abcess present and Cellulitis present  LIMITATIONS:  Body Habitus and Emergent Procedure  INTERPRETATION:  Abcess present and Cellulitis present  COMMENT:  Small area of cellulitis and contained abscess of abdominal wall     INCISION AND DRAINAGE Performed by: Trixie Dredge Consent: Verbal consent obtained. Risks and benefits: risks, benefits and alternatives were discussed Type: abscess  Body area: right lower abdominal wall under panniculus   Anesthesia: topical spray   Incision was made with a scalpel.   Complexity: complex Blunt dissection to break up loculations  Drainage: purulent  Drainage amount: moderate  Packing material: none  Abscess cavity irrigated with normal saline.   Patient tolerance: Patient tolerated the procedure well with no immediate complications.     MDM   Final diagnoses:  Abdominal wall abscess    Afebrile, nontoxic patient  with small abdominal wall abscess and localized cellulitis in crease under panniculus.  I&D in ED.  Pt is diabetic, asymptomatic hyperglycemia - pt has not yet taken his evening diabetes medication.  D/C home with keflex, bactrim.  Pt declined pain medication.  Discussed return precautions, home care.   Discussed result, findings, treatment, and follow up  with patient.  Pt given return precautions.  Pt verbalizes understanding and agrees with plan.       I personally performed the services described in this documentation, which was scribed in my presence. The recorded information has been reviewed and is accurate.   Trixie Dredge, PA-C 01/17/15 2123  Richardean Canal, MD 01/17/15 2206

## 2015-01-17 NOTE — ED Notes (Signed)
Pt. reports skin abscess at right lower abdomen onset 3 days ago with no drainage .

## 2015-10-11 ENCOUNTER — Encounter (HOSPITAL_COMMUNITY): Payer: Self-pay | Admitting: Emergency Medicine

## 2015-10-11 DIAGNOSIS — E119 Type 2 diabetes mellitus without complications: Secondary | ICD-10-CM | POA: Insufficient documentation

## 2015-10-11 DIAGNOSIS — K429 Umbilical hernia without obstruction or gangrene: Secondary | ICD-10-CM | POA: Insufficient documentation

## 2015-10-11 DIAGNOSIS — I10 Essential (primary) hypertension: Secondary | ICD-10-CM | POA: Insufficient documentation

## 2015-10-11 DIAGNOSIS — F17200 Nicotine dependence, unspecified, uncomplicated: Secondary | ICD-10-CM | POA: Insufficient documentation

## 2015-10-11 DIAGNOSIS — Z79899 Other long term (current) drug therapy: Secondary | ICD-10-CM | POA: Insufficient documentation

## 2015-10-11 DIAGNOSIS — Z794 Long term (current) use of insulin: Secondary | ICD-10-CM | POA: Insufficient documentation

## 2015-10-11 LAB — COMPREHENSIVE METABOLIC PANEL
ALT: 16 U/L — AB (ref 17–63)
AST: 15 U/L (ref 15–41)
Albumin: 3.3 g/dL — ABNORMAL LOW (ref 3.5–5.0)
Alkaline Phosphatase: 85 U/L (ref 38–126)
Anion gap: 6 (ref 5–15)
BILIRUBIN TOTAL: 0.3 mg/dL (ref 0.3–1.2)
BUN: 19 mg/dL (ref 6–20)
CHLORIDE: 104 mmol/L (ref 101–111)
CO2: 25 mmol/L (ref 22–32)
CREATININE: 0.74 mg/dL (ref 0.61–1.24)
Calcium: 8.4 mg/dL — ABNORMAL LOW (ref 8.9–10.3)
GLUCOSE: 346 mg/dL — AB (ref 65–99)
POTASSIUM: 3.8 mmol/L (ref 3.5–5.1)
Sodium: 135 mmol/L (ref 135–145)
Total Protein: 6.3 g/dL — ABNORMAL LOW (ref 6.5–8.1)

## 2015-10-11 LAB — CBC
HEMATOCRIT: 45.7 % (ref 39.0–52.0)
Hemoglobin: 15.5 g/dL (ref 13.0–17.0)
MCH: 31.1 pg (ref 26.0–34.0)
MCHC: 33.9 g/dL (ref 30.0–36.0)
MCV: 91.8 fL (ref 78.0–100.0)
PLATELETS: 88 10*3/uL — AB (ref 150–400)
RBC: 4.98 MIL/uL (ref 4.22–5.81)
RDW: 13.5 % (ref 11.5–15.5)
WBC: 8.3 10*3/uL (ref 4.0–10.5)

## 2015-10-11 LAB — URINALYSIS, ROUTINE W REFLEX MICROSCOPIC
BILIRUBIN URINE: NEGATIVE
HGB URINE DIPSTICK: NEGATIVE
KETONES UR: NEGATIVE mg/dL
Leukocytes, UA: NEGATIVE
Nitrite: NEGATIVE
PH: 5.5 (ref 5.0–8.0)
Protein, ur: NEGATIVE mg/dL
Specific Gravity, Urine: 1.037 — ABNORMAL HIGH (ref 1.005–1.030)

## 2015-10-11 LAB — LIPASE, BLOOD: LIPASE: 21 U/L (ref 11–51)

## 2015-10-11 LAB — URINE MICROSCOPIC-ADD ON: Bacteria, UA: NONE SEEN

## 2015-10-11 NOTE — ED Triage Notes (Signed)
Pt. reports umbilical pain with mild nausea and constipation onset this week  , denies emesis or diarrhea , no fever or chills. Pt. stated history of umbilical hernia .

## 2015-10-12 ENCOUNTER — Emergency Department (HOSPITAL_COMMUNITY): Payer: Self-pay

## 2015-10-12 ENCOUNTER — Emergency Department (HOSPITAL_COMMUNITY)
Admission: EM | Admit: 2015-10-12 | Discharge: 2015-10-12 | Disposition: A | Discharge status: Home / Self Care | Payer: Self-pay | Attending: Emergency Medicine | Admitting: Emergency Medicine

## 2015-10-12 ENCOUNTER — Encounter (HOSPITAL_COMMUNITY): Payer: Self-pay | Admitting: Radiology

## 2015-10-12 DIAGNOSIS — K429 Umbilical hernia without obstruction or gangrene: Secondary | ICD-10-CM

## 2015-10-12 HISTORY — DX: Umbilical hernia without obstruction or gangrene: K42.9

## 2015-10-12 MED ORDER — MORPHINE SULFATE (PF) 4 MG/ML IV SOLN
4.0000 mg | Freq: Once | INTRAVENOUS | Status: AC
Start: 2015-10-12 — End: 2015-10-12
  Administered 2015-10-12: 4 mg via INTRAVENOUS
  Filled 2015-10-12: qty 1

## 2015-10-12 MED ORDER — ONDANSETRON HCL 4 MG/2ML IJ SOLN
4.0000 mg | Freq: Once | INTRAMUSCULAR | Status: AC
  Administered 2015-10-12: 4 mg via INTRAVENOUS
  Filled 2015-10-12: qty 2

## 2015-10-12 MED ORDER — SODIUM CHLORIDE 0.9 % IV BOLUS (SEPSIS)
1000.0000 mL | Freq: Once | INTRAVENOUS | Status: AC
  Administered 2015-10-12: 1000 mL via INTRAVENOUS

## 2015-10-12 MED ORDER — IOPAMIDOL (ISOVUE-300) INJECTION 61%
INTRAVENOUS | Status: AC
  Administered 2015-10-12: 100 mL
  Filled 2015-10-12: qty 100

## 2015-10-12 NOTE — ED Notes (Signed)
EDP at bedside  

## 2015-10-12 NOTE — ED Provider Notes (Signed)
[facility] Provider Note   CSN: [id number] Arrival date & time: 10/11/15  1955  First Provider Contact:   First MD Initiated Contact with Patient 10/12/15 0144     By signing my name below, I, [name], attest that this documentation has been prepared under the direction and in the presence of Jason Guiseana Duo Junius Faucett, MD.  Electronically Signed: Octavia HeirArianna [name], ED Scribe. 10/12/15. 2:00 AM.    History   Chief Complaint Chief Complaint  Patient presents with  . Abdominal Pain     The history is provided by the patient. No language interpreter was used.   HPI Comments: [name] is a 40 y.o. male who has a PMhx of DM, hypercholesteremia, HTN, MRSA, and pancreatitis presents to the Emergency Department complaining of waxing and waning, gradual worsening, moderate umbilical abdominal pain onset 3 days ago. He notes associated nausea and erythema to the area. Pt has an umbilical hernia which he has had for awhile but states that it has been giving him increased pain for the past 3 days. He says laying down alleviates his pain but ambulating and bending over increases his pain. Pt has not had any abdominal surgeries. He denies loss of appetite, vomiting, diarrhea, melena, blood in stool, burning with urination, urinary frequency, hematuria, fever, or chills.   Past Medical History:  Diagnosis Date  . Diabetes mellitus   . Hypercholesteremia   . Hypertension   . MRSA (methicillin resistant staph aureus) culture positive   . Obesity   . Obesity   . Pancreatitis   . Umbilical hernia     There are no active problems to display for this patient.   Past Surgical History:  Procedure Laterality Date  . TESTICLE REMOVAL  right testicle       Home Medications    Prior to Admission medications   Medication Sig Start Date End Date Taking? Authorizing Provider  insulin NPH-insulin regular (NOVOLIN 70/30) (70-30) 100 UNIT/ML injection Inject 50 Units into the skin 2 (two) times  daily with a meal.    Yes Historical Provider, MD  cephALEXin (KEFLEX) 500 MG capsule Take 1 capsule (500 mg total) by mouth 4 (four) times daily. Patient not taking: Reported on 10/12/2015 01/17/15   Trixie DredgeEmily West, PA-C  HYDROcodone-acetaminophen [facility](NORCO) 5-325 MG per tablet Take 1-2 tablets by mouth every 6 (six) hours as needed for moderate pain or severe pain. Patient not taking: Reported on 10/12/2015 05/23/14   Antony MaduraKelly Humes, PA-C  ibuprofen (ADVIL,MOTRIN) 800 MG tablet Take 1 tablet (800 mg total) by mouth 3 (three) times daily with meals. Patient not taking: Reported on 05/23/2014 11/18/13   Mellody DrownLauren Parker, PA-C  meloxicam (MOBIC) 7.5 MG tablet Take 2 tablets (15 mg total) by mouth daily. Patient not taking: Reported on 10/12/2015 05/23/14   Antony MaduraKelly Humes, PA-C    Family History No family history on file.  Social History Social History  Substance Use Topics  . Smoking status: Current Some Day Smoker  . Smokeless tobacco: Not on file  . Alcohol use No     Allergies   Review of patient's allergies indicates no known allergies.   Review of Systems Review of Systems  10/14 Systems reviewed and are negative for acute change except as noted in the HPI.  Physical Exam Updated Vital Signs BP 149/86   Pulse 89   Temp 98.4 F (36.9 C) (Oral)   Resp 18   Ht 5\' 8"  (1.727 m)   Wt 254 lb (115.2 kg)  SpO2 (!) 84%   BMI 38.62 kg/m   Physical Exam Physical Exam  Nursing note and vitals reviewed. Constitutional: Well developed, well nourished, non-toxic, and in no acute distress Head: Normocephalic and atraumatic.  Mouth/Throat: Oropharynx is clear and moist.  Neck: Normal range of motion. Neck supple.  Cardiovascular: Normal rate and regular rhythm.   Pulmonary/Chest: Effort normal and breath sounds normal.  Abdominal: Soft. Obese, non distended. Umbilical hernia with 2.5 cm defect. Umbilical hernia reduced with pressure but some persistent periumbilical abdominal pain afterwards.There  is no rebound and no guarding.  Musculoskeletal: Normal range of motion.  Neurological: Alert, no facial droop, fluent speech, moves all extremities symmetrically Skin: Skin is warm and dry.  Psychiatric: Cooperative   ED Treatments / Results  DIAGNOSTIC STUDIES: Oxygen Saturation is 99% on RA, normal by my interpretation.  COORDINATION OF CARE:  1:48 AM Will order CT of abdomen. Discussed treatment plan with pt at bedside and pt agreed to plan.  Labs (all labs ordered are listed, but only abnormal results are displayed) Labs Reviewed  COMPREHENSIVE METABOLIC PANEL - Abnormal; Notable for the following:       Result Value   Glucose, Bld 346 (*)    Calcium 8.4 (*)    Total Protein 6.3 (*)    Albumin 3.3 (*)    ALT 16 (*)    All other components within normal limits  CBC - Abnormal; Notable for the following:    Platelets 88 (*)    All other components within normal limits  URINALYSIS, ROUTINE W REFLEX MICROSCOPIC (NOT AT [facility]) - Abnormal; Notable for the following:    Specific Gravity, Urine 1.037 (*)    Glucose, UA >1000 (*)    All other components within normal limits  URINE MICROSCOPIC-ADD ON - Abnormal; Notable for the following:    Squamous Epithelial / LPF 0-5 (*)    All other components within normal limits  LIPASE, BLOOD    EKG  EKG Interpretation None       Radiology Ct Abdomen Pelvis W Contrast  Result Date: 10/12/2015 CLINICAL DATA:  Initial evaluation for acute abdominal pain related to umbilical hernia. EXAM: CT ABDOMEN AND PELVIS WITH CONTRAST TECHNIQUE: Multidetector CT imaging of the abdomen and pelvis was performed using the standard protocol following bolus administration of intravenous contrast. CONTRAST:  ISOVUE-300 IOPAMIDOL (ISOVUE-300) INJECTION 61% COMPARISON:  Prior CT from 08/20/2010. FINDINGS: Mild subsegmental atelectasis seen dependently within the visualized lung bases. Visualized lungs are otherwise clear. The liver demonstrates a  normal contrast enhanced appearance. Gallbladder within normal limits. No biliary dilatation. Spleen, adrenal glands, and pancreas demonstrate a normal contrast enhanced appearance. Kidneys are equal in size with symmetric enhancement. No nephrolithiasis, hydronephrosis, or focal enhancing renal mass. Stomach within normal limits. No evidence for bowel obstruction. No abnormal wall thickening, mucosal enhancement, or inflammatory fat stranding seen about the bowels. Appendix within normal limits. Bladder partially distended without acute abnormality. Prostate normal. No free air. Small volume free fluid within the pelvis, suspected to be reactive. Prominent fact containing ventral hernia present just to the left of the umbilicus. Neck of the hernia measures 2.8 cm. Hernia contains fat only. Hernia itself demonstrates a fairly thick rim. There is surrounding inflammatory stranding within the adjacent subcutaneous fat. Mild inflammatory stranding at the base of the hernia within the anterior abdomen as well. No pathologically enlarged lymph nodes identified within the abdomen and pelvis. Mild air to bi-iliac atherosclerotic disease. Normal intravascular enhancement seen throughout the intra-abdominal  aorta and its branch vessels. No acute osseous abnormality. No worrisome lytic or blastic osseous lesions. Moderate multilevel degenerative spondylolysis noted within the visualized spine. Fixation hardware noted within the proximal femurs bilaterally. IMPRESSION: 1. Moderate size fat containing ventral hernia just to the left of the umbilicus with associated inflammatory changes as above. 2. Small volume free fluid within the pelvis, suspected to be reactive in nature. 3. No other acute intra-abdominal or pelvic process identified. Electronically Signed   By: Rise Mu [name]   On: 10/12/2015 03:47    Procedures Procedures (including critical care time)  Medications Ordered in ED Medications  morphine 4  MG/ML injection 4 mg (4 mg Intravenous Given 10/12/15 0220)  ondansetron (ZOFRAN) injection 4 mg (4 mg Intravenous Given 10/12/15 0220)  sodium chloride 0.9 % bolus 1,000 mL (1,000 mLs Intravenous New Bag/Given 10/12/15 0223)  iopamidol (ISOVUE-300) 61 % injection (100 mLs  Contrast Given 10/12/15 0306)     Initial Impression / Assessment and Plan / ED Course  I have reviewed the triage vital signs and the nursing notes.  Pertinent labs & imaging results that were available during my care of the patient were reviewed by me and considered in my medical decision making (see chart for details).  Clinical Course    40 year old male with history of known umbilical hernia who presents with periumbilical pain. Nontoxic in no acute distress with non-concerning vital signs. Soft and nonsurgical abdomen. Umbilical hernia is able to be reduced with manual pressure at bedside through 2.5 cm defect. He continues to have some generalized abdominal pain. Basic blood work is unremarkable. We did perform a CT abdomen pelvis, showing fat containing umbilical hernia which is likely causing him some mild residual pain. No other acute intra-abdominal processes. At this time he is felt stable for discharge home. He is given general surgery follow-up to discuss need for potential elective hernia repair. Strict return and follow-up instructions reviewed. He expressed understanding of all discharge instructions and felt comfortable with the plan of care.  Final Clinical Impressions(s) / ED Diagnoses   Final diagnoses:  Umbilical hernia without obstruction and without gangrene    New Prescriptions New Prescriptions   No medications on file   I personally performed the services described in this documentation, which was scribed in my presence. The recorded information has been reviewed and is accurate.    Jason Guise, MD 10/12/15 [phone]

## 2015-10-12 NOTE — Discharge Instructions (Signed)
Return without fail for worsening symptoms, including escalating pain, vomiting, unable to push/reduce your hernia or any other symptoms concerning to you.

## 2015-12-24 ENCOUNTER — Ambulatory Visit (HOSPITAL_COMMUNITY)
Admission: EM | Admit: 2015-12-24 | Discharge: 2015-12-24 | Disposition: A | Discharge status: Home / Self Care | Payer: Medicaid Other | Attending: Family Medicine | Admitting: Family Medicine

## 2015-12-24 ENCOUNTER — Ambulatory Visit (INDEPENDENT_AMBULATORY_CARE_PROVIDER_SITE_OTHER): Payer: Self-pay

## 2015-12-24 ENCOUNTER — Encounter (HOSPITAL_COMMUNITY): Payer: Self-pay | Admitting: *Deleted

## 2015-12-24 DIAGNOSIS — S62639A Displaced fracture of distal phalanx of unspecified finger, initial encounter for closed fracture: Secondary | ICD-10-CM

## 2015-12-24 DIAGNOSIS — S6710XA Crushing injury of unspecified finger(s), initial encounter: Secondary | ICD-10-CM

## 2015-12-24 NOTE — ED Provider Notes (Signed)
[facility]    CSN: [id number] Arrival date & time: 12/24/15  1759     History   Chief Complaint Chief Complaint  Patient presents with  . Finger Injury    HPI [name] is a 40 y.o. male.   The history is provided by the patient.  Hand Pain  This is a new problem. The current episode started yesterday (dropped brake rotor on lrf last eve, still with throbbing pain.). The problem has been gradually worsening. The symptoms are aggravated by bending.    Past Medical History:  Diagnosis Date  . Diabetes mellitus   . Hypercholesteremia   . Hypertension   . MRSA (methicillin resistant staph aureus) culture positive   . Obesity   . Obesity   . Pancreatitis   . Umbilical hernia     There are no active problems to display for this patient.   Past Surgical History:  Procedure Laterality Date  . TESTICLE REMOVAL  right testicle       Home Medications    Prior to Admission medications   Medication Sig Start Date End Date Taking? Authorizing Provider  cephALEXin (KEFLEX) 500 MG capsule Take 1 capsule (500 mg total) by mouth 4 (four) times daily. Patient not taking: Reported on 10/12/2015 01/17/15   Trixie Dredge, PA-C  HYDROcodone-acetaminophen [facility]) 5-325 MG per tablet Take 1-2 tablets by mouth every 6 (six) hours as needed for moderate pain or severe pain. Patient not taking: Reported on 10/12/2015 05/23/14   Antony Madura, PA-C  ibuprofen (ADVIL,MOTRIN) 800 MG tablet Take 1 tablet (800 mg total) by mouth 3 (three) times daily with meals. Patient not taking: Reported on 05/23/2014 11/18/13   Mellody Drown, PA-C  insulin NPH-insulin regular (NOVOLIN 70/30) (70-30) 100 UNIT/ML injection Inject 50 Units into the skin 2 (two) times daily with a meal.     Historical Provider, MD  meloxicam (MOBIC) 7.5 MG tablet Take 2 tablets (15 mg total) by mouth daily. Patient not taking: Reported on 10/12/2015 05/23/14   Antony Madura, PA-C    Family History History reviewed. No  pertinent family history.  Social History Social History  Substance Use Topics  . Smoking status: Current Some Day Smoker  . Smokeless tobacco: Not on file  . Alcohol use No     Allergies   Review of patient's allergies indicates no known allergies.   Review of Systems Review of Systems  Constitutional: Negative.   Musculoskeletal: Negative.   Skin: Positive for wound.  All other systems reviewed and are negative.    Physical Exam Triage Vital Signs ED Triage Vitals  Enc Vitals Group     BP 12/24/15 1956 150/100     Pulse Rate 12/24/15 1956 78     Resp 12/24/15 1956 16     Temp 12/24/15 1956 98.6 F (37 C)     Temp Source 12/24/15 1956 Oral     SpO2 12/24/15 1956 100 %     Weight --      Height --      Head Circumference --      Peak Flow --      Pain Score 12/24/15 1957 7     Pain Loc --      Pain Edu? --      Excl. in GC? --    No data found.   Updated Vital Signs BP 150/100 (BP Location: Right Arm)   Pulse 78   Temp 98.6 F (37 C) (Oral)   Resp 16  SpO2 100%   Visual Acuity Right Eye Distance:   Left Eye Distance:   Bilateral Distance:    Right Eye Near:   Left Eye Near:    Bilateral Near:     Physical Exam  Constitutional: He is oriented to person, place, and time. He appears well-developed and well-nourished.  Musculoskeletal: He exhibits tenderness and deformity.  Subungual blood on lrf, volar surface intact.  Neurological: He is alert and oriented to person, place, and time.  Skin: Skin is warm and dry.  Nursing note and vitals reviewed.    UC Treatments / Results  Labs (all labs ordered are listed, but only abnormal results are displayed) Labs Reviewed - No data to display  EKG  EKG Interpretation None       Radiology No results found. X-rays reviewed and report per radiologist.  Procedures Procedures (including critical care time)  Medications Ordered in UC Medications - No data to display   Initial Impression  / Assessment and Plan / UC Course  I have reviewed the triage vital signs and the nursing notes.  Pertinent labs & imaging results that were available during my care of the patient were reviewed by me and considered in my medical decision making (see chart for details).  Clinical Course      Final Clinical Impressions(s) / UC Diagnoses   Final diagnoses:  None    New Prescriptions New Prescriptions   No medications on file     Linna HoffJames D Kindl, MD 12/24/15 2105

## 2015-12-24 NOTE — Discharge Instructions (Signed)
Soak in warm peroxide twice a day for 2 days, wear splint for protection, advil for pain , see orthopedist if any problems.

## 2015-12-24 NOTE — ED Triage Notes (Signed)
Pt  Dropped  A  Rotor  On the   l    Ring  Finger      He  Has  Pain  And  Blood  Under  The  Nailbed    He  Reports  Pain  Present    denys  Any  Other injury

## 2016-07-30 DIAGNOSIS — E1142 Type 2 diabetes mellitus with diabetic polyneuropathy: Secondary | ICD-10-CM | POA: Insufficient documentation

## 2016-07-30 DIAGNOSIS — R5383 Other fatigue: Secondary | ICD-10-CM | POA: Insufficient documentation

## 2016-07-30 DIAGNOSIS — K429 Umbilical hernia without obstruction or gangrene: Secondary | ICD-10-CM | POA: Insufficient documentation

## 2016-07-30 DIAGNOSIS — I10 Essential (primary) hypertension: Secondary | ICD-10-CM | POA: Insufficient documentation

## 2016-07-30 DIAGNOSIS — E786 Lipoprotein deficiency: Secondary | ICD-10-CM | POA: Insufficient documentation

## 2016-07-30 DIAGNOSIS — F4321 Adjustment disorder with depressed mood: Secondary | ICD-10-CM | POA: Insufficient documentation

## 2016-10-02 DIAGNOSIS — Z720 Tobacco use: Secondary | ICD-10-CM | POA: Insufficient documentation

## 2016-10-02 DIAGNOSIS — Z87891 Personal history of nicotine dependence: Secondary | ICD-10-CM | POA: Insufficient documentation

## 2016-10-28 ENCOUNTER — Encounter (HOSPITAL_COMMUNITY): Payer: Self-pay | Admitting: *Deleted

## 2016-10-28 ENCOUNTER — Emergency Department (HOSPITAL_COMMUNITY)
Admission: EM | Admit: 2016-10-28 | Discharge: 2016-10-28 | Disposition: A | Discharge status: Home / Self Care | Payer: PRIVATE HEALTH INSURANCE | Attending: Emergency Medicine | Admitting: Emergency Medicine

## 2016-10-28 DIAGNOSIS — I10 Essential (primary) hypertension: Secondary | ICD-10-CM | POA: Insufficient documentation

## 2016-10-28 DIAGNOSIS — F17200 Nicotine dependence, unspecified, uncomplicated: Secondary | ICD-10-CM | POA: Insufficient documentation

## 2016-10-28 DIAGNOSIS — M545 Low back pain, unspecified: Secondary | ICD-10-CM

## 2016-10-28 DIAGNOSIS — Z794 Long term (current) use of insulin: Secondary | ICD-10-CM | POA: Diagnosis not present

## 2016-10-28 DIAGNOSIS — E119 Type 2 diabetes mellitus without complications: Secondary | ICD-10-CM | POA: Insufficient documentation

## 2016-10-28 LAB — COMPREHENSIVE METABOLIC PANEL
ALK PHOS: 73 U/L (ref 38–126)
ALT: 14 U/L — AB (ref 17–63)
AST: 17 U/L (ref 15–41)
Albumin: 3.6 g/dL (ref 3.5–5.0)
Anion gap: 6 (ref 5–15)
BUN: 17 mg/dL (ref 6–20)
CALCIUM: 8.3 mg/dL — AB (ref 8.9–10.3)
CO2: 25 mmol/L (ref 22–32)
CREATININE: 0.72 mg/dL (ref 0.61–1.24)
Chloride: 105 mmol/L (ref 101–111)
GFR calc non Af Amer: >60 mL/min (ref >60)
GLUCOSE: 254 mg/dL — AB (ref 65–99)
Potassium: 4.1 mmol/L (ref 3.5–5.1)
Sodium: 136 mmol/L (ref 135–145)
Total Bilirubin: 0.7 mg/dL (ref 0.3–1.2)
Total Protein: 6.5 g/dL (ref 6.5–8.1)

## 2016-10-28 LAB — URINALYSIS, ROUTINE W REFLEX MICROSCOPIC
Bacteria, UA: NONE SEEN
Bilirubin Urine: NEGATIVE
HGB URINE DIPSTICK: NEGATIVE
Ketones, ur: 5 mg/dL — AB
Leukocytes, UA: NEGATIVE
Nitrite: NEGATIVE
PH: 5 (ref 5.0–8.0)
Protein, ur: NEGATIVE mg/dL
SPECIFIC GRAVITY, URINE: 1.027 (ref 1.005–1.030)

## 2016-10-28 LAB — CBC
HCT: 45.4 % (ref 39.0–52.0)
Hemoglobin: 15.2 g/dL (ref 13.0–17.0)
MCH: 31 pg (ref 26.0–34.0)
MCHC: 33.5 g/dL (ref 30.0–36.0)
MCV: 92.7 fL (ref 78.0–100.0)
PLATELETS: 86 10*3/uL — AB (ref 150–400)
RBC: 4.9 MIL/uL (ref 4.22–5.81)
RDW: 13.5 % (ref 11.5–15.5)
WBC: 8.2 10*3/uL (ref 4.0–10.5)

## 2016-10-28 LAB — LIPASE, BLOOD: Lipase: 100 U/L — ABNORMAL HIGH (ref 11–51)

## 2016-10-28 MED ORDER — KETOROLAC TROMETHAMINE 30 MG/ML IJ SOLN
60.0000 mg | Freq: Once | INTRAMUSCULAR | Status: AC
  Administered 2016-10-28: 60 mg via INTRAMUSCULAR
  Filled 2016-10-28: qty 2

## 2016-10-28 MED ORDER — HYDROMORPHONE HCL 1 MG/ML IJ SOLN
1.0000 mg | Freq: Once | INTRAMUSCULAR | Status: AC
  Administered 2016-10-28: 1 mg via INTRAMUSCULAR
  Filled 2016-10-28: qty 1

## 2016-10-28 MED ORDER — IBUPROFEN 800 MG PO TABS: 800.0000 mg | ORAL_TABLET | Freq: Three times a day (TID) | ORAL | 0 refills | Status: DC | PRN

## 2016-10-28 MED ORDER — OXYCODONE-ACETAMINOPHEN 5-325 MG PO TABS: 1.0000 | ORAL_TABLET | Freq: Four times a day (QID) | ORAL | 0 refills | Status: DC | PRN

## 2016-10-28 NOTE — ED Provider Notes (Signed)
TIME SEEN: 5:27 AM  CHIEF COMPLAINT: lower back pain  HPI: patient is a 41 year old male with history of hypertension, insulin-dependent diabetes, hyperlipidemia, pancreatitis who presents to the emergency department with complaints of lower back pain that started several days ago. Started while walking with his daughter. He states he felt something tight enough in his lower back worse on the right side. He denies any numbness, tingling or focal weakness. No bowel or bladder incontinence. No urinary retention. No fevers. No history of IV drug abuse, back surgery, back injections or cancer. He denies any fevers, chills, nausea, vomiting, diarrhea, dysuria, hematuria, testicular plain or swelling, penile discharge.  Back pain is described as severe, sharp and achy and worse with movement. Better with staying still. No history of kidney stones.  He denies any abdominal pain at this time.  ROS: See HPI Constitutional: no fever  Eyes: no drainage  ENT: no runny nose   Cardiovascular:  no chest pain  Resp: no SOB  GI: no vomiting GU: no dysuria Integumentary: no rash  Allergy: no hives  Musculoskeletal: no leg swelling  Neurological: no slurred speech ROS otherwise negative  PAST MEDICAL HISTORY/PAST SURGICAL HISTORY:  Past Medical History:  Diagnosis Date  . Diabetes mellitus   . Hypercholesteremia   . Hypertension   . MRSA (methicillin resistant staph aureus) culture positive   . Obesity   . Obesity   . Pancreatitis   . Umbilical hernia     MEDICATIONS:  Prior to Admission medications   Medication Sig Start Date End Date Taking? Authorizing Provider  cephALEXin (KEFLEX) 500 MG capsule Take 1 capsule (500 mg total) by mouth 4 (four) times daily. Patient not taking: Reported on 10/12/2015 01/17/15   Trixie Dredge, PA-C  HYDROcodone-acetaminophen [facility]) 5-325 MG per tablet Take 1-2 tablets by mouth every 6 (six) hours as needed for moderate pain or severe pain. Patient not taking:  Reported on 10/12/2015 05/23/14   Antony Madura, PA-C  ibuprofen (ADVIL,MOTRIN) 800 MG tablet Take 1 tablet (800 mg total) by mouth 3 (three) times daily with meals. Patient not taking: Reported on 05/23/2014 11/18/13   Mellody Drown, PA-C  insulin NPH-insulin regular (NOVOLIN 70/30) (70-30) 100 UNIT/ML injection Inject 50 Units into the skin 2 (two) times daily with a meal.     [provider]  meloxicam (MOBIC) 7.5 MG tablet Take 2 tablets (15 mg total) by mouth daily. Patient not taking: Reported on 10/12/2015 05/23/14   Antony Madura, PA-C    ALLERGIES:  No Known Allergies  SOCIAL HISTORY:  Social History  Substance Use Topics  . Smoking status: Current Some Day Smoker  . Smokeless tobacco: Never Used  . Alcohol use No    FAMILY HISTORY: No family history on file.  EXAM: BP (!) 182/109 (BP Location: Left Arm)   Pulse 92   Temp 98.2 F (36.8 C) (Oral)   Resp 20   Wt 115.2 kg (254 lb)   SpO2 100%   BMI 38.62 kg/m  CONSTITUTIONAL: Alert and oriented and responds appropriately to questions. Well-appearing; well-nourished HEAD: Normocephalic EYES: Conjunctivae clear, pupils appear equal, EOMI ENT: normal nose; moist mucous membranes NECK: Supple, no meningismus, no nuchal rigidity, no LAD  CARD: RRR; S1 and S2 appreciated; no murmurs, no clicks, no rubs, no gallops RESP: Normal chest excursion without splinting or tachypnea; breath sounds clear and equal bilaterally; no wheezes, no rhonchi, no rales, no hypoxia or respiratory distress, speaking full sentences ABD/GI: Normal bowel sounds; non-distended; soft, non-tender, no  rebound, no guarding, no peritoneal signs, no hepatosplenomegaly BACK:  The back appears normal and is tender diffusely over the paraspinal bilateral lumbar musculature worse on the right side, there is no midline spinal tenderness or step-off or deformity, there is no CVA tenderness EXT: Normal ROM in all joints; non-tender to palpation; no edema; normal  capillary refill; no cyanosis, no calf tenderness or swelling    SKIN: Normal color for age and race; warm; no rash NEURO: Moves all extremities equally, sensation to light touch intact diffusely without saddle anesthesia, strength 5/5 in all 4 extremities, 2+ deep reflexes in bilateral upper and lower extremity, no clonus, he is able to relate that appears to be in pain with walking and has a hard time sitting upright in the bed secondary to lower back pain PSYCH: The patient's mood and manner are appropriate. Grooming and personal hygiene are appropriate.  MEDICAL DECISION MAKING: Patient here with lower back pain. Appears to be musculoskeletal in nature and is worse with movement and reproducible with palpation. He has no midline step-off or deformity. No focal neurologic deficits. Doubt fracture, cauda equina or spinal stenosis, discitis or osteomyelitis, transverse myelitis, epidural abscess or hematoma. I do not feel he needs emergent imaging of his back. He does have blood in his urine but I feel this is an incidental finding. He has no urinary symptoms and no sign of infection in this does not seem to fit a picture of a kidney stone. I do not feel he needs CT imaging at this time and he is comfortable with this plan. Labs also obtained in triage show a thrombocytopenia which appears to be chronic for patient. He also has an elevated lipase has no abdominal pain and no vomiting. I doubt that this is an acute pancreatitis. Plan is to treat patient with Dilaudid, Toradol for pain control and reassess.  I suspect this is lumbar strain, spasm.  ED PROGRESS: Patient reports his pain is now 3/10. His blood pressure has also improved. Again suspect that this is musculoskeletal pain. We'll discharge with ibuprofen, Percocet for pain control. Will hold off on steroids at this time given there is no radicular component and he is an insulin-dependent diabetic. He states he has had back pain before and plans to  see an outpatient specialist. He reports he does have a primary care provider. We discussed at length return precautions. He is comfortable with this plan.   At this time, I do not feel there is any life-threatening condition present. I have reviewed and discussed all results (EKG, imaging, lab, urine as appropriate) and exam findings with patient/family. I have reviewed nursing notes and appropriate previous records.  I feel the patient is safe to be discharged home without further emergent workup and can continue workup as an outpatient as needed. Discussed usual and customary return precautions. Patient/family verbalize understanding and are comfortable with this plan.  Outpatient follow-up has been provided if needed. All questions have been answered.      Ward, Layla Maw, DO 10/28/16 [phone]

## 2016-10-28 NOTE — Discharge Instructions (Signed)
To find a primary care or specialty doctor please call [phone] or [phone] to access "[facility] Find a Doctor Service." ° °You may also go on the  website at [email or web address] ° °There are also multiple [facility] and [facility] practices throughout the [place] that are frequently accepting new patients. You may find a clinic that is close to your home and contact them. ° ° and Wellness -  °[address] °[place] [address] °[phone] ° ° °[facility] -  °[address] ° [place] [address] °[phone] ° ° °[facility] - °[address]  °[place]  [address] °[phone] ° ° °

## 2016-10-28 NOTE — ED Notes (Signed)
ED Provider at bedside. 

## 2016-10-28 NOTE — ED Triage Notes (Signed)
The pt is c/o lt flank pain for 3 hours  The pain is increasing no n or v  No urinary symptoms

## 2016-10-31 DIAGNOSIS — M5137 Other intervertebral disc degeneration, lumbosacral region: Secondary | ICD-10-CM | POA: Insufficient documentation

## 2016-11-19 ENCOUNTER — Ambulatory Visit (INDEPENDENT_AMBULATORY_CARE_PROVIDER_SITE_OTHER): Payer: PRIVATE HEALTH INSURANCE | Admitting: Physical Medicine and Rehabilitation

## 2016-11-19 ENCOUNTER — Ambulatory Visit (INDEPENDENT_AMBULATORY_CARE_PROVIDER_SITE_OTHER): Payer: PRIVATE HEALTH INSURANCE

## 2016-11-19 ENCOUNTER — Encounter (INDEPENDENT_AMBULATORY_CARE_PROVIDER_SITE_OTHER): Payer: Self-pay | Admitting: Physical Medicine and Rehabilitation

## 2016-11-19 VITALS — BP 157/93 | HR 86

## 2016-11-19 DIAGNOSIS — M25551 Pain in right hip: Secondary | ICD-10-CM

## 2016-11-19 DIAGNOSIS — M47816 Spondylosis without myelopathy or radiculopathy, lumbar region: Secondary | ICD-10-CM

## 2016-11-19 DIAGNOSIS — M545 Low back pain, unspecified: Secondary | ICD-10-CM

## 2016-11-19 DIAGNOSIS — G8929 Other chronic pain: Secondary | ICD-10-CM

## 2016-11-19 DIAGNOSIS — M4125 Other idiopathic scoliosis, thoracolumbar region: Secondary | ICD-10-CM | POA: Diagnosis not present

## 2016-11-19 MED ORDER — MELOXICAM 15 MG PO TABS: 15.0000 mg | ORAL_TABLET | Freq: Every day | ORAL | 0 refills | Status: DC

## 2016-11-19 NOTE — Progress Notes (Deleted)
Lower back pain worse over the last 3 weeks. Had difficulty lifting legs. Constant pain right lower back. Feels occasional "spark" feeling in left lower back. Pain sometimes radiates to right groin. Some numbness in left toes.

## 2016-12-01 ENCOUNTER — Encounter (INDEPENDENT_AMBULATORY_CARE_PROVIDER_SITE_OTHER): Payer: Self-pay | Admitting: Physical Medicine and Rehabilitation

## 2016-12-01 NOTE — Progress Notes (Signed)
[name] - 41 y.o. male MRN [id number]  Date of birth: 04/20/1975  Office Visit Note: Visit Date: 11/19/2016 PCP: Jarome Matin, MD Referred by: Jarome Matin, MD  Subjective: Chief Complaint  Patient presents with  . Lower Back - Pain   HPI: [name] is a very pleasant 41 year old gentleman that comes in today at the request of Dr. Jarold Motto for evaluation and management of his chronic worsening back and hip pain. He reports that he has had many years of back and hip pain. As a young boy he had issues with his hips and ultimately had a procedure at surgery performed with a did can his hips bilaterally. He is unsure really what the reason for this was. 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 He reports to me he is just dealt with that for many years. He has had pretty chronic off and on back pain really severe at times with periods where he'll just moved a certain way and just cannot really walk or move for a while with pretty significant spasm. He reports worsening over the last 3 weeks with no specific injury. He had difficulty lifting his legs due to the pain. He was having constant right sided lower back pain with a feeling of what he called a "spark" feeling in the left lower back. He does get some pain that radiates to the right groin at times. He feels like there is some numbness in the left toes and time. 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 He is an insulin-dependent diabetic. He has been given a diagnosis of the past for neuropathy. He does take gabapentin. He is not taking any recent anti-inflammatories. He's had no prior back surgery. He did have an MRI of the lumbar spine 2009 this is reviewed below which showed pretty significant disc herniations at L4-5 and L5-S1 with degenerative changes throughout. He's had recent x-rays over the last couple years showing left convex  scoliosis and curvature really related to the pelvis and likely related to the hips in general. He has not had specific physical therapy. He reports that he just recently had good insurance and over the years she has not really been able to see doctors that much.    Review of Systems  Constitutional: Negative for chills, fever, malaise/fatigue and weight loss.  HENT: Negative for hearing loss and sinus pain.   Eyes: Negative for blurred vision, double vision and photophobia.  Respiratory: Negative for cough and shortness of breath.   Cardiovascular: Negative for chest pain, palpitations and leg swelling.  Gastrointestinal: Negative for abdominal pain, nausea and vomiting.  Genitourinary: Negative for flank pain.  Musculoskeletal: Positive for back pain and joint pain. Negative for myalgias.  Skin: Negative for itching and rash.  Neurological: Negative for tremors, focal weakness and weakness.  Endo/Heme/Allergies: Negative.   Psychiatric/Behavioral: Negative for depression.  All other systems reviewed and are negative.  Otherwise per HPI.  Assessment & Plan: Visit Diagnoses:  1. Pain in right hip   2. Chronic right-sided low back pain without sciatica   3. Spondylosis without myelopathy or radiculopathy, lumbar region   4. Other idiopathic scoliosis, thoracolumbar region     Plan: Findings:  Chronic worsening over the last several weeks low back pain more severe on the right with some sharp feeling at times on the left. Maybe occasional groin pain on the right. History of both hips  being can do when he was a teenager. His courses,. By insulin-dependent diabetes. Prior MRI in 2009 show pretty extensive disc herniation and degenerative changes throughout. He does have scoliosis as well. At this point wouldn't treat this is more of a spondylosis problem which is most related to his symptoms clinically. Since his only been over the last few weeks I think a good course of meloxicam daily. We'll  start that at 50 mg once a day for the next 3-4 weeks. Would not use that long-term and all with his diabetes. He doesn't have any history of heart disease particularly. We have talked about activity modification at length today and core strengthening. He would likely benefit from physical therapy short course. He does not necessarily want to do that at this point. I don't think he really needs an injection at this point although if we are point to do one we may do well in the future would likely be facet joint like diagnostically. Alternatively I would look at a diagnostic hip injection. He is likely going to have some issues with his hips given the prior surgery and history. He is very stiff with hip rotation. Doesn't really reproduce all of his pain. Lastly I cannot rule out disc problems such as annular tear disc herniation on his eye having any radicular complaints at this point. His symptoms are worse with standing and moving more than sitting. I think this is still more arthritic type pain even at 41. I think a lot of that may be related to the hip problem in the past and just degenerative change. We will see him back in the next few weeks as needed depending on how he does with activity modification and the anti-inflammatory. Lastly he may need referral to one of our orthopedic surgeons for hip evaluation in the future.    Meds & Orders:  Meds ordered this encounter  Medications  . meloxicam (MOBIC) 15 MG tablet    Sig: Take 1 tablet (15 mg total) by mouth daily.    Dispense:  30 tablet    Refill:  0    Orders Placed This Encounter  Procedures  . XR HIP UNILAT W OR W/O PELVIS 1V RIGHT    Follow-up: Return if symptoms worsen or fail to improve.   Procedures: No procedures performed  No notes on file   Clinical History: LUMBAR SPINE - COMPLETE 4+ VIEW  COMPARISON:  None.  FINDINGS: Degenerative lumbar spondylosis with multilevel disc disease and facet disease. Left convex  scoliosis. No acute fracture. The visualized bony pelvis is intact. Bridging osteophytes noted in the lower thoracic spine. Aortic calcifications without definite aneurysm.  IMPRESSION: Scoliosis and degenerative lumbar spondylosis but no acute bony findings.   Electronically Signed   By: Rudie Meyer [name]   On: 05/23/2014 23:04  MRI LUMBAR SPINE WITHOUT CONTRAST  Technique: Multiplanar and multiecho pulse sequences of the lumbar spine were obtained without intravenous contrast.  Comparison: Plain films 06/17/2007  Findings: The sagittal MR images demonstrate normal overall alignment of the lumbar vertebral bodies. They demonstrate normal marrow signal. There is disc desiccation at L2-L3 and at L5-S1. There are moderate degenerative changes for the patient's age. The last full intervertebral the space is labeled L5-S1. This correlates with the plain films. The conus medullaris terminates at L1.  L1-L2: No significant findings  L2-L3: Broad-based shallow disc protrusion with flattening of the anterior thecal sac. There is also a shallow right foraminal disc protrusion with right foraminal encroachment.  L3-L4: Moderate sized central and right paracentral disc protrusion with mass effect on the anterior thecal sac. Minimal medial foraminal encroachment also  L4-L5: Moderate to large central disc protrusion with significant mass effect on the anterior thecal sac. There is also right lateral recess stenosis and biforaminal stenosis, right greater than left.  L5-S1: Moderate to large central and right paracentral disc protrusion. There is mass effect on the anterior thecal sac. There is also a moderate sized broad-based left foraminal disc protrusion which contacts and compresses the left L5 nerve root. Mild right foraminal stenosis.  IMPRESSION:  1. Multilevel disc disease with specific findings as discussed above. There are significant disc protrusions at L3-L4,  L4-L5 and L5-S1.  He reports that he has been smoking.  He has never used smokeless tobacco. No results for input(s): HGBA1C, LABURIC in the last 8760 hours.  Objective:  VS:  HT:    WT:   BMI:     BP:(!) 157/93  HR:86bpm  TEMP: ( )  RESP:  Physical Exam  Constitutional: He is oriented to person, place, and time. He appears well-developed and well-nourished. No distress.  HENT:  Head: Normocephalic and atraumatic.  Nose: Nose normal.  Mouth/Throat: Oropharynx is clear and moist.  Eyes: Pupils are equal, round, and reactive to light. Conjunctivae are normal.  Neck: Normal range of motion. Neck supple.  Cardiovascular: Regular rhythm and intact distal pulses.   Pulmonary/Chest: Effort normal and breath sounds normal.  Abdominal: Soft. He exhibits no distension.  Musculoskeletal: He exhibits no deformity.  Patient is slow to rise from a seated position. He does have pain with extension rotation of the lumbar spine to the right and left. He has no pain over the greater trochanters. He is very stiff with hip rotation bilaterally with some groin pain and range. He has good distal strength without clonus. He has a negative slump test bilaterally. He has no real focal trigger points but he is tight throughout the quadratus lumborum bilaterally.  Neurological: He is alert and oriented to person, place, and time. He exhibits normal muscle tone. Coordination normal.  Skin: Skin is warm. No rash noted.  Psychiatric: He has a normal mood and affect. His behavior is normal.  Nursing note and vitals reviewed.   Ortho Exam Imaging: No results found.  Past Medical/Family/Surgical/Social History: Medications & Allergies reviewed per EMR There are no active problems to display for this patient.  Past Medical History:  Diagnosis Date  . Diabetes mellitus   . Hypercholesteremia   . Hypertension   . MRSA (methicillin resistant staph aureus) culture positive   . Obesity   . Obesity   .  Pancreatitis   . Umbilical hernia    History reviewed. No pertinent family history. Past Surgical History:  Procedure Laterality Date  . TESTICLE REMOVAL  right testicle   Social History   Occupational History  . Not on file.   Social History Main Topics  . Smoking status: Current Some Day Smoker  . Smokeless tobacco: Never Used  . Alcohol use No  . Drug use: No  . Sexual activity: Not on file

## 2016-12-13 ENCOUNTER — Telehealth (INDEPENDENT_AMBULATORY_CARE_PROVIDER_SITE_OTHER): Payer: Self-pay | Admitting: Radiology

## 2016-12-13 DIAGNOSIS — N529 Male erectile dysfunction, unspecified: Secondary | ICD-10-CM | POA: Insufficient documentation

## 2016-12-13 NOTE — Telephone Encounter (Signed)
Patient lmom states that he was suppose to call back and let you know how his hip injection did, He says that it has done good for him.--He is doing good.

## 2016-12-29 ENCOUNTER — Encounter (HOSPITAL_COMMUNITY): Payer: Self-pay | Admitting: *Deleted

## 2016-12-29 ENCOUNTER — Ambulatory Visit (HOSPITAL_COMMUNITY)
Admission: EM | Admit: 2016-12-29 | Discharge: 2016-12-29 | Disposition: A | Discharge status: Home / Self Care | Payer: PRIVATE HEALTH INSURANCE

## 2016-12-29 DIAGNOSIS — S161XXA Strain of muscle, fascia and tendon at neck level, initial encounter: Secondary | ICD-10-CM

## 2016-12-29 MED ORDER — TRAMADOL HCL 50 MG PO TABS: 25.0000 mg | ORAL_TABLET | Freq: Four times a day (QID) | ORAL | 0 refills | Status: DC | PRN

## 2016-12-29 MED ORDER — CYCLOBENZAPRINE HCL 10 MG PO TABS: 5.0000 mg | ORAL_TABLET | Freq: Three times a day (TID) | ORAL | 0 refills | Status: DC | PRN

## 2016-12-29 NOTE — Discharge Instructions (Signed)
Use a warm compress to the affected area often.  There is nothing wrong, that I can see, with your ear.

## 2016-12-29 NOTE — ED Provider Notes (Signed)
12/29/2016 3:29 PM   DOB: 05/09/1975 / MRN: [id number]  SUBJECTIVE:  [name] is a 41 y.o. male presenting for improving ear pressure that started a few days ago.  No meds yet.   He has No Known Allergies.   He  has a past medical history of Diabetes mellitus; Hypercholesteremia; Hypertension; MRSA (methicillin resistant staph aureus) culture positive; Obesity; Obesity; Pancreatitis; and Umbilical hernia.    He  reports that he has been smoking.  He has never used smokeless tobacco. He reports that he does not drink alcohol or use drugs. He  has no sexual activity history on file. The patient  has a past surgical history that includes Testicle removal (right testicle).  His family history is not on file.  Review of Systems  Constitutional: Negative for fever.  HENT: Negative for congestion, ear pain, hearing loss, sore throat and tinnitus.   Eyes: Negative.   Skin: Negative for rash.  Neurological: Negative for dizziness.    OBJECTIVE:  BP 120/69   Pulse 82   Temp 98.7 F (37.1 C) (Oral)   Resp 16   SpO2 99%   Physical Exam  Constitutional: He appears well-developed. He is active and cooperative.  Non-toxic appearance.  HENT:  Head:    Right Ear: Hearing, tympanic membrane, external ear and ear canal normal.  Left Ear: Hearing, tympanic membrane, external ear and ear canal normal.  Nose: Nose normal. Right sinus exhibits no maxillary sinus tenderness and no frontal sinus tenderness. Left sinus exhibits no maxillary sinus tenderness and no frontal sinus tenderness.  Mouth/Throat: Uvula is midline, oropharynx is clear and moist and mucous membranes are normal. No oropharyngeal exudate, posterior oropharyngeal edema or tonsillar abscesses.  Eyes: Pupils are equal, round, and reactive to light. Conjunctivae are normal.  Cardiovascular: Normal rate.   Pulmonary/Chest: Effort normal. No tachypnea.  Lymphadenopathy:       Head (right side): No submandibular and no tonsillar  adenopathy present.       Head (left side): No submandibular and no tonsillar adenopathy present.    He has no cervical adenopathy.  Neurological: He is alert.  Skin: Skin is warm and dry. He is not diaphoretic. No pallor.  Vitals reviewed.   No results found for this or any previous visit (from the past 72 hour(s)).  No results found.  ASSESSMENT AND PLAN:  The encounter diagnosis was Strain of neck muscle, initial encounter. Patient already taking meloxicam.  Adding flexeril and tramadol as needed.     The patient is advised to call or return to clinic if he does not see an improvement in symptoms, or to seek the care of the closest emergency department if he worsens with the above plan.   Deliah BostonMichael Edge Mauger, MHS, PA-C 12/29/2016 3:29 PM    Ofilia Neaslark, Santos Sollenberger L, PA-C 12/29/16 1530

## 2016-12-29 NOTE — ED Triage Notes (Signed)
C/O right ear pain x 3 days. 

## 2017-03-13 DIAGNOSIS — B070 Plantar wart: Secondary | ICD-10-CM | POA: Insufficient documentation

## 2017-04-21 ENCOUNTER — Telehealth (INDEPENDENT_AMBULATORY_CARE_PROVIDER_SITE_OTHER): Payer: Self-pay | Admitting: Physical Medicine and Rehabilitation

## 2017-04-21 NOTE — Telephone Encounter (Signed)
Ok for OV and possible hip injection, he was complicated, I remember that. May have done one as last resort and not documented on same day?

## 2017-04-22 NOTE — Telephone Encounter (Signed)
Left message for patient to call back to schedule.  °

## 2017-04-22 NOTE — Telephone Encounter (Signed)
Scheduled for 05/07/17 at 0915.

## 2017-04-24 ENCOUNTER — Ambulatory Visit (INDEPENDENT_AMBULATORY_CARE_PROVIDER_SITE_OTHER): Payer: BLUE CROSS/BLUE SHIELD | Admitting: Physical Medicine and Rehabilitation

## 2017-04-24 ENCOUNTER — Ambulatory Visit (INDEPENDENT_AMBULATORY_CARE_PROVIDER_SITE_OTHER): Payer: Self-pay

## 2017-04-24 ENCOUNTER — Encounter (INDEPENDENT_AMBULATORY_CARE_PROVIDER_SITE_OTHER): Payer: Self-pay | Admitting: Physical Medicine and Rehabilitation

## 2017-04-24 VITALS — BP 155/97 | HR 79

## 2017-04-24 DIAGNOSIS — G8929 Other chronic pain: Secondary | ICD-10-CM

## 2017-04-24 DIAGNOSIS — M25552 Pain in left hip: Secondary | ICD-10-CM | POA: Diagnosis not present

## 2017-04-24 DIAGNOSIS — M545 Low back pain: Secondary | ICD-10-CM

## 2017-04-24 MED ORDER — TRIAMCINOLONE ACETONIDE 40 MG/ML IJ SUSP
80.0000 mg | INTRAMUSCULAR | Status: AC | PRN
  Administered 2017-04-24: 80 mg via INTRA_ARTICULAR

## 2017-04-24 MED ORDER — MELOXICAM 15 MG PO TABS: 15.0000 mg | ORAL_TABLET | Freq: Every day | ORAL | 0 refills | Status: DC

## 2017-04-24 MED ORDER — BUPIVACAINE HCL 0.5 % IJ SOLN
3.0000 mL | INTRAMUSCULAR | Status: AC | PRN
  Administered 2017-04-24: 3 mL via INTRA_ARTICULAR

## 2017-04-24 NOTE — Progress Notes (Signed)
[name] - 42 y.o. male MRN [id number]  Date of birth: 07-21-1975  Office Visit Note: Visit Date: 04/24/2017 PCP: Jarome Matin, MD Referred by: Jarome Matin, MD  Subjective: Chief Complaint  Patient presents with  . Lower Back - Pain  . Left Hip - Pain  . Left Elbow - Pain   HPI: [name] is a 42 year old gentleman that I saw last year for back and hip pain which is right more than left.  He is followed by Dr. Sheppard Penton as his primary care physician.  He reports that last time he was here we completed a hip injection on the right side and he actually got quite a bit of relief with that and is been doing fairly well.  He reports worsening left hip and groin pain however.  He reports recent tightness in the hip and feels like his leg will sort of give out on him.  Is worse with walking and getting in and out of the car.  He does have some lower back pain as well which is more left-sided.  He has no radicular pain down the leg any further than the hip area.  Prior hip fixation surgery which was done as a youth.  Prior MRI findings of the lumbar spine show disc herniations a few years ago.  He has not had lumbar spine surgery.  He does continue to work and try to stay active.  He has had no specific injury.  His case is complicated by some scoliosis as well as bilateral hip problems with fixation as a youth.  He also is an insulin-dependent diabetic.    ROS Otherwise per HPI.  Assessment & Plan: Visit Diagnoses:  1. Left hip pain   2. 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  He did well with prior hip intra-articular injection so we will do the left side today.  Depending on his relief would look at perhaps repeating imaging of his lumbar spine versus injection.  I do want him to take meloxicam daily for the next few weeks  and just see if that also helps calm things down in terms of his back and leg.  He also reports briefly on the way out about his left elbow.  He does have a small nodule proximal to the elbow on the posterior side but with no redness or induration.  He is going to watch this for a while and I would have him see 1 of the orthopedic surgeons in the office for evaluation of his elbow pain if it worsens.    Meds & Orders:  Meds ordered this encounter  Medications  . meloxicam (MOBIC) 15 MG tablet    Sig: Take 1 tablet (15 mg total) by mouth daily.    Dispense:  30 tablet    Refill:  0    Orders Placed This Encounter  Procedures  . Large Joint Inj: L hip joint  . XR C-ARM NO REPORT    Follow-up: No Follow-up on file.   Procedures: Large Joint Inj: L hip joint on 04/24/2017 8:28 AM Indications: diagnostic evaluation and pain Details: 22 G 3.5 in needle, fluoroscopy-guided anterior approach  Arthrogram: No  Medications: 3 mL bupivacaine 0.5 %; 80 mg triamcinolone acetonide 40 MG/ML Outcome: tolerated well, no immediate complications  There was excellent flow of contrast producing a  partial arthrogram of the hip. The patient did have relief of symptoms during the anesthetic phase of the injection. Procedure, treatment alternatives, risks and benefits explained, specific risks discussed. Consent was given by the patient. Immediately prior to procedure a time out was called to verify the correct patient, procedure, equipment, support staff and site/side marked as required. Patient was prepped and draped in the usual sterile fashion.      No notes on file   Clinical History: LUMBAR SPINE - COMPLETE 4+ VIEW  COMPARISON:  None.  FINDINGS: Degenerative lumbar spondylosis with multilevel disc disease and facet disease. Left convex scoliosis. No acute fracture. The visualized bony pelvis is intact. Bridging osteophytes noted in the lower thoracic spine. Aortic calcifications without  definite aneurysm.  IMPRESSION: Scoliosis and degenerative lumbar spondylosis but no acute bony findings.   Electronically Signed   By: Rudie MeyerP.  Gallerani [name]   On: 05/23/2014 23:04  MRI LUMBAR SPINE WITHOUT CONTRAST  Technique: Multiplanar and multiecho pulse sequences of the lumbar spine were obtained without intravenous contrast.  Comparison: Plain films 06/17/2007  Findings: The sagittal MR images demonstrate normal overall alignment of the lumbar vertebral bodies. They demonstrate normal marrow signal. There is disc desiccation at L2-L3 and at L5-S1. There are moderate degenerative changes for the patient's age. The last full intervertebral the space is labeled L5-S1. This correlates with the plain films. The conus medullaris terminates at L1.  L1-L2: No significant findings  L2-L3: Broad-based shallow disc protrusion with flattening of the anterior thecal sac. There is also a shallow right foraminal disc protrusion with right foraminal encroachment.  L3-L4: Moderate sized central and right paracentral disc protrusion with mass effect on the anterior thecal sac. Minimal medial foraminal encroachment also  L4-L5: Moderate to large central disc protrusion with significant mass effect on the anterior thecal sac. There is also right lateral recess stenosis and biforaminal stenosis, right greater than left.  L5-S1: Moderate to large central and right paracentral disc protrusion. There is mass effect on the anterior thecal sac. There is also a moderate sized broad-based left foraminal disc protrusion which contacts and compresses the left L5 nerve root. Mild right foraminal stenosis.  IMPRESSION:  1. Multilevel disc disease with specific findings as discussed above. There are significant disc protrusions at L3-L4, L4-L5 and L5-S1.  He reports that he has been smoking.  he has never used smokeless tobacco. No results for input(s): HGBA1C, LABURIC in the last 8760  hours.  Objective:  VS:  HT:    WT:   BMI:     BP:(!) 155/97  HR:79bpm  TEMP: ( )  RESP:  Physical Exam  Musculoskeletal:  Patient ambulates without aid with a forward flexed spine.  He has pain with extension of the lumbar spine.  He does have pain with rotation of the left hip with groin pain on internal rotation.  He is stiff in both hips.  No pain over the greater trochanters.  He has good distal strength.    Ortho Exam Imaging: Xr C-arm No Report  Result Date: 04/24/2017 Please see Notes or Procedures tab for imaging impression.   Past Medical/Family/Surgical/Social History: Medications & Allergies reviewed per EMR There are no active problems to display for this patient.  Past Medical History:  Diagnosis Date  . Diabetes mellitus   . Hypercholesteremia   . Hypertension   . MRSA (methicillin resistant staph aureus) culture positive   . Obesity   . Obesity   . Pancreatitis   . Umbilical  hernia    History reviewed. No pertinent family history. Past Surgical History:  Procedure Laterality Date  . TESTICLE REMOVAL  right testicle   Social History   Occupational History  . Not on file  Tobacco Use  . Smoking status: Current Some Day Smoker  . Smokeless tobacco: Never Used  Substance and Sexual Activity  . Alcohol use: No  . Drug use: No  . Sexual activity: Not on file

## 2017-04-24 NOTE — Progress Notes (Deleted)
Left sided lower back and left hip pain. Groin pain on left. Tightness in hip. Feels like leg has been "giving out." Pain is worse with walking.

## 2017-04-24 NOTE — Patient Instructions (Signed)

## 2017-05-07 ENCOUNTER — Ambulatory Visit (INDEPENDENT_AMBULATORY_CARE_PROVIDER_SITE_OTHER): Payer: PRIVATE HEALTH INSURANCE | Admitting: Physical Medicine and Rehabilitation

## 2017-06-17 DIAGNOSIS — Z794 Long term (current) use of insulin: Secondary | ICD-10-CM | POA: Insufficient documentation

## 2017-06-22 ENCOUNTER — Encounter (HOSPITAL_COMMUNITY): Payer: Self-pay

## 2017-06-22 ENCOUNTER — Emergency Department (HOSPITAL_COMMUNITY)
Admission: EM | Admit: 2017-06-22 | Discharge: 2017-06-22 | Disposition: A | Discharge status: Home / Self Care | Payer: BLUE CROSS/BLUE SHIELD | Attending: Emergency Medicine | Admitting: Emergency Medicine

## 2017-06-22 DIAGNOSIS — I10 Essential (primary) hypertension: Secondary | ICD-10-CM | POA: Diagnosis not present

## 2017-06-22 DIAGNOSIS — F17210 Nicotine dependence, cigarettes, uncomplicated: Secondary | ICD-10-CM | POA: Diagnosis not present

## 2017-06-22 DIAGNOSIS — Z79899 Other long term (current) drug therapy: Secondary | ICD-10-CM | POA: Diagnosis not present

## 2017-06-22 DIAGNOSIS — T782XXA Anaphylactic shock, unspecified, initial encounter: Secondary | ICD-10-CM | POA: Insufficient documentation

## 2017-06-22 DIAGNOSIS — E119 Type 2 diabetes mellitus without complications: Secondary | ICD-10-CM | POA: Diagnosis not present

## 2017-06-22 LAB — CBC WITH DIFFERENTIAL/PLATELET
Basophils Absolute: 0 10*3/uL (ref 0.0–0.1)
Basophils Relative: 0 %
EOS ABS: 0.2 10*3/uL (ref 0.0–0.7)
Eosinophils Relative: 2 %
HEMATOCRIT: 48.7 % (ref 39.0–52.0)
HEMOGLOBIN: 17 g/dL (ref 13.0–17.0)
LYMPHS ABS: 4.5 10*3/uL — AB (ref 0.7–4.0)
LYMPHS PCT: 47 %
MCH: 31.8 pg (ref 26.0–34.0)
MCHC: 34.9 g/dL (ref 30.0–36.0)
MCV: 91.2 fL (ref 78.0–100.0)
MONOS PCT: 4 %
Monocytes Absolute: 0.4 10*3/uL (ref 0.1–1.0)
NEUTROS ABS: 4.5 10*3/uL (ref 1.7–7.7)
NEUTROS PCT: 47 %
Platelets: 124 10*3/uL — ABNORMAL LOW (ref 150–400)
RBC: 5.34 MIL/uL (ref 4.22–5.81)
RDW: 13.4 % (ref 11.5–15.5)
WBC: 9.6 10*3/uL (ref 4.0–10.5)

## 2017-06-22 LAB — BASIC METABOLIC PANEL
Anion gap: 12 (ref 5–15)
BUN: 21 mg/dL — AB (ref 6–20)
CHLORIDE: 103 mmol/L (ref 101–111)
CO2: 20 mmol/L — AB (ref 22–32)
Calcium: 8.7 mg/dL — ABNORMAL LOW (ref 8.9–10.3)
Creatinine, Ser: 0.76 mg/dL (ref 0.61–1.24)
GFR calc Af Amer: >60 mL/min (ref >60)
GFR calc non Af Amer: >60 mL/min (ref >60)
Glucose, Bld: 199 mg/dL — ABNORMAL HIGH (ref 65–99)
POTASSIUM: 4.1 mmol/L (ref 3.5–5.1)
SODIUM: 135 mmol/L (ref 135–145)

## 2017-06-22 MED ORDER — DIPHENHYDRAMINE HCL 50 MG/ML IJ SOLN
50.0000 mg | Freq: Once | INTRAMUSCULAR | Status: AC
  Administered 2017-06-22: 50 mg via INTRAVENOUS
  Filled 2017-06-22: qty 1

## 2017-06-22 MED ORDER — FAMOTIDINE IN NACL 20-0.9 MG/50ML-% IV SOLN
20.0000 mg | Freq: Once | INTRAVENOUS | Status: AC
  Administered 2017-06-22: 20 mg via INTRAVENOUS
  Filled 2017-06-22: qty 50

## 2017-06-22 MED ORDER — EPINEPHRINE PF 1 MG/ML IJ SOLN
0.3000 mg | Freq: Once | INTRAMUSCULAR | Status: AC
  Administered 2017-06-22: 0.3 mg via INTRAMUSCULAR
  Filled 2017-06-22: qty 1

## 2017-06-22 MED ORDER — PREDNISONE 10 MG (21) PO TBPK: ORAL_TABLET | ORAL | 0 refills | Status: DC

## 2017-06-22 MED ORDER — SODIUM CHLORIDE 0.9 % IV BOLUS
1000.0000 mL | Freq: Once | INTRAVENOUS | Status: AC
  Administered 2017-06-22: 1000 mL via INTRAVENOUS

## 2017-06-22 MED ORDER — METHYLPREDNISOLONE SODIUM SUCC 125 MG IJ SOLR
125.0000 mg | Freq: Once | INTRAMUSCULAR | Status: AC
  Administered 2017-06-22: 125 mg via INTRAVENOUS
  Filled 2017-06-22: qty 2

## 2017-06-22 NOTE — ED Triage Notes (Signed)
Pt states that he woke up from his sleep 30 minutes ago with allergic reaction, hives itching, significant tongue swelling, feeling SOB. New medications include Novolog, Tresiba and telmisartan and hyrdochlorothiazide

## 2017-06-22 NOTE — Discharge Instructions (Signed)
Call Dr. Eloise HarmanPaterson tomorrow to determine what he should do for your long-term diabetes and blood pressure medications.  Stop all of the new medications.  Call 911 if you have recurrence of symptoms.  Take Benadryl every 6 hours today.  Restart your 70/30 insulin for glucose control.

## 2017-06-22 NOTE — ED Provider Notes (Signed)
[facility] EMERGENCY DEPARTMENT Provider Note   CSN: [id number] Arrival date & time: 06/22/17  0200     History   Chief Complaint Chief Complaint  Patient presents with  . Allergic Reaction    HPI [name] is a 42 y.o. male.  Patient presents to the ER for evaluation of allergic reaction.  Patient reports that he was awakened from sleep 1 hour ago with itching and burning of his skin.  He notices a splotchy red rash all over.  Patient reports that his tongue feels swollen and thick and he feels tightness in his chest with his breathing.  He just started 3 new medications (Novolog, Tresiba and telmisartan and hyrdochlorothiazide) within the last 2 days.     Past Medical History:  Diagnosis Date  . Diabetes mellitus   . Hypercholesteremia   . Hypertension   . MRSA (methicillin resistant staph aureus) culture positive   . Obesity   . Obesity   . Pancreatitis   . Umbilical hernia     There are no active problems to display for this patient.   Past Surgical History:  Procedure Laterality Date  . TESTICLE REMOVAL  right testicle        Home Medications    Prior to Admission medications   Medication Sig Start Date End Date Taking? Authorizing Provider  amLODipine (NORVASC) 5 MG tablet Take 5 mg by mouth daily. 06/06/17  Yes [provider]  gabapentin (NEURONTIN) 300 MG capsule Take 300 mg by mouth 2 (two) times daily.   Yes [provider]  insulin aspart (NOVOLOG) 100 UNIT/ML injection Inject 10 Units into the skin 3 (three) times daily before meals.   Yes [provider]  insulin degludec (TRESIBA FLEXTOUCH) 100 UNIT/ML SOPN FlexTouch Pen Inject 60 Units into the skin daily at 10 pm.   Yes [provider]  metFORMIN (GLUCOPHAGE-XR) 500 MG 24 hr tablet Take 500 mg by mouth daily. 06/06/17  Yes [provider]  pioglitazone (ACTOS) 15 MG tablet Take 15 mg by mouth daily. 06/06/17  Yes [provider]  propranolol (INDERAL) 10 MG tablet Take 10 mg by mouth daily. 06/06/17  Yes [provider]  telmisartan-hydrochlorothiazide (MICARDIS HCT) 80-12.5 MG tablet Take 1 tablet by mouth daily.   Yes [provider]  cyclobenzaprine (FLEXERIL) 10 MG tablet Take 0.5-1 tablets (5-10 mg total) by mouth 3 (three) times daily as needed for muscle spasms. Do not mix with narcotics. May cause drowsiness. Patient not taking: Reported on 04/24/2017 12/29/16   Ofilia Neas, PA-C  meloxicam (MOBIC) 15 MG tablet Take 1 tablet (15 mg total) by mouth daily. Patient not taking: Reported on 06/22/2017 04/24/17   Tyrell Antonio, MD  PROPRANOLOL HCL PO Take by mouth.    [provider]  traMADol (ULTRAM) 50 MG tablet Take 0.5-1 tablets (25-50 mg total) by mouth every 6 (six) hours as needed. Patient not taking: Reported on 04/24/2017 12/29/16   Ofilia Neas, PA-C    Family History No family history on file.  Social History Social History   Tobacco Use  . Smoking status: Current Some Day Smoker  . Smokeless tobacco: Never Used  Substance Use Topics  . Alcohol use: No  . Drug use: No     Allergies   Patient has no known allergies.   Review of Systems Review of Systems  HENT:       Tongue swelling  Respiratory: Positive for chest tightness and shortness  of breath.   Skin: Positive for rash.  All other systems reviewed and are negative.    Physical Exam Updated Vital Signs BP 110/72   Pulse 92   Temp 98 F (36.7 C) (Oral)   Resp (!) 21   SpO2 93%   Physical Exam  Constitutional: He is oriented to person, place, and time. He appears well-developed and well-nourished. No distress.  HENT:  Head: Normocephalic and atraumatic.  Right Ear: Hearing normal.  Left Ear: Hearing normal.  Nose: Nose normal.  Mouth/Throat: Oropharynx is clear and moist and mucous membranes are normal.  Eyes: Pupils are equal, round, and reactive to light. Conjunctivae and EOM are  normal.  Neck: Normal range of motion. Neck supple.  Cardiovascular: Regular rhythm, S1 normal and S2 normal. Exam reveals no gallop and no friction rub.  No murmur heard. Pulmonary/Chest: Effort normal and breath sounds normal. No respiratory distress. He exhibits no tenderness.  Abdominal: Soft. Normal appearance and bowel sounds are normal. There is no hepatosplenomegaly. There is no tenderness. There is no rebound, no guarding, no tenderness at McBurney's point and negative Murphy's sign. No hernia.  Musculoskeletal: Normal range of motion.  Neurological: He is alert and oriented to person, place, and time. He has normal strength. No cranial nerve deficit or sensory deficit. Coordination normal. GCS eye subscore is 4. GCS verbal subscore is 5. GCS motor subscore is 6.  Skin: Skin is warm, dry and intact. No rash (Diffuse urticaria) noted. No cyanosis.  Psychiatric: He has a normal mood and affect. His speech is normal and behavior is normal. Thought content normal.  Nursing note and vitals reviewed.    ED Treatments / Results  Labs (all labs ordered are listed, but only abnormal results are displayed) Labs Reviewed  CBC WITH DIFFERENTIAL/PLATELET - Abnormal; Notable for the following components:      Result Value   Platelets 124 (*)    Lymphs Abs 4.5 (*)    All other components within normal limits  BASIC METABOLIC PANEL - Abnormal; Notable for the following components:   CO2 20 (*)    Glucose, Bld 199 (*)    BUN 21 (*)    Calcium 8.7 (*)    All other components within normal limits    EKG EKG Interpretation  Date/Time:  Sunday June 22 2017 02:27:51 EDT Ventricular Rate:  93 PR Interval:    QRS Duration: 105 QT Interval:  379 QTC Calculation: 472 R Axis:   -72 Text Interpretation:  Sinus rhythm Inferior infarct, old Probable anteroseptal infarct, old Confirmed by Gilda Crease [phone]) on 06/22/2017 2:36:36 AM   Radiology No results  found.  Procedures Procedures (including critical care time)  Medications Ordered in ED Medications  sodium chloride 0.9 % bolus 1,000 mL (1,000 mLs Intravenous New Bag/Given 06/22/17 0258)  EPINEPHrine (ADRENALIN) 0.3 mg (0.3 mg Intramuscular Given 06/22/17 0253)  methylPREDNISolone sodium succinate (SOLU-MEDROL) 125 mg/2 mL injection 125 mg (125 mg Intravenous Given 06/22/17 0253)  diphenhydrAMINE (BENADRYL) injection 50 mg (50 mg Intravenous Given 06/22/17 0254)  famotidine (PEPCID) IVPB 20 mg premix (0 mg Intravenous Stopped 06/22/17 0342)     Initial Impression / Assessment and Plan / ED Course  I have reviewed the triage vital signs and the nursing notes.  Pertinent labs & imaging results that were available during my care of the patient were reviewed by me and considered in my medical decision making (see chart for details).     Patient presents to the ER  with acute allergic reaction.  Patient had sudden onset of urticaria with tongue swelling and slight shortness of breath.  Patient administered epinephrine, Benadryl, Pepcid, Solu-Medrol.  He has had complete resolution of symptoms.  Patient monitored for an extended period of time with no rebound.  Trigger for the allergic reaction is unclear.  Patient has started for new medications in the last 2 days.  Will hold off on all of these medications.  Patient will refer back to his previous 70/30 insulin dosing and follow-up with his primary doctor tomorrow for further medication instructions.  We will give prednisone taper and have patient take Benadryl p.o.  Final Clinical Impressions(s) / ED Diagnoses   Final diagnoses:  Anaphylaxis, initial encounter    ED Discharge Orders    None       Gilda CreasePollina, Cyril Railey J, MD 06/22/17 [id number]

## 2017-06-27 DIAGNOSIS — L509 Urticaria, unspecified: Secondary | ICD-10-CM | POA: Insufficient documentation

## 2017-10-06 ENCOUNTER — Other Ambulatory Visit: Payer: Self-pay

## 2017-10-06 ENCOUNTER — Observation Stay (HOSPITAL_COMMUNITY)
Admission: EM | Admit: 2017-10-06 | Discharge: 2017-10-07 | Disposition: A | Discharge status: Home / Self Care | Payer: BLUE CROSS/BLUE SHIELD | Attending: Oncology | Admitting: Oncology

## 2017-10-06 DIAGNOSIS — E669 Obesity, unspecified: Secondary | ICD-10-CM | POA: Diagnosis not present

## 2017-10-06 DIAGNOSIS — Z794 Long term (current) use of insulin: Secondary | ICD-10-CM | POA: Insufficient documentation

## 2017-10-06 DIAGNOSIS — F17210 Nicotine dependence, cigarettes, uncomplicated: Secondary | ICD-10-CM | POA: Diagnosis not present

## 2017-10-06 DIAGNOSIS — G459 Transient cerebral ischemic attack, unspecified: Principal | ICD-10-CM | POA: Insufficient documentation

## 2017-10-06 DIAGNOSIS — Z8614 Personal history of Methicillin resistant Staphylococcus aureus infection: Secondary | ICD-10-CM | POA: Diagnosis not present

## 2017-10-06 DIAGNOSIS — D696 Thrombocytopenia, unspecified: Secondary | ICD-10-CM | POA: Insufficient documentation

## 2017-10-06 DIAGNOSIS — Z6841 Body Mass Index (BMI) 40.0 and over, adult: Secondary | ICD-10-CM | POA: Diagnosis not present

## 2017-10-06 DIAGNOSIS — Z823 Family history of stroke: Secondary | ICD-10-CM | POA: Diagnosis not present

## 2017-10-06 DIAGNOSIS — I10 Essential (primary) hypertension: Secondary | ICD-10-CM | POA: Insufficient documentation

## 2017-10-06 DIAGNOSIS — Z23 Encounter for immunization: Secondary | ICD-10-CM | POA: Diagnosis not present

## 2017-10-06 DIAGNOSIS — M4802 Spinal stenosis, cervical region: Secondary | ICD-10-CM | POA: Insufficient documentation

## 2017-10-06 DIAGNOSIS — M542 Cervicalgia: Secondary | ICD-10-CM | POA: Diagnosis present

## 2017-10-06 DIAGNOSIS — R270 Ataxia, unspecified: Secondary | ICD-10-CM | POA: Diagnosis not present

## 2017-10-06 DIAGNOSIS — M25559 Pain in unspecified hip: Secondary | ICD-10-CM | POA: Insufficient documentation

## 2017-10-06 DIAGNOSIS — G8929 Other chronic pain: Secondary | ICD-10-CM | POA: Insufficient documentation

## 2017-10-06 DIAGNOSIS — E119 Type 2 diabetes mellitus without complications: Secondary | ICD-10-CM | POA: Diagnosis not present

## 2017-10-06 DIAGNOSIS — F419 Anxiety disorder, unspecified: Secondary | ICD-10-CM | POA: Insufficient documentation

## 2017-10-06 DIAGNOSIS — E785 Hyperlipidemia, unspecified: Secondary | ICD-10-CM | POA: Insufficient documentation

## 2017-10-06 DIAGNOSIS — Z79899 Other long term (current) drug therapy: Secondary | ICD-10-CM | POA: Diagnosis not present

## 2017-10-06 DIAGNOSIS — R51 Headache: Secondary | ICD-10-CM | POA: Diagnosis not present

## 2017-10-06 DIAGNOSIS — E7800 Pure hypercholesterolemia, unspecified: Secondary | ICD-10-CM | POA: Insufficient documentation

## 2017-10-06 DIAGNOSIS — I6502 Occlusion and stenosis of left vertebral artery: Secondary | ICD-10-CM

## 2017-10-06 DIAGNOSIS — Z7982 Long term (current) use of aspirin: Secondary | ICD-10-CM | POA: Insufficient documentation

## 2017-10-06 DIAGNOSIS — R110 Nausea: Secondary | ICD-10-CM | POA: Diagnosis not present

## 2017-10-06 HISTORY — DX: Low back pain: M54.5

## 2017-10-06 HISTORY — DX: Unspecified osteoarthritis, unspecified site: M19.90

## 2017-10-06 HISTORY — DX: Acute pancreatitis without necrosis or infection, unspecified: K85.90

## 2017-10-06 HISTORY — DX: Sleep apnea, unspecified: G47.30

## 2017-10-06 HISTORY — DX: Type 2 diabetes mellitus without complications: E11.9

## 2017-10-06 HISTORY — DX: Other chronic pain: G89.29

## 2017-10-06 HISTORY — DX: Transient cerebral ischemic attack, unspecified: G45.9

## 2017-10-06 HISTORY — DX: Headache: R51

## 2017-10-06 HISTORY — DX: Scoliosis, unspecified: M41.9

## 2017-10-06 HISTORY — DX: Headache, unspecified: R51.9

## 2017-10-06 HISTORY — DX: Anxiety disorder, unspecified: F41.9

## 2017-10-06 NOTE — ED Notes (Signed)
Last known well was 2030

## 2017-10-06 NOTE — ED Triage Notes (Signed)
Pt presents to Ed from home for neck pain and unsteady gait. Pt reports he was sitting having dinner when he felt a "pop" In his neck and had instant head and neck pain. Pt reports that when he stood up to walk he almost fell over d/t lack of balance.

## 2017-10-07 ENCOUNTER — Emergency Department (HOSPITAL_COMMUNITY): Payer: BLUE CROSS/BLUE SHIELD

## 2017-10-07 ENCOUNTER — Inpatient Hospital Stay (HOSPITAL_COMMUNITY): Payer: BLUE CROSS/BLUE SHIELD

## 2017-10-07 ENCOUNTER — Encounter (HOSPITAL_COMMUNITY): Payer: Self-pay

## 2017-10-07 DIAGNOSIS — F419 Anxiety disorder, unspecified: Secondary | ICD-10-CM | POA: Diagnosis not present

## 2017-10-07 DIAGNOSIS — Z823 Family history of stroke: Secondary | ICD-10-CM | POA: Diagnosis not present

## 2017-10-07 DIAGNOSIS — Z794 Long term (current) use of insulin: Secondary | ICD-10-CM | POA: Diagnosis not present

## 2017-10-07 DIAGNOSIS — G459 Transient cerebral ischemic attack, unspecified: Secondary | ICD-10-CM | POA: Diagnosis present

## 2017-10-07 DIAGNOSIS — I10 Essential (primary) hypertension: Secondary | ICD-10-CM

## 2017-10-07 DIAGNOSIS — Z8614 Personal history of Methicillin resistant Staphylococcus aureus infection: Secondary | ICD-10-CM | POA: Diagnosis not present

## 2017-10-07 DIAGNOSIS — M542 Cervicalgia: Secondary | ICD-10-CM | POA: Diagnosis present

## 2017-10-07 DIAGNOSIS — M4802 Spinal stenosis, cervical region: Secondary | ICD-10-CM | POA: Diagnosis not present

## 2017-10-07 DIAGNOSIS — R270 Ataxia, unspecified: Secondary | ICD-10-CM | POA: Diagnosis not present

## 2017-10-07 DIAGNOSIS — I5030 Unspecified diastolic (congestive) heart failure: Secondary | ICD-10-CM

## 2017-10-07 DIAGNOSIS — G8929 Other chronic pain: Secondary | ICD-10-CM

## 2017-10-07 DIAGNOSIS — Z7982 Long term (current) use of aspirin: Secondary | ICD-10-CM | POA: Diagnosis not present

## 2017-10-07 DIAGNOSIS — Z79899 Other long term (current) drug therapy: Secondary | ICD-10-CM | POA: Diagnosis not present

## 2017-10-07 DIAGNOSIS — E785 Hyperlipidemia, unspecified: Secondary | ICD-10-CM | POA: Diagnosis not present

## 2017-10-07 DIAGNOSIS — I6502 Occlusion and stenosis of left vertebral artery: Secondary | ICD-10-CM | POA: Diagnosis present

## 2017-10-07 DIAGNOSIS — E119 Type 2 diabetes mellitus without complications: Secondary | ICD-10-CM | POA: Diagnosis not present

## 2017-10-07 DIAGNOSIS — E669 Obesity, unspecified: Secondary | ICD-10-CM

## 2017-10-07 DIAGNOSIS — R51 Headache: Secondary | ICD-10-CM | POA: Diagnosis not present

## 2017-10-07 DIAGNOSIS — R110 Nausea: Secondary | ICD-10-CM | POA: Diagnosis not present

## 2017-10-07 DIAGNOSIS — E7800 Pure hypercholesterolemia, unspecified: Secondary | ICD-10-CM | POA: Diagnosis not present

## 2017-10-07 DIAGNOSIS — M25559 Pain in unspecified hip: Secondary | ICD-10-CM | POA: Diagnosis not present

## 2017-10-07 DIAGNOSIS — Z6841 Body Mass Index (BMI) 40.0 and over, adult: Secondary | ICD-10-CM | POA: Diagnosis not present

## 2017-10-07 DIAGNOSIS — Z23 Encounter for immunization: Secondary | ICD-10-CM | POA: Diagnosis not present

## 2017-10-07 DIAGNOSIS — Z8719 Personal history of other diseases of the digestive system: Secondary | ICD-10-CM

## 2017-10-07 DIAGNOSIS — D696 Thrombocytopenia, unspecified: Secondary | ICD-10-CM | POA: Diagnosis not present

## 2017-10-07 DIAGNOSIS — Z7902 Long term (current) use of antithrombotics/antiplatelets: Secondary | ICD-10-CM

## 2017-10-07 DIAGNOSIS — E781 Pure hyperglyceridemia: Secondary | ICD-10-CM

## 2017-10-07 DIAGNOSIS — F17210 Nicotine dependence, cigarettes, uncomplicated: Secondary | ICD-10-CM | POA: Diagnosis not present

## 2017-10-07 LAB — CBC WITH DIFFERENTIAL/PLATELET
BASOS ABS: 0 10*3/uL (ref 0.0–0.1)
BASOS PCT: 0 %
EOS ABS: 0.1 10*3/uL (ref 0.0–0.7)
Eosinophils Relative: 2 %
HCT: 43 % (ref 39.0–52.0)
HEMOGLOBIN: 15.4 g/dL (ref 13.0–17.0)
LYMPHS ABS: 2 10*3/uL (ref 0.7–4.0)
Lymphocytes Relative: 27 %
MCH: 32 pg (ref 26.0–34.0)
MCHC: 35.8 g/dL (ref 30.0–36.0)
MCV: 89.4 fL (ref 78.0–100.0)
Monocytes Absolute: 0.3 10*3/uL (ref 0.1–1.0)
Monocytes Relative: 3 %
NEUTROS PCT: 68 %
Neutro Abs: 5.2 10*3/uL (ref 1.7–7.7)
PLATELETS: 98 10*3/uL — AB (ref 150–400)
RBC: 4.81 MIL/uL (ref 4.22–5.81)
RDW: 12.9 % (ref 11.5–15.5)
WBC: 7.6 10*3/uL (ref 4.0–10.5)

## 2017-10-07 LAB — LIPID PANEL
CHOLESTEROL: 254 mg/dL — AB (ref 0–200)
HDL: 36 mg/dL — AB (ref >40)
LDL Cholesterol: UNDETERMINED mg/dL (ref 0–99)
TRIGLYCERIDES: 775 mg/dL — AB (ref <150)
Total CHOL/HDL Ratio: 7.1 RATIO
VLDL: UNDETERMINED mg/dL (ref 0–40)

## 2017-10-07 LAB — BASIC METABOLIC PANEL
Anion gap: 9 (ref 5–15)
BUN: 13 mg/dL (ref 6–20)
CALCIUM: 8.5 mg/dL — AB (ref 8.9–10.3)
CO2: 26 mmol/L (ref 22–32)
CREATININE: 0.9 mg/dL (ref 0.61–1.24)
Chloride: 105 mmol/L (ref 98–111)
Glucose, Bld: 278 mg/dL — ABNORMAL HIGH (ref 70–99)
Potassium: 4.3 mmol/L (ref 3.5–5.1)
SODIUM: 140 mmol/L (ref 135–145)

## 2017-10-07 LAB — SAVE SMEAR

## 2017-10-07 LAB — GLUCOSE, CAPILLARY
GLUCOSE-CAPILLARY: 227 mg/dL — AB (ref 70–99)
GLUCOSE-CAPILLARY: 235 mg/dL — AB (ref 70–99)

## 2017-10-07 LAB — ECHOCARDIOGRAM COMPLETE
Height: 66 in
Weight: 4160 oz

## 2017-10-07 LAB — MRSA PCR SCREENING: MRSA BY PCR: NEGATIVE

## 2017-10-07 LAB — HEMOGLOBIN A1C
Hgb A1c MFr Bld: 9.4 % — ABNORMAL HIGH (ref 4.8–5.6)
MEAN PLASMA GLUCOSE: 223.08 mg/dL

## 2017-10-07 MED ORDER — STROKE: EARLY STAGES OF RECOVERY BOOK
Freq: Once | Status: AC
  Administered 2017-10-07: 13:00:00
  Filled 2017-10-07: qty 1

## 2017-10-07 MED ORDER — NICOTINE 21 MG/24HR TD PT24
21.0000 mg | MEDICATED_PATCH | Freq: Every day | TRANSDERMAL | Status: DC
  Administered 2017-10-07: 21 mg via TRANSDERMAL
  Filled 2017-10-07: qty 1

## 2017-10-07 MED ORDER — CLOPIDOGREL BISULFATE 300 MG PO TABS
300.0000 mg | ORAL_TABLET | Freq: Once | ORAL | Status: AC
  Administered 2017-10-07: 300 mg via ORAL
  Filled 2017-10-07: qty 1

## 2017-10-07 MED ORDER — ASPIRIN EC 81 MG PO TBEC: 81.0000 mg | DELAYED_RELEASE_TABLET | Freq: Every day | ORAL | Status: DC

## 2017-10-07 MED ORDER — INSULIN ASPART 100 UNIT/ML ~~LOC~~ SOLN
0.0000 [IU] | Freq: Three times a day (TID) | SUBCUTANEOUS | Status: DC
  Administered 2017-10-07 (×2): 5 [IU] via SUBCUTANEOUS

## 2017-10-07 MED ORDER — ATORVASTATIN CALCIUM 40 MG PO TABS
40.0000 mg | ORAL_TABLET | Freq: Every day | ORAL | Status: DC
  Administered 2017-10-07: 40 mg via ORAL
  Filled 2017-10-07: qty 1

## 2017-10-07 MED ORDER — TETRAHYDROZOLINE HCL 0.05 % OP SOLN
1.0000 [drp] | Freq: Every day | OPHTHALMIC | Status: DC | PRN
  Filled 2017-10-07: qty 15

## 2017-10-07 MED ORDER — GABAPENTIN 300 MG PO CAPS
300.0000 mg | ORAL_CAPSULE | Freq: Two times a day (BID) | ORAL | Status: DC
  Administered 2017-10-07: 300 mg via ORAL
  Filled 2017-10-07: qty 1

## 2017-10-07 MED ORDER — ASPIRIN 81 MG PO TBEC: 81.0000 mg | DELAYED_RELEASE_TABLET | Freq: Every day | ORAL | 0 refills | Status: DC

## 2017-10-07 MED ORDER — IOPAMIDOL (ISOVUE-370) INJECTION 76%
100.0000 mL | Freq: Once | INTRAVENOUS | Status: AC | PRN
  Administered 2017-10-07: 100 mL via INTRAVENOUS

## 2017-10-07 MED ORDER — CLOPIDOGREL BISULFATE 75 MG PO TABS: 75.0000 mg | ORAL_TABLET | Freq: Every day | ORAL | 0 refills | Status: DC

## 2017-10-07 MED ORDER — ASPIRIN EC 81 MG PO TBEC
81.0000 mg | DELAYED_RELEASE_TABLET | Freq: Once | ORAL | Status: AC
  Administered 2017-10-07: 81 mg via ORAL
  Filled 2017-10-07: qty 1

## 2017-10-07 MED ORDER — ACETAMINOPHEN 325 MG PO TABS: 650.0000 mg | ORAL_TABLET | ORAL | Status: DC | PRN

## 2017-10-07 MED ORDER — PNEUMOCOCCAL VAC POLYVALENT 25 MCG/0.5ML IJ INJ
0.5000 mL | INJECTION | INTRAMUSCULAR | Status: AC
  Administered 2017-10-07: 0.5 mL via INTRAMUSCULAR
  Filled 2017-10-07: qty 0.5

## 2017-10-07 MED ORDER — INSULIN ASPART 100 UNIT/ML ~~LOC~~ SOLN
4.0000 [IU] | Freq: Three times a day (TID) | SUBCUTANEOUS | Status: DC
  Administered 2017-10-07 (×2): 4 [IU] via SUBCUTANEOUS

## 2017-10-07 MED ORDER — LORAZEPAM 2 MG/ML IJ SOLN
1.0000 mg | Freq: Once | INTRAMUSCULAR | Status: AC
  Administered 2017-10-07: 1 mg via INTRAVENOUS
  Filled 2017-10-07: qty 1

## 2017-10-07 MED ORDER — NICOTINE 21 MG/24HR TD PT24: 21.0000 mg | MEDICATED_PATCH | Freq: Every day | TRANSDERMAL | 0 refills | Status: DC

## 2017-10-07 MED ORDER — IBUPROFEN 200 MG PO TABS
800.0000 mg | ORAL_TABLET | Freq: Every day | ORAL | Status: DC | PRN
  Administered 2017-10-07: 800 mg via ORAL
  Filled 2017-10-07: qty 4

## 2017-10-07 MED ORDER — AMLODIPINE BESYLATE 10 MG PO TABS: 10.0000 mg | ORAL_TABLET | Freq: Every day | ORAL | 0 refills | Status: AC

## 2017-10-07 MED ORDER — IOPAMIDOL (ISOVUE-370) INJECTION 76%
INTRAVENOUS | Status: AC
  Filled 2017-10-07: qty 100

## 2017-10-07 MED ORDER — ASPIRIN 325 MG PO TABS: 325.0000 mg | ORAL_TABLET | Freq: Once | ORAL | Status: DC

## 2017-10-07 MED ORDER — ACETAMINOPHEN 650 MG RE SUPP: 650.0000 mg | RECTAL | Status: DC | PRN

## 2017-10-07 MED ORDER — CLOPIDOGREL BISULFATE 75 MG PO TABS
75.0000 mg | ORAL_TABLET | Freq: Every day | ORAL | Status: DC
  Filled 2017-10-07 (×2): qty 1

## 2017-10-07 MED ORDER — INSULIN GLARGINE 100 UNIT/ML ~~LOC~~ SOLN
40.0000 [IU] | Freq: Every day | SUBCUTANEOUS | Status: DC
  Filled 2017-10-07: qty 0.4

## 2017-10-07 MED ORDER — METFORMIN HCL ER 500 MG PO TB24: 1000.0000 mg | ORAL_TABLET | Freq: Two times a day (BID) | ORAL | 2 refills | Status: AC

## 2017-10-07 MED ORDER — ACETAMINOPHEN 160 MG/5ML PO SOLN: 650.0000 mg | ORAL | Status: DC | PRN

## 2017-10-07 MED ORDER — ATORVASTATIN CALCIUM 40 MG PO TABS: 40.0000 mg | ORAL_TABLET | Freq: Every day | ORAL | 0 refills | Status: DC

## 2017-10-07 NOTE — ED Provider Notes (Signed)
[facility]-EMERGENCY DEPT Provider Note   CSN: [id number] Arrival date & time: 10/06/17  2114     History   Chief Complaint Chief Complaint  Patient presents with  . Neck Pain  . Gait Problem  . Headache    HPI [name] is a 42 y.o. male.  Patient is a 42 year old male with past medical history of obesity, diabetes, hypertension.  He presents today for evaluation of headache and neck pain.  He states that earlier this evening he felt a pop in the back of his neck followed by headache and disorientation.  The wife describes difficulty with his gait prior to presentation.  Patient denies any visual disturbances.  He denies any injury or trauma.  The history is provided by the patient.  Neck Pain   This is a new problem. The current episode started 3 to 5 hours ago. The problem occurs constantly. The problem has been gradually improving. The pain is associated with nothing. There has been no fever. The pain is present in the occipital region. The quality of the pain is described as stabbing. The pain does not radiate. The pain is moderate. Associated symptoms include headaches.  Headache      Past Medical History:  Diagnosis Date  . Diabetes mellitus   . Hypercholesteremia   . Hypertension   . MRSA (methicillin resistant staph aureus) culture positive   . Obesity   . Obesity   . Pancreatitis   . Umbilical hernia     There are no active problems to display for this patient.   Past Surgical History:  Procedure Laterality Date  . TESTICLE REMOVAL  right testicle        Home Medications    Prior to Admission medications   Medication Sig Start Date End Date Taking? Authorizing Provider  amLODipine (NORVASC) 5 MG tablet Take 5 mg by mouth daily. 06/06/17  Yes [provider]  diphenhydrAMINE HCl (ZZZQUIL) 50 MG/30ML LIQD Take 30 mLs by mouth daily as needed (sleep).   Yes [provider]  gabapentin (NEURONTIN) 300 MG capsule  Take 300 mg by mouth 2 (two) times daily.   Yes [provider]  ibuprofen (ADVIL,MOTRIN) 200 MG tablet Take 800 mg by mouth daily as needed for moderate pain.   Yes [provider]  insulin NPH-regular Human (NOVOLIN 70/30) (70-30) 100 UNIT/ML injection Inject 40-50 Units into the skin See admin instructions. 50 units in the am and 40 units at night   Yes [provider]  metFORMIN (GLUCOPHAGE-XR) 500 MG 24 hr tablet Take 500 mg by mouth daily. 06/06/17  Yes [provider]  pioglitazone (ACTOS) 15 MG tablet Take 15 mg by mouth daily. 06/06/17  Yes [provider]  propranolol (INDERAL) 10 MG tablet Take 10 mg by mouth daily. 06/06/17  Yes [provider]  Tetrahydrozoline HCl (VISINE OP) Apply 2 drops to eye daily as needed (red eye).   Yes [provider]  cyclobenzaprine (FLEXERIL) 10 MG tablet Take 0.5-1 tablets (5-10 mg total) by mouth 3 (three) times daily as needed for muscle spasms. Do not mix with narcotics. May cause drowsiness. Patient not taking: Reported on 04/24/2017 12/29/16   Ofilia Neas, PA-C  meloxicam (MOBIC) 15 MG tablet Take 1 tablet (15 mg total) by mouth daily. Patient not taking: Reported on 06/22/2017 04/24/17   Tyrell Antonio, MD  predniSONE (STERAPRED UNI-PAK 21 TAB) 10 MG (21) TBPK tablet Taper as directed Patient not taking: Reported on  10/06/2017 06/22/17   Gilda Crease, MD  traMADol (ULTRAM) 50 MG tablet Take 0.5-1 tablets (25-50 mg total) by mouth every 6 (six) hours as needed. Patient not taking: Reported on 04/24/2017 12/29/16   Ofilia Neas, PA-C    Family History No family history on file.  Social History Social History   Tobacco Use  . Smoking status: Current Some Day Smoker  . Smokeless tobacco: Never Used  Substance Use Topics  . Alcohol use: No  . Drug use: No     Allergies   Patient has no known allergies.   Review of Systems Review of Systems  Musculoskeletal:  Positive for neck pain.  Neurological: Positive for headaches.  All other systems reviewed and are negative.    Physical Exam Updated Vital Signs BP (!) 177/99 (BP Location: Left Arm)   Pulse 92   Temp 98.5 F (36.9 C) (Oral)   Resp 16   Ht 5\' 6"  (1.676 m)   Wt 117.9 kg (260 lb)   SpO2 97%   BMI 41.97 kg/m   Physical Exam  Constitutional: He is oriented to person, place, and time. He appears well-developed and well-nourished. No distress.  HENT:  Head: Normocephalic and atraumatic.  Mouth/Throat: Oropharynx is clear and moist.  Eyes: Pupils are equal, round, and reactive to light. EOM are normal.  Neck: Normal range of motion. Neck supple.  Cardiovascular: Normal rate and regular rhythm. Exam reveals no friction rub.  No murmur heard. Pulmonary/Chest: Effort normal and breath sounds normal. No respiratory distress. He has no wheezes. He has no rales.  Abdominal: Soft. Bowel sounds are normal. He exhibits no distension. There is no tenderness.  Musculoskeletal: Normal range of motion. He exhibits no edema.  Neurological: He is alert and oriented to person, place, and time. He is not disoriented. No cranial nerve deficit. Coordination normal.  Skin: Skin is warm and dry. He is not diaphoretic.  Nursing note and vitals reviewed.    ED Treatments / Results  Labs (all labs ordered are listed, but only abnormal results are displayed) Labs Reviewed  BASIC METABOLIC PANEL  CBC WITH DIFFERENTIAL/PLATELET    EKG None  Radiology No results found.  Procedures Procedures (including critical care time)  Medications Ordered in ED Medications - No data to display   Initial Impression / Assessment and Plan / ED Course  I have reviewed the triage vital signs and the nursing notes.  Pertinent labs & imaging results that were available during my care of the patient were reviewed by me and considered in my medical decision making (see chart for details).  Patient presenting  with acute onset of neck pain, headache, disorientation, and dizziness.  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  This finding was discussed with Dr. Laurence Slate from Neurology who wants patient transferred to [facility] for MRI.  Dr. Wilkie Aye aware in [facility] ED.  CRITICAL CARE Performed by: Geoffery Lyons Total critical care time: 35 minutes Critical care time was exclusive of separately billable procedures and treating other patients. Critical care was necessary to treat or prevent imminent or life-threatening deterioration. Critical care was time spent personally by me on the following activities: development of treatment plan with patient and/or surrogate as well as nursing, discussions with consultants, evaluation of patient's response to treatment, examination of patient, obtaining history from patient or surrogate, ordering and performing treatments and interventions, ordering and review of laboratory studies, ordering and review of radiographic studies, pulse oximetry and re-evaluation  of patient's condition.   Final Clinical Impressions(s) / ED Diagnoses   Final diagnoses:  None    ED Discharge Orders    None       Geoffery Lyonselo, Kyley Laurel, MD 10/07/17 [phone]

## 2017-10-07 NOTE — Care Management Note (Signed)
Case Management Note  Patient Details  Name: [name] MRN: [id number] Date of Birth: 03/22/1975  Subjective/Objective:   Pt in with a headache. He is from home.                  Action/Plan: Pt is discharging home with self care. Pt has a PCP, insurance and transportation home.   Expected Discharge Date:  10/07/17               Expected Discharge Plan:  Home/Self Care  In-House Referral:     Discharge planning Services     Post Acute Care Choice:    Choice offered to:     DME Arranged:    DME Agency:     HH Arranged:    HH Agency:     Status of Service:  Completed, signed off  If discussed at [employer] Length [employer] Stay Meetings, dates discussed:    Additional Comments:  [name], RN 10/07/2017, 4:40 PM

## 2017-10-07 NOTE — ED Notes (Signed)
ED TO INPATIENT HANDOFF REPORT  Name/Age/Gender [name] 42 y.o. male  Code Status   Home/SNF/Other Home  Chief Complaint balance off neck pain   Level of Care/Admitting Diagnosis ED Disposition    ED Disposition Condition [facility] Area: [place] [[id number]]  Level of Care: Telemetry [5]  Diagnosis: TIA (transient ischemic attack) [569794]  Admitting Physician: Annia Belt [[id number]]  Attending Physician: Annia Belt [[id number]]  Estimated length of stay: past midnight tomorrow  Certification:: I certify this patient will need inpatient services for at least 2 midnights  PT Class (Do Not Modify): Inpatient [101]  PT Acc Code (Do Not Modify): Private [1]       Medical History Past Medical History:  Diagnosis Date  . Diabetes mellitus   . Hypercholesteremia   . Hypertension   . MRSA (methicillin resistant staph aureus) culture positive   . Obesity   . Obesity   . Pancreatitis   . Umbilical hernia     Allergies No Known Allergies  IV Location/Drains/Wounds Patient Lines/Drains/Airways Status   Active Line/Drains/Airways    Name:   Placement date:   Placement time:   Site:   Days:   Peripheral IV 10/07/17 Right Antecubital   10/07/17    0031    Antecubital   less than 1          Labs/Imaging Results for orders placed or performed during the hospital encounter of 10/06/17 (from the past 48 hour(s))  Basic metabolic panel     Status: Abnormal   Collection Time: 10/07/17 12:31 AM  Result Value Ref Range   Sodium 140 135 - 145 mmol/L   Potassium 4.3 3.5 - 5.1 mmol/L   Chloride 105 98 - 111 mmol/L   CO2 26 22 - 32 mmol/L   Glucose, Bld 278 (H) 70 - 99 mg/dL   BUN 13 6 - 20 mg/dL   Creatinine, Ser 0.90 0.61 - 1.24 mg/dL   Calcium 8.5 (L) 8.9 - 10.3 mg/dL   GFR calc non Af Amer >60 >60 mL/min   GFR calc Af Amer >60 >60 mL/min    Comment: (NOTE) The eGFR has been calculated using the CKD EPI equation. This  calculation has not been validated in all clinical situations. eGFR's persistently <60 mL/min signify possible Chronic Kidney Disease.    Anion gap 9 5 - 15    Comment: Performed at [facility], [place] [address] [place], [place] [address]  CBC with Differential     Status: Abnormal   Collection Time: 10/07/17 12:31 AM  Result Value Ref Range   WBC 7.6 4.0 - 10.5 K/uL   RBC 4.81 4.22 - 5.81 MIL/uL   Hemoglobin 15.4 13.0 - 17.0 g/dL   HCT 43.0 39.0 - 52.0 %   MCV 89.4 78.0 - 100.0 fL   MCH 32.0 26.0 - 34.0 pg   MCHC 35.8 30.0 - 36.0 g/dL   RDW 12.9 11.5 - 15.5 %   Platelets 98 (L) 150 - 400 K/uL    Comment: REPEATED TO VERIFY SPECIMEN CHECKED FOR CLOTS PLATELET COUNT CONFIRMED BY SMEAR    Neutrophils Relative % 68 %   Neutro Abs 5.2 1.7 - 7.7 K/uL   Lymphocytes Relative 27 %   Lymphs Abs 2.0 0.7 - 4.0 K/uL   Monocytes Relative 3 %   Monocytes Absolute 0.3 0.1 - 1.0 K/uL   Eosinophils Relative 2 %   Eosinophils Absolute 0.1 0.0 - 0.7  K/uL   Basophils Relative 0 %   Basophils Absolute 0.0 0.0 - 0.1 K/uL    Comment: Performed at [facility], [place] [address] [place], [place] [address]  Hemoglobin A1c     Status: Abnormal   Collection Time: 10/07/17  9:21 AM  Result Value Ref Range   Hgb A1c MFr Bld 9.4 (H) 4.8 - 5.6 %    Comment: (NOTE) Pre diabetes:          5.7%-6.4% Diabetes:              >6.4% Glycemic control for   <7.0% adults with diabetes    Mean Plasma Glucose 223.08 mg/dL    Comment: Performed at [facility], [place] [address], [place], [place] [address]  Lipid panel     Status: Abnormal   Collection Time: 10/07/17  9:21 AM  Result Value Ref Range   Cholesterol 254 (H) 0 - 200 mg/dL   Triglycerides 775 (H) <150 mg/dL   HDL 36 (L) >40 mg/dL   Total CHOL/HDL Ratio 7.1 RATIO   VLDL UNABLE TO CALCULATE IF TRIGLYCERIDE OVER 400 mg/dL 0 - 40 mg/dL   LDL Cholesterol UNABLE TO CALCULATE IF TRIGLYCERIDE OVER 400 mg/dL 0 - 99  mg/dL    Comment:        Total Cholesterol/HDL:CHD Risk Coronary Heart Disease Risk Table                     Men   Women  1/2 Average Risk   3.4   3.3  Average Risk       5.0   4.4  2 X Average Risk   9.6   7.1  3 X Average Risk  23.4   11.0        Use the calculated Patient Ratio above and the CHD Risk Table to determine the patient's CHD Risk.        ATP III CLASSIFICATION (LDL):  <100     mg/dL   Optimal  100-129  mg/dL   Near or Above                    Optimal  130-159  mg/dL   Borderline  160-189  mg/dL   High  >190     mg/dL   Very High Performed at [place] [address], [place], [place] [address]    Ct Angio Head W Or Wo Contrast  Result Date: 10/07/2017 CLINICAL DATA:  42 y/o M; felt a pop in the neck followed by head and neck pain. EXAM: CT ANGIOGRAPHY HEAD AND NECK TECHNIQUE: Multidetector CT imaging of the head and neck was performed using the standard protocol during bolus administration of intravenous contrast. Multiplanar CT image reconstructions and MIPs were obtained to evaluate the vascular anatomy. Carotid stenosis measurements (when applicable) are obtained utilizing NASCET criteria, using the distal internal carotid diameter as the denominator. CONTRAST:  175m ISOVUE-370 IOPAMIDOL (ISOVUE-370) INJECTION 76% COMPARISON:  None. FINDINGS: CT HEAD FINDINGS Brain: No evidence of acute infarction, hemorrhage, hydrocephalus, extra-axial collection or mass lesion/mass effect. Vascular: As below. Skull: Normal. Negative for fracture or focal lesion. Sinuses: Small mucous retention cysts within the maxillary sinuses and mild mucosal thickening of frontal sinuses and ethmoid air cells. Orbits: No acute finding. Review of the MIP images confirms the above findings CTA NECK FINDINGS Aortic arch: Standard branching. Imaged portion shows no evidence of aneurysm or dissection. No significant stenosis of  the major arch vessel origins. Right carotid system: No evidence of  dissection, stenosis (50% or greater) or occlusion. Mild stenotic mixed plaque of the right carotid bifurcation. Left carotid system: No evidence of dissection, stenosis (50% or greater) or occlusion. Vertebral arteries: Widely patent right vertebral artery. The left vertebral artery V1 segment is occluded and the V2-V4 segments are intermittently occluded and irregular with multiple segments of stenosis. Skeleton: Moderate cervical spondylosis with multilevel disc and facet degenerative changes. Multifactorial at least moderate bony canal stenosis at the C5-6 and C6-7 levels. Other neck: Negative. Upper chest: Negative. Review of the MIP images confirms the above findings CTA HEAD FINDINGS Anterior circulation: Predominantly fibrofatty plaque of the left cavernous and paraclinoid internal carotid artery with mild less than 50% cavernous stenosis (series 11, image 109). No additional segment of significant stenosis, occlusion, aneurysm, or vascular malformation. Posterior circulation: No significant stenosis, proximal occlusion, aneurysm, or vascular malformation. Venous sinuses: As permitted by contrast timing, patent. Anatomic variants: Large right A1 and anterior communicating artery with diminutive left A1, normal variant. Fenestrated anterior communicating artery. Bilateral fetal PCA. Delayed phase: No abnormal intracranial enhancement. Review of the MIP images confirms the above findings IMPRESSION: 1. 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 There is mixed plaque of the vessel favoring atherosclerotic disease, less likely dissection. Widely patent dominant right vertebral artery. 2. Patent bilateral carotid arteries and intracranial circulation. No large vessel occlusion, aneurysm, or significant stenosis by NASCET criteria. 3. Spondylosis of the cervical spine with multifactorial C5-6 and C6-7 at least moderate canal  stenosis. 4. Negative noncontrast CT of the head. No abnormal enhancement after administration of contrast. Electronically Signed   By: Kristine Garbe [name]   On: 10/07/2017 02:32   Ct Angio Neck W And/or Wo Contrast  Result Date: 10/07/2017 CLINICAL DATA:  42 y/o M; felt a pop in the neck followed by head and neck pain. EXAM: CT ANGIOGRAPHY HEAD AND NECK TECHNIQUE: Multidetector CT imaging of the head and neck was performed using the standard protocol during bolus administration of intravenous contrast. Multiplanar CT image reconstructions and MIPs were obtained to evaluate the vascular anatomy. Carotid stenosis measurements (when applicable) are obtained utilizing NASCET criteria, using the distal internal carotid diameter as the denominator. CONTRAST:  120m ISOVUE-370 IOPAMIDOL (ISOVUE-370) INJECTION 76% COMPARISON:  None. FINDINGS: CT HEAD FINDINGS Brain: No evidence of acute infarction, hemorrhage, hydrocephalus, extra-axial collection or mass lesion/mass effect. Vascular: As below. Skull: Normal. Negative for fracture or focal lesion. Sinuses: Small mucous retention cysts within the maxillary sinuses and mild mucosal thickening of frontal sinuses and ethmoid air cells. Orbits: No acute finding. Review of the MIP images confirms the above findings CTA NECK FINDINGS Aortic arch: Standard branching. Imaged portion shows no evidence of aneurysm or dissection. No significant stenosis of the major arch vessel origins. Right carotid system: No evidence of dissection, stenosis (50% or greater) or occlusion. Mild stenotic mixed plaque of the right carotid bifurcation. Left carotid system: No evidence of dissection, stenosis (50% or greater) or occlusion. Vertebral arteries: Widely patent right vertebral artery. The left vertebral artery V1 segment is occluded and the V2-V4 segments are intermittently occluded and irregular with multiple segments of stenosis. Skeleton: Moderate cervical spondylosis with  multilevel disc and facet degenerative changes. Multifactorial at least moderate bony canal stenosis at the C5-6 and C6-7 levels. Other neck: Negative. Upper chest: Negative. Review of the MIP images confirms the above findings  CTA HEAD FINDINGS Anterior circulation: Predominantly fibrofatty plaque of the left cavernous and paraclinoid internal carotid artery with mild less than 50% cavernous stenosis (series 11, image 109). No additional segment of significant stenosis, occlusion, aneurysm, or vascular malformation. Posterior circulation: No significant stenosis, proximal occlusion, aneurysm, or vascular malformation. Venous sinuses: As permitted by contrast timing, patent. Anatomic variants: Large right A1 and anterior communicating artery with diminutive left A1, normal variant. Fenestrated anterior communicating artery. Bilateral fetal PCA. Delayed phase: No abnormal intracranial enhancement. Review of the MIP images confirms the above findings IMPRESSION: 1. 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 There is mixed plaque of the vessel favoring atherosclerotic disease, less likely dissection. Widely patent dominant right vertebral artery. 2. Patent bilateral carotid arteries and intracranial circulation. No large vessel occlusion, aneurysm, or significant stenosis by NASCET criteria. 3. Spondylosis of the cervical spine with multifactorial C5-6 and C6-7 at least moderate canal stenosis. 4. Negative noncontrast CT of the head. No abnormal enhancement after administration of contrast. Electronically Signed   By: Kristine Garbe [name]   On: 10/07/2017 02:32   Mr Brain Wo Contrast  Result Date: 10/07/2017 CLINICAL DATA:  Acute presentation with headache and neck pain. Disorientation. Gait disturbance. EXAM: MRI HEAD WITHOUT CONTRAST TECHNIQUE: Multiplanar, multiecho pulse sequences of the brain and surrounding  structures were obtained without intravenous contrast. COMPARISON:  CT studies earlier same day FINDINGS: Brain: diffusion imaging does not show any acute or subacute infarction. Brainstem and cerebellum are normal. Cerebral hemispheres are normal. Few punctate foci of T2 and FLAIR signal in the frontal white matter are not significant. No mass lesion, hemorrhage, hydrocephalus or extra-axial collection. No pituitary mass. Vascular: Major vessels at the base of the brain show flow. Skull and upper cervical spine: Negative Sinuses/Orbits: Mild mucosal inflammation of the paranasal sinuses. No advanced sinus inflammation. Orbits negative. Other: None IMPRESSION: Normal MRI of the brain. Few punctate cerebral hemispheric white matter foci likely significant. Electronically Signed   By: Nelson Chimes [name]   On: 10/07/2017 08:07    Pending Labs Unresulted Labs (From admission, onward)   None      Vitals/Pain Today's Vitals   10/07/17 0924 10/07/17 1000 10/07/17 1009 10/07/17 1021  BP: (!) 168/106 (!) 170/119    Pulse: 98 92    Resp: 16 16    Temp:   98.4 F (36.9 C)   TempSrc:      SpO2: 100% 97%    Weight:      Height:      PainSc:    0-No pain    Isolation Precautions No active isolations  Medications Medications  iopamidol (ISOVUE-370) 76 % injection 100 mL (100 mLs Intravenous Contrast Given 10/07/17 0152)  LORazepam (ATIVAN) injection 1 mg (1 mg Intravenous Given 10/07/17 0648)    Mobility walks

## 2017-10-07 NOTE — ED Notes (Signed)
Pt taken to MRI with continuous pulse ox.

## 2017-10-07 NOTE — ED Notes (Signed)
Patient transported to MRI 

## 2017-10-07 NOTE — Plan of Care (Signed)
  Problem: Education: Goal: Knowledge of General Education information will improve Description Including pain rating scale, medication(s)/side effects and non-pharmacologic comfort measures Outcome: Completed/Met   Problem: Education: Goal: Knowledge of disease or condition will improve Outcome: Completed/Met Goal: Knowledge of secondary prevention will improve Outcome: Completed/Met Goal: Knowledge of patient specific risk factors addressed and post discharge goals established will improve Outcome: Completed/Met   Problem: Education: Goal: Knowledge of disease or condition will improve Outcome: Completed/Met Goal: Knowledge of secondary prevention will improve Outcome: Completed/Met Goal: Knowledge of patient specific risk factors addressed and post discharge goals established will improve Outcome: Completed/Met Goal: Individualized Educational Video(s) Outcome: Completed/Met   Problem: Coping: Goal: Will verbalize positive feelings about self Outcome: Completed/Met Goal: Will identify appropriate support needs Outcome: Completed/Met   Problem: Self-Care: Goal: Verbalization of feelings and concerns over difficulty with self-care will improve Outcome: Completed/Met Goal: Ability to communicate needs accurately will improve Outcome: Completed/Met   Problem: Nutrition: Goal: Dietary intake will improve Outcome: Completed/Met   Problem: Education: Goal: Knowledge of disease or condition will improve Outcome: Completed/Met Goal: Knowledge of secondary prevention will improve Outcome: Completed/Met Goal: Knowledge of patient specific risk factors addressed and post discharge goals established will improve Outcome: Completed/Met Goal: Individualized Educational Video(s) Outcome: Completed/Met   Problem: Coping: Goal: Will verbalize positive feelings about self Outcome: Completed/Met Goal: Will identify appropriate support needs Outcome: Completed/Met   Problem:  Health Behavior/Discharge Planning: Goal: Ability to manage health-related needs will improve Outcome: Completed/Met   Problem: Self-Care: Goal: Ability to participate in self-care as condition permits will improve Outcome: Completed/Met Goal: Verbalization of feelings and concerns over difficulty with self-care will improve Outcome: Completed/Met Goal: Ability to communicate needs accurately will improve Outcome: Completed/Met   Problem: Ischemic Stroke/TIA Tissue Perfusion: Goal: Complications of ischemic stroke/TIA will be minimized Outcome: Completed/Met

## 2017-10-07 NOTE — ED Notes (Signed)
Received patient. Pt ambulatory with steady gait. Alert and oriented x4. Skin warm and dry. Respirations equal and unlabored. s1 and s2 heart sounds audible. Pt in room putting on a gown.

## 2017-10-07 NOTE — Evaluation (Signed)
Physical Therapy Evaluation Patient Details Name: [name] MRN: [id number] DOB: 12/02/1975 Today's Date: 10/07/2017   History of Present Illness  Patient is a 42 y/o male presenting to the ED with priamry complaints of headache with associated nausea and imbalance. CTA head/neck was performed showing small calibur left vertebral artery occlusion. Normal MRI of the brain. PMH significant for T2DM, HTN, tobacco use disorder, h/o triglyceride induced pancreatitis, anxiety and chronic hip pain.  Clinical Impression  [name] admitted with the above listed diagnosis. Patient reports that prior to admission he was independent with all aspects of mobility. Today only requiring general supervision for safety and balance. Patient feels as if he is at baseline level of functioning. No further acute PT needs identified and all education completed. PT to sing off. Thanks for the referral!     Follow Up Recommendations No PT follow up    Equipment Recommendations  None recommended by PT    Recommendations for Other Services       Precautions / Restrictions Precautions Precautions: Fall Restrictions Weight Bearing Restrictions: No      Mobility  Bed Mobility Overal bed mobility: Independent                Transfers Overall transfer level: Needs assistance Equipment used: None Transfers: Sit to/from Stand Sit to Stand: Supervision         General transfer comment: for general safety  Ambulation/Gait Ambulation/Gait assistance: Supervision Gait Distance (Feet): 200 Feet Assistive device: None Gait Pattern/deviations: Step-through pattern;Decreased stride length Gait velocity: decreased   General Gait Details: WNL - no LOB noted; supervision for general safety  Stairs Stairs: Yes Stairs assistance: Supervision Stair Management: No rails;Alternating pattern Number of Stairs: 3    Wheelchair Mobility    Modified Rankin (Stroke Patients Only)       Balance  Overall balance assessment: Mild deficits observed, not formally tested                                           Pertinent Vitals/Pain Pain Assessment: No/denies pain    Home Living Family/patient expects to be discharged to:: Private residence Living Arrangements: Children   Type of Home: House Home Access: Stairs to enter   Secretary/administratorntrance Stairs-Number of Steps: ("a couple") Home Layout: One level Home Equipment: None      Prior Function Level of Independence: Independent         Comments: works full time Engineer, maintenance"building bridges"     Hand Dominance        Extremity/Trunk Assessment        Lower Extremity Assessment Lower Extremity Assessment: Overall WFL for tasks assessed    Cervical / Trunk Assessment Cervical / Trunk Assessment: Normal  Communication   Communication: No difficulties  Cognition Arousal/Alertness: Awake/alert Behavior During Therapy: WFL for tasks assessed/performed Overall Cognitive Status: Within Functional Limits for tasks assessed                                        General Comments      Exercises     Assessment/Plan    PT Assessment Patent does not need any further PT services  PT Problem List         PT Treatment Interventions      PT Goals (Current  goals can be found in the Care Plan section)  Acute Rehab PT Goals Patient Stated Goal: return home PT Goal Formulation: With patient Time For Goal Achievement: 10/10/17 Potential to Achieve Goals: Good    Frequency     Barriers to discharge        Co-evaluation               AM-PAC PT "6 Clicks" Daily Activity  Outcome Measure Difficulty turning over in bed (including adjusting bedclothes, sheets and blankets)?: None Difficulty moving from lying on back to sitting on the side of the bed? : None Difficulty sitting down on and standing up from a chair with arms (e.g., wheelchair, bedside commode, etc,.)?: A Little Help needed  moving to and from a bed to chair (including a wheelchair)?: A Little Help needed walking in hospital room?: A Little Help needed climbing 3-5 steps with a railing? : A Little 6 Click Score: 20    End of Session Equipment Utilized During Treatment: Gait belt Activity Tolerance: Patient tolerated treatment well Patient left: in bed;with call bell/phone within reach Nurse Communication: Mobility status PT Visit Diagnosis: Unsteadiness on feet (R26.81);Other abnormalities of gait and mobility (R26.89)    Time: 1206-1222 PT Time Calculation (min) (ACUTE ONLY): 16 min   Charges:   PT Evaluation $PT Eval Moderate Complexity: 1 Mod          Kipp Laurence, PT, DPT 10/07/17 1:37 PM Pager: [phone]

## 2017-10-07 NOTE — Progress Notes (Addendum)
Called to request that the nurse inform the patient to continue DOA therapy x 3 weeks (plavix + ASA) and then to continue ASA until he sees his neurologist. The patient was walking down the hallway following discharge but the nurse stated that she was relaying the information to the patient and would make a note of this on his paperwork prior to him leaving the floor.   Lanelle BalLawrence Geselle Cardosa, MD Internal Medicine

## 2017-10-07 NOTE — Progress Notes (Addendum)
Dr. Cyndie ChimeGranfortuna evaluated patient and in agreement with discharge today. He already had complete stroke workup with no other source found other than vertebral artery stenosis. I will write work letter for 1 wk. He will be discharge on asa 81mg  daily, plavix 75mg  daily x3 months then one agent alone; will follow up with neuro. Discharged on home insulin, increased metformin to 1000mg  BID, and increased dose of amlodipine to 10mg  daily. Will benefit from change in insulin to lantus/novolog but will defer to PCP. Attempted making appt but RN was not able to answer the phone at that time; I asked her to call the patient so he can be seen by Dr. Jarold MottoPatterson within 1wk.   Nyra MarketGorica Guilianna Mckoy, MD IMTS - PGY3

## 2017-10-07 NOTE — ED Notes (Signed)
[place] given to [place], [place].

## 2017-10-07 NOTE — Progress Notes (Signed)
Patient for discharge home this evenng.  IV and telemetry discontinued.  AVS discussed.  Patient comfortable upon discharge.

## 2017-10-07 NOTE — H&P (Addendum)
Date: 10/07/2017               Patient Name:  [name] MRN: [id number]  DOB: April 11, 1975 Age / Sex: 42 y.o., male   PCP: Jarome Matin, MD         Medical Service: Internal Medicine Teaching Service         Attending Physician: Dr. Levert Feinstein, MD    First Contact: Dr. Gwyneth Revels Pager: [phone]  Second Contact: Dr. Obie Dredge Pager: [phone]       After Hours (After 5p/  First Contact Pager: [phone]  weekends / holidays): Second Contact Pager: [phone]   Chief Complaint: headach  History of Present Illness:  Mr. Kozlov is a 42yo male with PMH of T2DM, HTN, tobacco use disorder, h/o triglyceride induced pancreatitis, anxiety and chronic hip pain presenting to [facility] initially last night after sudden onset neck and head pain.  Patient states he was in his usual state of health when he was reaching for his glass and had sudden sensation of a "pop" in his left posterior neck which he described as if a rubber band had snapped; this was followed by radiation of the pain for the left post and superior side of his head, nausea, and imbalance. He was not able to walk to his car due to his instability and stated he would veer to the right. He has never had any similar occurrence before; he denies fevers, chills, chest pain, shortness of breath, rash, vomiting, vision or hearing changes, focal numbness or tingling, focal weakness, illicit drug use, recent EtOH use.  Patient initially presented to [facility] where a CTA head/neck was performed showing small calibur left vertebral artery occlusion; neurology was consulted and they recommended transfer to [facility] for an MRI. On arrival to [facility] he was afebrile, hypertensive to 170s (patient endorses running out of amlodipine about 3 days ago); CBC was remarkable only for thrombocytopenia to 90s which is stable from previous.   By time of my evaluation he reports improvement in his headache and neck pain, resolution of nausea, and he was able to walk without  issues.  Meds:  Current Meds  Medication Sig  . amLODipine (NORVASC) 5 MG tablet Take 5 mg by mouth daily.  . diphenhydrAMINE HCl (ZZZQUIL) 50 MG/30ML LIQD Take 30 mLs by mouth daily as needed (sleep).  . gabapentin (NEURONTIN) 300 MG capsule Take 300 mg by mouth 2 (two) times daily.  Marland Kitchen ibuprofen (ADVIL,MOTRIN) 200 MG tablet Take 800 mg by mouth daily as needed for moderate pain.  Marland Kitchen insulin NPH-regular Human (NOVOLIN 70/30) (70-30) 100 UNIT/ML injection Inject 40-50 Units into the skin See admin instructions. 50 units in the am and 40 units at night  . metFORMIN (GLUCOPHAGE-XR) 500 MG 24 hr tablet Take 500 mg by mouth daily.  . pioglitazone (ACTOS) 15 MG tablet Take 15 mg by mouth daily.  . propranolol (INDERAL) 10 MG tablet Take 10 mg by mouth daily.  . Tetrahydrozoline HCl (VISINE OP) Apply 2 drops to eye daily as needed (red eye).   Allergies: Allergies as of 10/06/2017  . (No Known Allergies)   Past Medical History:  Diagnosis Date  . Diabetes mellitus   . Hypercholesteremia   . Hypertension   . MRSA (methicillin resistant staph aureus) culture positive   . Obesity   . Obesity   . Pancreatitis   . Umbilical hernia     Family History: \Patient endorses a GF who had a stroke at age of  6, otherwise denies FHx of early strokes, MIs, bleeding or clotting disorders  Social History: Patient endorses smoking 1-2 packs per days since age of 53; he endorses occasional EtOH use (last use 2 days ago, he had 8 beers; prior to that it had been 6mos); he denies current illicit drug use  Review of Systems: A complete ROS was negative except as per HPI.   Physical Exam: Blood pressure (!) 159/117, pulse 89, temperature 98.2 F (36.8 C), temperature source Oral, resp. rate 16, height 5\' 6"  (1.676 m), weight 260 lb (117.9 kg), SpO2 97 %. GENERAL- alert, co-operative, appears as stated age, not in any distress. HEENT- Atraumatic, normocephalic, PERRL, EOMI, oral mucosa appears  moist CARDIAC- RRR, no murmurs, rubs or gallops. RESP- Moving equal volumes of air, and clear to auscultation bilaterally, no wheezes or crackles. ABDOMEN- Soft, nontender, bowel sounds present. NEURO- CN 2-12 intact, strength and sensation intact throughout, no dysmetria, normal ankle to shin bilaterally, gait normal for patient (antalgic 2/2 heel spur) EXTREMITIES- pulse 2+, symmetric, no pedal edema. SKIN- Warm, dry, no rash or lesion. PSYCH- Normal mood and affect, appropriate thought content and speech.  CTA head/neck 10/07/2017: 1. 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 There is mixed plaque of the vessel favoring atherosclerotic disease, less likely dissection. Widely patent dominant right vertebral artery. 2. Patent bilateral carotid arteries and intracranial circulation. No large vessel occlusion, aneurysm, or significant stenosis by NASCET criteria. 3. Spondylosis of the cervical spine with multifactorial C5-6 and C6-7 at least moderate canal stenosis. 4. Negative noncontrast CT of the head. No abnormal enhancement after administration of contrast.  MRI brain wo contrast 10/07/2017: Normal MRI of the brain. Few punctate cerebral hemispheric white matter foci likely significant.  Assessment & Plan by Problem: Active Problems:   TIA (transient ischemic attack)  TIA: Patient with transient gait instability and headache in setting of left vertebral artery occlusion; MRI is negative; CTA noted likely atherosclerotic stenosis of VA. His symptoms have since resolved. He is young, however has significant risk factors for ischemic stroke with tobacco use, HTN, T2DM, HLD. A1c today is >9; lipids are elevated and he is hypertensive from running out of BP meds over the weekend.  --neuro on board; appreciate recs --asa 81 + plavix for 3 months then one agent alone --started lipitor 40mg  daily --f/u  Echo --f/u PT/OT recs --increase to amlodipine 10mg  daily at discharge with close f/u with PCP for further HTN management  Thrombocytopenia: Chronic; 98 today.  --will review smear with Dr. Cyndie Chime today to ensure accuracy  T2DM: A1c>9, uncontrolled. Currently on NPH/reg insulin 70/30 mix 50 qAM and 40 qPM, metformin 500mg  BID, and pioglitazone 15mg  daily. Had allergic reaction when started on tresiba and novolog (and ARB and HCTZ).  --while inpatient will switch to lantus 40 units qhs, 4 units novolog TID wc +SSI-M --consider discharging him on similar combination to help better control his DM --at discharge would consider increasing Metformin to 1000mg  BID  HTN: Currently on amlodipine 5mg  daily; apparently about 2 months ago he was started on an ARB and HCTZ (along with [place] and novolog) and had allergic reaction so all 4 were held. Would hold on trial of ace/arb or thiazide for right now. --on discharge increase amlodipine to 10mg  daily with close PCP follow up   Diet: CM IVF: none VTE ppx: lovenox Code: FULL - discussed on admission  Dispo: Admit patient to Inpatient  with expected length of stay greater than 2 midnights.  Signed: Nyra MarketSvalina, Gerard Cantara, MD 10/07/2017, 11:30 AM  [facility] - PGY3 Pager [phone]

## 2017-10-07 NOTE — Progress Notes (Signed)
  Echocardiogram 2D Echocardiogram has been performed.  Roosvelt MaserLane, Marice Angelino F 10/07/2017, 2:49 PM

## 2017-10-07 NOTE — Evaluation (Signed)
Occupational Therapy Evaluation Patient Details Name: [name] MRN: [id number] DOB: 1976-01-14 Today's Date: 10/07/2017    History of Present Illness Patient is a 42 y/o male presenting to the ED with priamry complaints of headache with associated nausea and imbalance. CTA head/neck was performed showing small calibur left vertebral artery occlusion. Normal MRI of the brain. PMH significant for T2DM, HTN, tobacco use disorder, h/o triglyceride induced pancreatitis, anxiety and chronic hip pain.   Clinical Impression   This 42 y/o male presents with the above. At baseline pt is independent with ADLs, iADLs and functional mobility. Pt demonstrating functional mobility and ADLs this session including standing grooming, LB ADLs, and tub transfer without AD and overall supervision, no overt LOB noted throughout. Pt appearing very close to baseline functioning regarding ADL and mobility completion and pt in agreement. Reports he has available family support at time of discharge if needed. Education provided and questions answered throughout. Feel pt is safe to return home from OT standpoint once medically ready. No further acute OT needs identified at this time. Will sign off.     Follow Up Recommendations  No OT follow up;Supervision - Intermittent    Equipment Recommendations  None recommended by OT           Precautions / Restrictions Precautions Precautions: Fall Restrictions Weight Bearing Restrictions: No      Mobility Bed Mobility Overal bed mobility: Independent                Transfers Overall transfer level: Needs assistance Equipment used: None Transfers: Sit to/from Stand Sit to Stand: Supervision         General transfer comment: for general safety    Balance Overall balance assessment: Mild deficits observed, not formally tested                                         ADL either performed or assessed with clinical judgement   ADL  Overall ADL's : At baseline                                       General ADL Comments: pt demonstrating functional mobility in room and hallway, tub transfer, standing grooming ADLs, and demonstrating LB ADLs without AD and supervision throughout, no overt LOB noted. pt reports feeling at his baseline regarding ADLs and mobility. Educated on s/s of stroke including FAST      Vision Baseline Vision/History: Wears glasses Wears Glasses: At all times("supposed to at all times", mostly for distance ) Patient Visual Report: No change from baseline Vision Assessment?: Yes;No apparent visual deficits Eye Alignment: Within Functional Limits Ocular Range of Motion: Within Functional Limits Alignment/Gaze Preference: Within Defined Limits Tracking/Visual Pursuits: Able to track stimulus in all quads without difficulty Visual Fields: No apparent deficits     Perception     Praxis      Pertinent Vitals/Pain Pain Assessment: No/denies pain     Hand Dominance Right   Extremity/Trunk Assessment Upper Extremity Assessment Upper Extremity Assessment: Overall WFL for tasks assessed   Lower Extremity Assessment Lower Extremity Assessment: Defer to PT evaluation   Cervical / Trunk Assessment Cervical / Trunk Assessment: Normal   Communication Communication Communication: No difficulties   Cognition Arousal/Alertness: Awake/alert Behavior During Therapy: WFL for tasks assessed/performed Overall Cognitive Status: Within  Functional Limits for tasks assessed                                     General Comments       Exercises     Shoulder Instructions      Home Living Family/patient expects to be discharged to:: Private residence Living Arrangements: Children   Type of Home: House Home Access: Stairs to enter Secretary/administratorntrance Stairs-Number of Steps: ("a couple")   Home Layout: One level     Bathroom Shower/Tub: Chief Strategy OfficerTub/shower unit   Bathroom Toilet:  Standard     Home Equipment: None          Prior Functioning/Environment Level of Independence: Independent        Comments: works full time Research scientist (life sciences)"building bridges", works nights         OT Problem List: Impaired balance (sitting and/or standing);Decreased activity tolerance      OT Treatment/Interventions:      OT Goals(Current goals can be found in the care plan section) Acute Rehab OT Goals Patient Stated Goal: return home, hopeful for d/c today  OT Goal Formulation: All assessment and education complete, DC therapy  OT Frequency:     Barriers to D/C:            Co-evaluation              AM-PAC PT "6 Clicks" Daily Activity     Outcome Measure Help from another person eating meals?: None Help from another person taking care of personal grooming?: None Help from another person toileting, which includes using toliet, bedpan, or urinal?: None Help from another person bathing (including washing, rinsing, drying)?: None Help from another person to put on and taking off regular upper body clothing?: None Help from another person to put on and taking off regular lower body clothing?: None 6 Click Score: 24   End of Session Equipment Utilized During Treatment: Gait belt Nurse Communication: Mobility status  Activity Tolerance: Patient tolerated treatment well Patient left: with call bell/phone within reach;with family/visitor present;Other (comment)(sitting EOB )  OT Visit Diagnosis: Other symptoms and signs involving the nervous system (Z61.096(R29.898)                Time: [id number] OT Time Calculation (min): 15 min Charges:  OT General Charges $OT Visit: 1 Visit OT Evaluation $OT Eval Low Complexity: 1 Low  Marcy SirenBreanna Shawna Kiener, OT Pager [phone] 10/07/2017  Jason PennerBreanna L Ival [name] 10/07/2017, 4:09 PM

## 2017-10-07 NOTE — Consult Note (Addendum)
Neurology Consultation  Reason for Consult: Headache with possible occlusion Referring Physician: [place]    History is obtained from: Patient  HPI: [name] is a 42 y.o. male with history of diabetes, hypercholesterolemia, hypertension.  Patient states that he was eating dinner at 2000 hrs. on 10/06/2017, he reached for a glass to his right and then felt a snap at the region of the occipital prominence which was followed by a painful sensation that traveled up and around the ear in the parietal region of his head.  At that point he felt unsteady on his feet and as though he was not thinking right.  He got up to stand up and he noted that he had a hold onto objects because he was drifting left and right.  After a while his symptoms fully resolved.  She was transferred to [facility]     LKW: 2000 hrs. on 10/06/2017 tpa given?: no, resolved symptoms Premorbid modified Rankin scale (mRS): 0 NIH stroke scale 0   ROS: A 14 point ROS was performed and is negative except as noted in the HPI.  Past Medical History:  Diagnosis Date  . Diabetes mellitus   . Hypercholesteremia   . Hypertension   . MRSA (methicillin resistant staph aureus) culture positive   . Obesity   . Obesity   . Pancreatitis   . Umbilical hernia      Family History  Problem Relation Age of Onset  . Hypertension Mother   . Hypertension Father      Social History:   reports that he has been smoking.  He has never used smokeless tobacco. He reports that he does not drink alcohol or use drugs.  Medications  Current Facility-Administered Medications:  .   stroke: mapping our early stages of recovery book, , Does not apply, Once, Nyra Market, MD .  acetaminophen (TYLENOL) tablet 650 mg, 650 mg, Oral, Q4H PRN **OR** acetaminophen (TYLENOL) solution 650 mg, 650 mg, Per Tube, Q4H PRN **OR** acetaminophen (TYLENOL) suppository 650 mg, 650 mg, Rectal, Q4H PRN, Nyra Market, MD .  Melene Muller ON 10/08/2017]  aspirin EC tablet 81 mg, 81 mg, Oral, Daily, Svalina, Gorica, MD .  aspirin tablet 325 mg, 325 mg, Oral, Once, Nyra Market, MD .  atorvastatin (LIPITOR) tablet 40 mg, 40 mg, Oral, q1800, Svalina, Gorica, MD .  gabapentin (NEURONTIN) capsule 300 mg, 300 mg, Oral, BID, Svalina, Gorica, MD .  ibuprofen (ADVIL,MOTRIN) tablet 800 mg, 800 mg, Oral, Daily PRN, Samuella Cota, Gorica, MD .  insulin aspart (novoLOG) injection 0-15 Units, 0-15 Units, Subcutaneous, TID WC, Svalina, Gorica, MD .  insulin aspart (novoLOG) injection 4 Units, 4 Units, Subcutaneous, TID WC, Svalina, Gorica, MD .  insulin glargine (LANTUS) injection 40 Units, 40 Units, Subcutaneous, QHS, Svalina, Gorica, MD .  tetrahydrozoline 0.05 % ophthalmic solution 1 drop, 1 drop, Both Eyes, Daily PRN, Nyra Market, MD  Current Outpatient Medications:  .  amLODipine (NORVASC) 5 MG tablet, Take 5 mg by mouth daily., Disp: , Rfl: 12 .  diphenhydrAMINE HCl (ZZZQUIL) 50 MG/30ML LIQD, Take 30 mLs by mouth daily as needed (sleep)., Disp: , Rfl:  .  gabapentin (NEURONTIN) 300 MG capsule, Take 300 mg by mouth 2 (two) times daily., Disp: , Rfl:  .  ibuprofen (ADVIL,MOTRIN) 200 MG tablet, Take 800 mg by mouth daily as needed for moderate pain., Disp: , Rfl:  .  insulin NPH-regular Human (NOVOLIN 70/30) (70-30) 100 UNIT/ML injection, Inject 40-50 Units into the skin See admin instructions. 50  units in the am and 40 units at night, Disp: , Rfl:  .  metFORMIN (GLUCOPHAGE-XR) 500 MG 24 hr tablet, Take 500 mg by mouth daily., Disp: , Rfl: 12 .  pioglitazone (ACTOS) 15 MG tablet, Take 15 mg by mouth daily., Disp: , Rfl: 6 .  propranolol (INDERAL) 10 MG tablet, Take 10 mg by mouth daily., Disp: , Rfl: 3 .  Tetrahydrozoline HCl (VISINE OP), Apply 2 drops to eye daily as needed (red eye)., Disp: , Rfl:    Exam: Current vital signs: BP (!) 170/119   Pulse 92   Temp 98.4 F (36.9 C)   Resp 16   Ht 5\' 6"  (1.676 m)   Wt 117.9 kg (260 lb)   SpO2 97%    BMI 41.97 kg/m  Vital signs in last 24 hours: Temp:  [98.4 F (36.9 C)-98.5 F (36.9 C)] 98.4 F (36.9 C) (08/06 1009) Pulse Rate:  [70-98] 92 (08/06 1000) Resp:  [16-19] 16 (08/06 1000) BP: (163-177)/(92-119) 170/119 (08/06 1000) SpO2:  [95 %-100 %] 97 % (08/06 1000) Weight:  [117.9 kg (260 lb)] 117.9 kg (260 lb) (08/05 2139)  GENERAL: Awake, alert in NAD HEENT: - Normocephalic and atraumatic, dry mm, Ext: warm, well perfused, intact peripheral pulses,  NEURO:  Mental Status: AA&Ox3, speech is dysarthric however he and daughter state that this is baseline.  Naming, repetition, fluency, and comprehension intact. Cranial Nerves: PERRL 2 mm/brisk. EOMI, visual fields full, no facial asymmetry, facial sensation intact, hearing intact, tongue/uvula/soft palate midline,  Motor: 5/5 Tone: is normal and bulk is normal Sensation- Intact to light touch bilaterally Coordination: FTN intact bilaterally, no ataxia in BLE Gait-wide-based however he did not drift left or right.  He did have a short shuffled step.  No fenestration when turning.  Labs I have reviewed labs in epic and the results pertinent to this consultation are:   CBC    Component Value Date/Time   WBC 7.6 10/07/2017 0031   RBC 4.81 10/07/2017 0031   HGB 15.4 10/07/2017 0031   HCT 43.0 10/07/2017 0031   PLT 98 (L) 10/07/2017 0031   MCV 89.4 10/07/2017 0031   MCH 32.0 10/07/2017 0031   MCHC 35.8 10/07/2017 0031   RDW 12.9 10/07/2017 0031   LYMPHSABS 2.0 10/07/2017 0031   MONOABS 0.3 10/07/2017 0031   EOSABS 0.1 10/07/2017 0031   BASOSABS 0.0 10/07/2017 0031    CMP     Component Value Date/Time   NA 140 10/07/2017 0031   K 4.3 10/07/2017 0031   CL 105 10/07/2017 0031   CO2 26 10/07/2017 0031   GLUCOSE 278 (H) 10/07/2017 0031   BUN 13 10/07/2017 0031   CREATININE 0.90 10/07/2017 0031   CALCIUM 8.5 (L) 10/07/2017 0031   PROT 6.5 10/28/2016 0305   ALBUMIN 3.6 10/28/2016 0305   AST 17 10/28/2016 0305   ALT  14 (L) 10/28/2016 0305   ALKPHOS 73 10/28/2016 0305   BILITOT 0.7 10/28/2016 0305   GFRNONAA >60 10/07/2017 0031   GFRAA >60 10/07/2017 0031    Lipid Panel     Component Value Date/Time   CHOL 254 (H) 10/07/2017 0921   TRIG 775 (H) 10/07/2017 0921   HDL 36 (L) 10/07/2017 0921   CHOLHDL 7.1 10/07/2017 0921   VLDL UNABLE TO CALCULATE IF TRIGLYCERIDE OVER 400 mg/dL 16/12/9602 5409   LDLCALC UNABLE TO CALCULATE IF TRIGLYCERIDE OVER 400 mg/dL 81/19/1478 2956     Imaging I have reviewed the images obtained:  CTA Head  and neck- IMPRESSION: 1. 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 There is mixed plaque of the vessel favoring atherosclerotic disease, less likely dissection. Widely patent dominant right vertebral artery. 2. Patent bilateral carotid arteries and intracranial circulation. No large vessel occlusion, aneurysm, or significant stenosis by NASCET criteria. 3. Spondylosis of the cervical spine with multifactorial C5-6 and C6-7 at least moderate canal stenosis. 4. Negative noncontrast CT of the head. No abnormal enhancement after administration of contrast.   MRI examination of the brain-- IMPRESSION: Normal MRI of the brain. Few punctate cerebral hemispheric white matter foci likely significant.   Assessment: TIA most likely secondary to left vertebral occlusion   Recommendations: -Echocardiogram -A1c -Fasting lipid panel - Dual antiplatelets for 3 weeks (Plavix and aspirin) followed by aspirin daily (already has had initial load of 300 mg Plavix) .   @SIGNATUREAA @

## 2017-10-07 NOTE — ED Provider Notes (Signed)
[facility] to get MRI to further work-up sudden onset headache, disorientation, dizziness, and gait problems.  CT scan at the other hospital revealed concern for a vertebral artery occlusion.  Neurology recommended patient be transferred to this facility for MRI.  On my arrival, patient is in the MRI scanner.  Anticipate speaking with neurology after MRI results of returned and patient has been reevaluated.  8:18 AM Patient is returned from MRI and his MRI results of finalized.  MRI shows no acute stroke but does show small  punctate cerebral hemispheric white matter foci.  When I assessed the patient again, he reports that his headache is improved with 4 out of 10.  He no longer is feeling nauseous.  Neurology will be called for their evaluation and recommendations.   8:51 AM Spoke with neurology and they request he be admitted to hospital under internal medicine service for further evaluation of his symptoms and possible TIA.  They recommended a headache cocktail to help with his headache.  Compazine and Benadryl be ordered.  They are coming to see the patient.  Pt will be admitted to internal medicine service for further workup and management.      Clinical Impression: 1. Occlusion of left vertebral artery   2. Neck pain   3. TIA (transient ischemic attack)     Disposition: Admit  This note was prepared with assistance of Dragon voice recognition software. Occasional wrong-word or sound-a-like substitutions may have occurred due to the inherent limitations of voice recognition software.        Sharad Vaneaton, Canary Brimhristopher J, MD 10/07/17 [phone]

## 2017-10-07 NOTE — Discharge Summary (Signed)
Name: [name] MRN: [id number] DOB: 07-20-1975 42 y.o. PCP: Jarome Matin, MD  Date of Admission: 10/06/2017  9:45 PM Date of Discharge: 10/07/2017 Attending Physician: Levert Feinstein, MD  Discharge Diagnosis: 1. TIA 2. HTN 3. HLD 4. Thrombocytopenia  Discharge Medications: Allergies as of 10/07/2017   No Known Allergies     Medication List    STOP taking these medications   ibuprofen 200 MG tablet Commonly known as:  ADVIL,MOTRIN   ZZZQUIL 50 MG/30ML Liqd Generic drug:  diphenhydrAMINE HCl     TAKE these medications   amLODipine 10 MG tablet Commonly known as:  NORVASC Take 1 tablet (10 mg total) by mouth daily. What changed:    medication strength  how much to take   aspirin 81 MG EC tablet Take 1 tablet (81 mg total) by mouth daily. Start taking on:  10/08/2017   atorvastatin 40 MG tablet Commonly known as:  LIPITOR Take 1 tablet (40 mg total) by mouth daily at 6 PM.   clopidogrel 75 MG tablet Commonly known as:  PLAVIX Take 1 tablet (75 mg total) by mouth daily. Start taking on:  10/08/2017   gabapentin 300 MG capsule Commonly known as:  NEURONTIN Take 300 mg by mouth 2 (two) times daily.   insulin NPH-regular Human (70-30) 100 UNIT/ML injection Commonly known as:  NOVOLIN 70/30 Inject 40-50 Units into the skin See admin instructions. 50 units in the am and 40 units at night   metFORMIN 500 MG 24 hr tablet Commonly known as:  GLUCOPHAGE-XR Take 2 tablets (1,000 mg total) by mouth 2 (two) times daily with a meal. What changed:    how much to take  when to take this   nicotine 21 mg/24hr patch Commonly known as:  NICODERM CQ - dosed in mg/24 hours Place 1 patch (21 mg total) onto the skin daily. Start taking on:  10/08/2017   pioglitazone 15 MG tablet Commonly known as:  ACTOS Take 15 mg by mouth daily.   propranolol 10 MG tablet Commonly known as:  INDERAL Take 10 mg by mouth daily.   VISINE OP Apply 2 drops to eye daily as  needed (red eye).       Disposition and follow-up:   Mr.[name] was discharged from [facility] in Stable condition.  At the hospital follow up visit please address:  1.   TIA: Ensure patient is taking aspirin 81mg  daily, plavix 75mg  daily and atorvastatin 40mg  daily Per neuro recs, continue dual antiplatelets for 3 months then single agent Referral was placed for outpt neurology follow up; please ensure he gets an appointment to be seen in ~6wks  HTN: Discharged on increased dose of amlodipine to 10mg  daily Please assess need for additional agents  T2DM: A1c was >9 on admission Plan was to change him to long acting and mealtime insulin but patient declined for now; please consider making this change for better glycemic control Metformin was increased to 1000mg  BID Pioglitazone was continued at 15mg  daily  2.  Labs / imaging needed at time of follow-up: n/a  3.  Pending labs/ test needing follow-up: Direct LDL count; if elevated >190 consider repeating after 6-8 weeks of statin therapy; Screening HIV Ab.  Follow-up Appointments: Follow-up Information    Jarome Matin, MD. Schedule an appointment as soon as possible for a visit in 1 week(s).   Specialty:  Internal Medicine Contact information: [address] [place] [address] [phone]  Hospital Course by problem list:  TIA: Patient presented initially to [facility] for acute onset of left sided neck pain with radiation to left scalp, nausea and gait instability. He was hypertensive on arrival and noted to be unsteady on his feet. 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 He was transferred to [facility] for brain MRI which did not reveal any acute infarct. By time of admission, his symptoms had resolved and he had no focal neuro deficits. He was evaluated by  neurology who recommended dual antiplatelets with aspirin 81mg  daily and plavix 75mg  daily for 3 months, then a single agent thereafter in addition to risk factor modification. Referral to outpatient neurology was placed on discharge; he should follow up with neurology in about 6 weeks.  HTN: Patient was hypertensive and noted to have run out of his amlodipine over the weekend. He states he was tried on additional agents in April, however developed an allergic reaction with urticaria and sensation of tongue swelling. The medications that had been started that day were [place], novolog aspart, hydrochlorothiazide, and telmisartan; all were held afterwards. It is difficult to state what drove the allergic reaction. On discharge his amlodipine was increased to 10mg  daily and he was urged to have close follow up with his PCP to evaluate whether an additional antihypertensive needs to be started.   T2DM: Patient on home regimen of metformin 500mg  BID, pioglitazone 15mg  daily and NPH/reg 70/30 insulin 50 units qAM and 40 units qPM. His A1c on admission was 9.4. The plan was to switch him to long-acting insulin and mealtime insulin, however patient wanted to defer this. During his short stay with [place] he did tolerate novolog aspart for mealtime coverage without allergic reaction which is reassuring that he may be ok taking it outpatient without issues. His metformin was increased to 1000mg  BID on discharge.   Tobacco use: Patient with significant smoking history of 3ppd since age of 88, has recently cut down to about 1-2 packs per day. He was urged to consider smoking cessation especially in the setting of an ischemic event. He was discharged with a prescription for nicotine patches.  Thrombocytopenia: Patient with chronic cytopenia for the past 7 years without s/sx of bleeding and maintained RBC and leukocyte counts. 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   Discharge Vitals:   BP (!) 182/105 (BP Location: Right Arm)   Pulse 75   Temp 98.3 F (36.8 C) (Oral)   Resp 16   Ht 5\' 6"  (1.676 m)   Wt 260 lb (117.9 kg)   SpO2 98%   BMI 41.97 kg/m   Pertinent Labs, Studies, and Procedures:  CBC    Component Value Date/Time   WBC 7.6 10/07/2017 0031   RBC 4.81 10/07/2017 0031   HGB 15.4 10/07/2017 0031   HCT 43.0 10/07/2017 0031   PLT 98 (L) 10/07/2017 0031   MCV 89.4 10/07/2017 0031   MCH 32.0 10/07/2017 0031   MCHC 35.8 10/07/2017 0031   RDW 12.9 10/07/2017 0031   LYMPHSABS 2.0 10/07/2017 0031   MONOABS 0.3 10/07/2017 0031   EOSABS 0.1 10/07/2017 0031   BASOSABS 0.0 10/07/2017 0031   BMET    Component Value Date/Time   NA 140 10/07/2017 0031  K 4.3 10/07/2017 0031   CL 105 10/07/2017 0031   CO2 26 10/07/2017 0031   GLUCOSE 278 (H) 10/07/2017 0031   BUN 13 10/07/2017 0031   CREATININE 0.90 10/07/2017 0031   CALCIUM 8.5 (L) 10/07/2017 0031   GFRNONAA >60 10/07/2017 0031   GFRAA >60 10/07/2017 0031   Hemoglobin A1c 10/07/2017: 8.4  Lipid Panel     Component Value Date/Time   CHOL 254 (H) 10/07/2017 0921   TRIG 775 (H) 10/07/2017 0921   HDL 36 (L) 10/07/2017 0921   CHOLHDL 7.1 10/07/2017 0921   VLDL UNABLE TO CALCULATE IF TRIGLYCERIDE OVER 400 mg/dL 16/12/9602 5409   LDLCALC UNABLE TO CALCULATE IF TRIGLYCERIDE OVER 400 mg/dL 81/19/1478 2956   CTA head/neck w/wo contrast 10/07/2017: 1. 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 There is mixed plaque of the vessel favoring atherosclerotic disease, less likely dissection. Widely patent dominant right vertebral artery. 2. Patent bilateral carotid arteries and intracranial circulation. No large vessel occlusion, aneurysm, or significant stenosis by NASCET criteria. 3. Spondylosis  of the cervical spine with multifactorial C5-6 and C6-7 at least moderate canal stenosis. 4. Negative noncontrast CT of the head. No abnormal enhancement after administration of contrast.  MRI brain wo contrast 10/07/2017: Normal MRI of the brain. Few punctate cerebral hemispheric white matter foci likely significant.  Echocardiogram 10/07/2017: - Left ventricle: The cavity size was normal. Wall thickness was   increased in a pattern of moderate LVH. Systolic function was   normal. The estimated ejection fraction was in the range of 60%   to 65%. Wall motion was normal; there were no regional wall   motion abnormalities. Doppler parameters are consistent with   abnormal left ventricular relaxation (grade 1 diastolic   dysfunction). - Aorta: Aortic root dimension: 43 mm (ED). - Ascending aorta: The ascending aorta was mildly dilated. - Left atrium: The atrium was mildly dilated.  Impressions:  - No cardiac source of emboli was indentified.  Discharge Instructions: Discharge Instructions    Ambulatory referral to Neurology   Complete by:  As directed    An appointment is requested in approximately: 6wks   Call MD for:  difficulty breathing, headache or visual disturbances   Complete by:  As directed    Call MD for:  extreme fatigue   Complete by:  As directed    Call MD for:  hives   Complete by:  As directed    Call MD for:  persistant dizziness or light-headedness   Complete by:  As directed    Call MD for:  persistant nausea and vomiting   Complete by:  As directed    Call MD for:  redness, tenderness, or signs of infection (pain, swelling, redness, odor or green/yellow discharge around incision site)   Complete by:  As directed    Call MD for:  severe uncontrolled pain   Complete by:  As directed    Call MD for:  temperature >100.4   Complete by:  As directed    Diet - low sodium heart healthy   Complete by:  As directed    Discharge instructions   Complete by:  As  directed    You were in the hospital for a TIA or transient stroke. You were seen by the neurologist as part of your evaluation.  Please start taking a -baby aspirin every day (81mg ) -Plavix 75mg  every day After three months your physician and  neurologist will likely stop one of these; do not stop these before discussing with your doctor. I sent in a referral for a neurology follow up; you should be seen in about 6 weeks  For your cholesterol: Start taking atorvastatin (lipitor) 40mg  once daily with dinner.  For your blood pressure: Increase your amlodipine to 10mg  daily (I sent in new script) Follow up with your doctor about adding any other medicines to help better control your blood pressure.  For your diabetes: Your A1c was above 9; it is uncontrolled. Increase your metformin to 1000mg  twice daily (that's two pills twice daily with meals) Continue the pioglitazone daily Continue your insulin regimen for right now, but I would encourage that you be tried on separate long acting and mealtime insulins again. Please make an appointment to be seen by your primary care provider within 1 week.   Increase activity slowly   Complete by:  As directed       Signed: Nyra MarketSvalina, Elisa Sorlie, MD 10/07/2017, 5:51 PM

## 2017-10-08 LAB — LDL CHOLESTEROL, DIRECT: Direct LDL: 81 mg/dL (ref 0–99)

## 2017-10-08 LAB — HIV ANTIBODY (ROUTINE TESTING W REFLEX): HIV SCREEN 4TH GENERATION: NONREACTIVE

## 2017-10-10 DIAGNOSIS — G450 Vertebro-basilar artery syndrome: Secondary | ICD-10-CM | POA: Insufficient documentation

## 2017-10-22 DIAGNOSIS — M722 Plantar fascial fibromatosis: Secondary | ICD-10-CM | POA: Insufficient documentation

## 2017-10-23 ENCOUNTER — Encounter: Payer: Self-pay | Admitting: Podiatry

## 2017-10-23 ENCOUNTER — Ambulatory Visit (INDEPENDENT_AMBULATORY_CARE_PROVIDER_SITE_OTHER): Payer: BLUE CROSS/BLUE SHIELD

## 2017-10-23 ENCOUNTER — Ambulatory Visit: Payer: BLUE CROSS/BLUE SHIELD | Admitting: Podiatry

## 2017-10-23 DIAGNOSIS — M216X9 Other acquired deformities of unspecified foot: Secondary | ICD-10-CM | POA: Diagnosis not present

## 2017-10-23 DIAGNOSIS — M7731 Calcaneal spur, right foot: Secondary | ICD-10-CM | POA: Diagnosis not present

## 2017-10-23 DIAGNOSIS — M7751 Other enthesopathy of right foot: Secondary | ICD-10-CM

## 2017-10-23 DIAGNOSIS — M779 Enthesopathy, unspecified: Secondary | ICD-10-CM | POA: Diagnosis not present

## 2017-10-23 DIAGNOSIS — M7752 Other enthesopathy of left foot: Secondary | ICD-10-CM

## 2017-10-23 DIAGNOSIS — M7732 Calcaneal spur, left foot: Secondary | ICD-10-CM

## 2017-10-23 DIAGNOSIS — M722 Plantar fascial fibromatosis: Secondary | ICD-10-CM | POA: Diagnosis not present

## 2017-10-23 DIAGNOSIS — M778 Other enthesopathies, not elsewhere classified: Secondary | ICD-10-CM

## 2017-10-31 NOTE — Progress Notes (Signed)
Subjective:  Patient ID: [name], male    DOB: [birth year],  MRN: [id number]  Chief Complaint  Patient presents with  . Foot Pain    bilateral arch and heel pain    42 y.o. male presents with the above complaint.  Reports pain to the both arches and heels.  Present for several months states that he has been taking gabapentin for this from his PCP but it does not help much.  Has pain in the outside the edge of the foot as well.  Endorses diabetes.  On his feet 60+ hours a week and steel toed boots.   Would also like to discuss having ingrown nail removed at next visit.  Review of Systems: Negative except as noted in the HPI. Denies N/V/F/Ch.  Past Medical History:  Diagnosis Date  . Acute pancreatitis 07/2010  . Anxiety   . Arthritis    "lower back and shoulders" (10/07/2017)  . Chronic lower back pain   . Headache    "weekly" (10/07/2017)  . Hypercholesteremia   . Hypertension   . MRSA (methicillin resistant staph aureus) culture positive   . Obesity   . Obesity   . Scoliosis   . Sleep apnea    "can't tolerate any of the masks" (10/07/2017)  . TIA (transient ischemic attack)   . Type II diabetes mellitus (HCC)   . Umbilical hernia     Current Outpatient Medications:  .  NON FORMULARY, [facility]  Peripheral Neuropathy Cream- Bupivacaine 1%, Doxepin 3%, Gabapentin 6%, Pentoxifylline 3%, Topiramate 1% Apply 1-2 grams to affected area 3-4 times daily Qty. 120 gm 3 refills, Disp: , Rfl:  .  amLODipine (NORVASC) 10 MG tablet, Take 1 tablet (10 mg total) by mouth daily., Disp: 90 tablet, Rfl: 0 .  aspirin EC 81 MG EC tablet, Take 1 tablet (81 mg total) by mouth daily., Disp: 90 tablet, Rfl: 0 .  atorvastatin (LIPITOR) 40 MG tablet, Take 1 tablet (40 mg total) by mouth daily at 6 PM., Disp: 90 tablet, Rfl: 0 .  clopidogrel (PLAVIX) 75 MG tablet, Take 1 tablet (75 mg total) by mouth daily., Disp: 90 tablet, Rfl: 0 .  gabapentin (NEURONTIN) 300 MG capsule, Take 300 mg by mouth  2 (two) times daily., Disp: , Rfl:  .  insulin NPH-regular Human (NOVOLIN 70/30) (70-30) 100 UNIT/ML injection, Inject 40-50 Units into the skin See admin instructions. 50 units in the am and 40 units at night, Disp: , Rfl:  .  metFORMIN (GLUCOPHAGE-XR) 500 MG 24 hr tablet, Take 2 tablets (1,000 mg total) by mouth 2 (two) times daily with a meal., Disp: 120 tablet, Rfl: 2 .  pioglitazone (ACTOS) 15 MG tablet, Take 15 mg by mouth daily., Disp: , Rfl: 6 .  propranolol (INDERAL) 10 MG tablet, Take 10 mg by mouth daily., Disp: , Rfl: 3  Social History   Tobacco Use  Smoking Status Former Smoker  . Packs/day: 2.50  . Years: 20.00  . Pack years: 50.00  . Types: Cigarettes  Smokeless Tobacco Former Neurosurgeon  . Types: Snuff  Tobacco Comment   10/07/2017 "quit years ago"    No Known Allergies Objective:  There were no vitals filed for this visit. There is no height or weight on file to calculate BMI. Constitutional Well developed. Well nourished.  Vascular Dorsalis pedis pulses palpable bilaterally. Posterior tibial pulses palpable bilaterally. Capillary refill normal to all digits.  No cyanosis or clubbing noted. Pedal hair growth normal.  Neurologic Normal speech.  Oriented to person, place, and time. Epicritic sensation to light touch grossly present bilaterally.  Dermatologic Nails well groomed and normal in appearance. No open wounds. No skin lesions.  Orthopedic: Normal joint ROM without pain or crepitus bilaterally. No visible deformities. Tender to palpation at the calcaneal tuber bilaterally. No pain with calcaneal squeeze bilaterally. Ankle ROM diminished range of motion bilaterally. Silfverskiold Test: positive bilaterally.   Radiographs: Taken and reviewed. No acute fractures or dislocations. No evidence of stress fracture.  Plantar heel spur present. Posterior heel spur present.  Assessment:   1. Plantar fasciitis   2. Equinus deformity of foot   3. Heel spur, left     4. Heel spur, right   5. Bursitis of heel, left   6. Bursitis of heel, right    Plan:  Patient was evaluated and treated and all questions answered.  Plantar Fasciitis, bilaterally - XR reviewed as above.  - Educated on icing and stretching. Instructions given.  - Injection delivered to the plantar fascia as below. - DME: Night splint dispensed. - Pharmacologic management: Over-the-counter anti-inflammatory medicines -Would benefit from custom molded orthotics  Procedure: Injection Tendon/Ligament Location: Bilateral plantar fascia at the glabrous junction; medial approach. Skin Prep: alcohol Injectate: 1 cc 0.5% marcaine plain, 1 cc dexamethasone phosphate, 0.5 cc kenalog 10. Disposition: Patient tolerated procedure well. Injection site dressed with a band-aid.   Onychodystrophy -Follow-up next visit for permanent nail removal  Return in about 3 weeks (around 11/13/2017) for Nail Procedure, , Plantar fasciitis.

## 2017-11-04 ENCOUNTER — Ambulatory Visit (INDEPENDENT_AMBULATORY_CARE_PROVIDER_SITE_OTHER): Payer: BLUE CROSS/BLUE SHIELD | Admitting: Orthotics

## 2017-11-04 DIAGNOSIS — M7732 Calcaneal spur, left foot: Secondary | ICD-10-CM

## 2017-11-04 DIAGNOSIS — M722 Plantar fascial fibromatosis: Secondary | ICD-10-CM | POA: Diagnosis not present

## 2017-11-04 DIAGNOSIS — M7731 Calcaneal spur, right foot: Secondary | ICD-10-CM

## 2017-11-04 NOTE — Progress Notes (Signed)

## 2017-11-07 ENCOUNTER — Ambulatory Visit: Payer: BLUE CROSS/BLUE SHIELD | Admitting: Podiatry

## 2017-11-13 ENCOUNTER — Ambulatory Visit: Payer: BLUE CROSS/BLUE SHIELD | Admitting: Podiatry

## 2017-11-13 ENCOUNTER — Encounter: Payer: Self-pay | Admitting: Podiatry

## 2017-11-13 DIAGNOSIS — M79674 Pain in right toe(s): Secondary | ICD-10-CM | POA: Diagnosis not present

## 2017-11-13 DIAGNOSIS — L603 Nail dystrophy: Secondary | ICD-10-CM | POA: Diagnosis not present

## 2017-11-13 DIAGNOSIS — M722 Plantar fascial fibromatosis: Secondary | ICD-10-CM | POA: Diagnosis not present

## 2017-11-13 DIAGNOSIS — M7732 Calcaneal spur, left foot: Secondary | ICD-10-CM | POA: Diagnosis not present

## 2017-11-13 NOTE — Patient Instructions (Signed)

## 2017-11-13 NOTE — Progress Notes (Signed)
Subjective:  Patient ID: [name], male    DOB: 03/29/1975,  MRN: [id number]  Chief Complaint  Patient presents with  . Nail Problem     3 wk follow up-toenail removal  . Plantar Fasciitis    PF left-per dr Deeandra Jerry    42 y.o. male presents with the above complaint.  States that the left heel is doing better.  Still having occasional pains.  Here for planned nail removal.  Review of Systems: Negative except as noted in the HPI. Denies N/V/F/Ch.  Past Medical History:  Diagnosis Date  . Acute pancreatitis 07/2010  . Anxiety   . Arthritis    "lower back and shoulders" (10/07/2017)  . Chronic lower back pain   . Headache    "weekly" (10/07/2017)  . Hypercholesteremia   . Hypertension   . MRSA (methicillin resistant staph aureus) culture positive   . Obesity   . Obesity   . Scoliosis   . Sleep apnea    "can't tolerate any of the masks" (10/07/2017)  . TIA (transient ischemic attack)   . Type II diabetes mellitus (HCC)   . Umbilical hernia     Current Outpatient Medications:  .  amLODipine (NORVASC) 10 MG tablet, Take 1 tablet (10 mg total) by mouth daily., Disp: 90 tablet, Rfl: 0 .  aspirin EC 81 MG EC tablet, Take 1 tablet (81 mg total) by mouth daily., Disp: 90 tablet, Rfl: 0 .  atorvastatin (LIPITOR) 40 MG tablet, Take 1 tablet (40 mg total) by mouth daily at 6 PM., Disp: 90 tablet, Rfl: 0 .  clopidogrel (PLAVIX) 75 MG tablet, Take 1 tablet (75 mg total) by mouth daily., Disp: 90 tablet, Rfl: 0 .  gabapentin (NEURONTIN) 300 MG capsule, Take 300 mg by mouth 2 (two) times daily., Disp: , Rfl:  .  insulin NPH-regular Human (NOVOLIN 70/30) (70-30) 100 UNIT/ML injection, Inject 40-50 Units into the skin See admin instructions. 50 units in the am and 40 units at night, Disp: , Rfl:  .  metFORMIN (GLUCOPHAGE-XR) 500 MG 24 hr tablet, Take 2 tablets (1,000 mg total) by mouth 2 (two) times daily with a meal., Disp: 120 tablet, Rfl: 2 .  NON FORMULARY, [facility]  Peripheral  Neuropathy Cream- Bupivacaine 1%, Doxepin 3%, Gabapentin 6%, Pentoxifylline 3%, Topiramate 1% Apply 1-2 grams to affected area 3-4 times daily Qty. 120 gm 3 refills, Disp: , Rfl:  .  pioglitazone (ACTOS) 15 MG tablet, Take 15 mg by mouth daily., Disp: , Rfl: 6 .  propranolol (INDERAL) 10 MG tablet, Take 10 mg by mouth daily., Disp: , Rfl: 3  Social History   Tobacco Use  Smoking Status Former Smoker  . Packs/day: 2.50  . Years: 20.00  . Pack years: 50.00  . Types: Cigarettes  Smokeless Tobacco Former NeurosurgeonUser  . Types: Snuff  Tobacco Comment   10/07/2017 "quit years ago"    No Known Allergies Objective:  There were no vitals filed for this visit. There is no height or weight on file to calculate BMI. Constitutional Well developed. Well nourished.  Vascular Dorsalis pedis pulses palpable bilaterally. Posterior tibial pulses palpable bilaterally. Capillary refill normal to all digits.  No cyanosis or clubbing noted. Pedal hair growth normal.  Neurologic Normal speech. Oriented to person, place, and time. Epicritic sensation to light touch grossly present bilaterally.  Dermatologic  dystrophic nails left hallux, left second toe, right second toe No open wounds. No skin lesions.  Orthopedic: Normal joint ROM without pain or crepitus  bilaterally. No visible deformities. No pain palpation about the medial calcaneal tuber bilat   Radiographs: None Assessment:  No diagnosis found. Plan:  Patient was evaluated and treated and all questions answered.  Plantar Fasciitis, bilaterally - Improved somewhat. No injection today. Continue stretching and icing.  Dystrophic nails left hallux and second toe -Patient elects to proceed with ingrown toenail removal today.  Discussed possible delayed healing due to diabetes -Nail excised. See procedure note. -Educated on post-procedure care including soaking. Written instructions provided. -Patient to follow up in 2 weeks for nail  check.  Procedure: Excision of Toenail Location: Left 1st toe and 2nd toe  Anesthesia: Lidocaine 1% plain; 1.31mL and Marcaine 0.5% plain; 1.63mL, digital block. Skin Prep: Alcohol. Dressing: Silvadene; telfa; dry, sterile, compression dressing. Technique: Following skin prep, the toe was exsanguinated and a tourniquet was secured at the base of the toe. The entirety of the nail was freed and avulsed with a hemostat. Chemical matrixectomy was then performed with phenol and irrigated out with alcohol. The tourniquet was then removed and sterile dressing applied. Disposition: Patient tolerated procedure well. Patient to return in 2 weeks for follow-up.   Dystrophic nail right second toe -We will plan for removal at next visit  Return in about 2 weeks (around 11/27/2017) for R 2nd toenail removal and nail check L 1st.2nd toes .

## 2017-11-18 ENCOUNTER — Telehealth (INDEPENDENT_AMBULATORY_CARE_PROVIDER_SITE_OTHER): Payer: Self-pay | Admitting: Physical Medicine and Rehabilitation

## 2017-11-19 NOTE — Telephone Encounter (Signed)
Scheduled for 9/19 at 1330.

## 2017-11-19 NOTE — Telephone Encounter (Signed)
ok 

## 2017-11-19 NOTE — Telephone Encounter (Signed)
Left message for patient to call back to schedule.  °

## 2017-11-20 ENCOUNTER — Encounter (INDEPENDENT_AMBULATORY_CARE_PROVIDER_SITE_OTHER): Payer: Self-pay | Admitting: Physical Medicine and Rehabilitation

## 2017-11-20 ENCOUNTER — Ambulatory Visit (INDEPENDENT_AMBULATORY_CARE_PROVIDER_SITE_OTHER): Payer: BLUE CROSS/BLUE SHIELD | Admitting: Physical Medicine and Rehabilitation

## 2017-11-20 ENCOUNTER — Ambulatory Visit (INDEPENDENT_AMBULATORY_CARE_PROVIDER_SITE_OTHER): Payer: Self-pay

## 2017-11-20 DIAGNOSIS — M4125 Other idiopathic scoliosis, thoracolumbar region: Secondary | ICD-10-CM | POA: Diagnosis not present

## 2017-11-20 DIAGNOSIS — M93001 Unspecified slipped upper femoral epiphysis (nontraumatic), right hip: Secondary | ICD-10-CM

## 2017-11-20 DIAGNOSIS — M25551 Pain in right hip: Secondary | ICD-10-CM | POA: Diagnosis not present

## 2017-11-20 NOTE — Progress Notes (Signed)
 .  Numeric Pain Rating Scale and Functional Assessment Average Pain 8   In the last MONTH (on 0-10 scale) has pain interfered with the following?  1. General activity like being  able to carry out your everyday physical activities such as walking, climbing stairs, carrying groceries, or moving a chair?  Rating(4)   -Dye Allergies.  

## 2017-11-20 NOTE — Patient Instructions (Signed)

## 2017-11-20 NOTE — Progress Notes (Signed)
[name] - 42 y.o. male MRN [id number]  Date of birth: 06/26/1975  Office Visit Note: Visit Date: 11/20/2017 PCP: [name], [name], MD Referred by: [name], [name], MD  Subjective: Chief Complaint  Patient presents with  . Right Hip - Pain  . Lower Back - Pain   HPI: [name] is a 42 year old gentleman that I first saw in September of last year through his primary care physician Dr. Dossie Arbouran [name].  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  Prior hip injection at that point gave him quite a bit of relief.  He is also an injection after that in the left hip with good relief.  He has ongoing chronic pain of the low back and bilateral hips.  He has had MRI of the lower back showing disc herniations and scoliosis.  He has had no prior back surgery.  He continues to work.  He reports severe worsening pain over the last week but chronically progressive worsening over the last 6 weeks.  He is reporting pain in the lower back on the right side no real pain in the groin but it hurts when he walks and tries to bend.  Nothing is really helping with his pain at this point.  He has used anti-inflammatory medicine and sporadic times for relief even though he is on Plavix anticoagulation.  He is an insulin-dependent diabetic with complication.  Last hemoglobin A1c was 9.4.  He has had no specific trauma or focal weakness that is new.  No change of bowel bladder function no fevers chills or night sweats.  Really no red flag symptoms.  He was recently evaluated emergency department for what was thought to be a transient ischemic attack and some blockage of potentially vertebral artery.  He is doing well after that visit however.  Continues on blood thinner Plavix.   Review of Systems  Constitutional: Negative for chills, fever, malaise/fatigue and weight loss.  HENT: Negative for hearing loss and sinus pain.   Eyes: Negative for blurred vision, double  vision and photophobia.  Respiratory: Negative for cough and shortness of breath.   Cardiovascular: Negative for chest pain, palpitations and leg swelling.  Gastrointestinal: Negative for abdominal pain, nausea and vomiting.  Genitourinary: Negative for flank pain.  Musculoskeletal: Positive for back pain and joint pain. Negative for myalgias.  Skin: Negative for itching and rash.  Neurological: Positive for tingling. Negative for tremors, focal weakness and weakness.  Endo/Heme/Allergies: Negative.   Psychiatric/Behavioral: Negative for depression.  All other systems reviewed and are negative.  Otherwise per HPI.  Assessment & Plan: Visit Diagnoses:  1. Pain in right hip   2. Other idiopathic scoliosis, thoracolumbar region   3. 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  Patient with insulin-dependent diabetes with high hemoglobin A1c and some complications from this.  Patient continues to smoke.  At this point his hip is painful to rotation is very stiff on the right.  He feels like this is more of his hip and not his back.  We will go ahead with diagnostic of therapeutic right hip injection today.  If he does not get much relief with that I would consider looking at his back.  Patient would not be a great  candidate for any chronic opioid therapy.  He does continue to use gabapentin .  he is doing well with continued activity and working and intermittent injection.  Would be a complicated patient but could consider hip replacement at some point.    Meds & Orders: No orders of the defined types were placed in this encounter.   Orders Placed This Encounter  Procedures  . Large Joint Inj: R hip joint  . XR C-ARM NO REPORT    Follow-up:  Return if symptoms worsen or fail to improve.   Procedures: Large Joint Inj: R hip joint on 11/20/2017 1:37 PM Indications: diagnostic evaluation and pain Details: 22 G 3.5 in needle, fluoroscopy-guided anterior approach  Arthrogram: No  Medications: 80 mg triamcinolone acetonide 40 MG/ML; 3 mL bupivacaine 0.5 % Outcome: tolerated well, no immediate complications  There was excellent flow of contrast producing a partial arthrogram of the hip. The patient did have relief of symptoms during the anesthetic phase of the injection. Procedure, treatment alternatives, risks and benefits explained, specific risks discussed. Consent was given by the patient. Immediately prior to procedure a time out was called to verify the correct patient, procedure, equipment, support staff and site/side marked as required. Patient was prepped and draped in the usual sterile fashion.      No notes on file   Clinical History: LUMBAR SPINE - COMPLETE 4+ VIEW  COMPARISON:  None.  FINDINGS: Degenerative lumbar spondylosis with multilevel disc disease and facet disease. Left convex scoliosis. No acute fracture. The visualized bony pelvis is intact. Bridging osteophytes noted in the lower thoracic spine. Aortic calcifications without definite aneurysm.  IMPRESSION: Scoliosis and degenerative lumbar spondylosis but no acute bony findings.   Electronically Signed   By: Rudie Meyer [name]   On: 05/23/2014 23:04  MRI LUMBAR SPINE WITHOUT CONTRAST  Technique: Multiplanar and multiecho pulse sequences of the lumbar spine were obtained without intravenous contrast.  Comparison: Plain films 06/17/2007  Findings: The sagittal MR images demonstrate normal overall alignment of the lumbar vertebral bodies. They demonstrate normal marrow signal. There is disc desiccation at L2-L3 and at L5-S1. There are moderate degenerative changes for the patient's age. The last full intervertebral the space is labeled  L5-S1. This correlates with the plain films. The conus medullaris terminates at L1.  L1-L2: No significant findings  L2-L3: Broad-based shallow disc protrusion with flattening of the anterior thecal sac. There is also a shallow right foraminal disc protrusion with right foraminal encroachment.  L3-L4: Moderate sized central and right paracentral disc protrusion with mass effect on the anterior thecal sac. Minimal medial foraminal encroachment also  L4-L5: Moderate to large central disc protrusion with significant mass effect on the anterior thecal sac. There is also right lateral recess stenosis and biforaminal stenosis, right greater than left.  L5-S1: Moderate to large central and right paracentral disc protrusion. There is mass effect on the anterior thecal sac. There is also a moderate sized broad-based left foraminal disc protrusion which contacts and compresses the left L5 nerve root. Mild right foraminal stenosis.  IMPRESSION:  1. Multilevel disc disease with specific findings as discussed above. There are significant disc protrusions at L3-L4, L4-L5 and L5-S1.   He reports that he has quit smoking. His smoking use included cigarettes. He has a 50.00 pack-year smoking history. He has quit using smokeless tobacco.  His smokeless tobacco use included snuff.  Recent Labs    10/07/17 0921  HGBA1C 9.4*    Objective:  VS:  HT:  WT:   BMI:     BP:   HR: bpm  TEMP: ( )  RESP:  Physical Exam  Constitutional: He is oriented to person, place, and time. He appears well-developed and well-nourished. No distress.  HENT:  Head: Normocephalic and atraumatic.  Eyes: Pupils are equal, round, and reactive to light. Conjunctivae are normal.  Neck: Normal range of motion. Neck supple.  Cardiovascular: Regular rhythm and intact distal pulses.  Pulmonary/Chest: Effort normal. No respiratory distress.  Musculoskeletal:  Patient is slow to rise from seated position but does  ambulate without aid.  He has pain with extension of the lumbar spine.  Very stiff hip rotation on the right compared to the left but significant pain with external rotation some pain with internal rotation no real groin pain on the right.  Does reproduce his pain however.  He has good distal strength without clonus.  Has some mild edema.  Neurological: He is alert and oriented to person, place, and time. He exhibits normal muscle tone. Coordination normal.  Skin: Skin is warm and dry. No rash noted. No erythema.  Psychiatric: He has a normal mood and affect.  Nursing note and vitals reviewed.   Ortho Exam Imaging: No results found.  Past Medical/Family/Surgical/Social History: Medications & Allergies reviewed per EMR, new medications updated. Patient Active Problem List   Diagnosis Date Noted  . TIA (transient ischemic attack) 10/07/2017   Past Medical History:  Diagnosis Date  . Acute pancreatitis 07/2010  . Anxiety   . Arthritis    "lower back and shoulders" (10/07/2017)  . Chronic lower back pain   . Headache    "weekly" (10/07/2017)  . Hypercholesteremia   . Hypertension   . MRSA (methicillin resistant staph aureus) culture positive   . Obesity   . Obesity   . Scoliosis   . Sleep apnea    "can't tolerate any of the masks" (10/07/2017)  . TIA (transient ischemic attack)   . Type II diabetes mellitus (HCC)   . Umbilical hernia    Family History  Problem Relation Age of Onset  . Hypertension Mother   . Hypertension Father    Past Surgical History:  Procedure Laterality Date  . HIP PINNING Bilateral    "they are worn out" (10/07/2017)  . PICC LINE INSERTION  07/2010   "in hospital for ~ 2 months; took PICC out when I was discharged"  . 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 Not on file  Tobacco Use  . Smoking status: Former Smoker    Packs/day: 2.50    Years: 20.00     Pack years: 50.00    Types: Cigarettes  . Smokeless tobacco: Former Neurosurgeon    Types: Snuff  . Tobacco comment: 10/07/2017 "quit years ago"  Substance and Sexual Activity  . Alcohol use: Not Currently  . Drug use: No  . Sexual activity: Yes

## 2017-11-25 ENCOUNTER — Ambulatory Visit: Payer: BLUE CROSS/BLUE SHIELD | Admitting: Orthotics

## 2017-11-25 DIAGNOSIS — M7731 Calcaneal spur, right foot: Secondary | ICD-10-CM

## 2017-11-25 DIAGNOSIS — M722 Plantar fascial fibromatosis: Secondary | ICD-10-CM

## 2017-11-25 DIAGNOSIS — M216X9 Other acquired deformities of unspecified foot: Secondary | ICD-10-CM

## 2017-11-25 NOTE — Progress Notes (Signed)
rescanned due to scans being lost.

## 2017-11-27 ENCOUNTER — Ambulatory Visit: Payer: BLUE CROSS/BLUE SHIELD | Admitting: Podiatry

## 2017-11-28 ENCOUNTER — Encounter (INDEPENDENT_AMBULATORY_CARE_PROVIDER_SITE_OTHER): Payer: Self-pay | Admitting: Physical Medicine and Rehabilitation

## 2017-11-28 MED ORDER — BUPIVACAINE HCL 0.5 % IJ SOLN
3.0000 mL | INTRAMUSCULAR | Status: AC | PRN
  Administered 2017-11-20: 3 mL via INTRA_ARTICULAR

## 2017-11-28 MED ORDER — TRIAMCINOLONE ACETONIDE 40 MG/ML IJ SUSP
80.0000 mg | INTRAMUSCULAR | Status: AC | PRN
  Administered 2017-11-20: 80 mg via INTRA_ARTICULAR

## 2017-12-02 ENCOUNTER — Ambulatory Visit (INDEPENDENT_AMBULATORY_CARE_PROVIDER_SITE_OTHER): Payer: BLUE CROSS/BLUE SHIELD | Admitting: Orthotics

## 2017-12-02 DIAGNOSIS — M7731 Calcaneal spur, right foot: Secondary | ICD-10-CM

## 2017-12-02 DIAGNOSIS — M722 Plantar fascial fibromatosis: Secondary | ICD-10-CM

## 2017-12-02 DIAGNOSIS — M7732 Calcaneal spur, left foot: Secondary | ICD-10-CM

## 2017-12-02 NOTE — Progress Notes (Signed)
Patient came in today to pick up custom made foot orthotics.  The goals were accomplished and the patient reported no dissatisfaction with said orthotics.  Patient was advised of breakin period and how to report any issues. 

## 2017-12-12 ENCOUNTER — Ambulatory Visit: Payer: BLUE CROSS/BLUE SHIELD | Admitting: Podiatry

## 2017-12-12 DIAGNOSIS — L603 Nail dystrophy: Secondary | ICD-10-CM | POA: Diagnosis not present

## 2017-12-12 DIAGNOSIS — M722 Plantar fascial fibromatosis: Secondary | ICD-10-CM | POA: Diagnosis not present

## 2017-12-12 DIAGNOSIS — L600 Ingrowing nail: Secondary | ICD-10-CM | POA: Diagnosis not present

## 2017-12-12 NOTE — Progress Notes (Signed)
Subjective:  Patient ID: [name], male    DOB: 22-Feb-1976,  MRN: [id number]  Chief Complaint  Patient presents with  . Nail Problem    R 2nd toenail removal and nail check L 1st.2nd toes  . Plantar Fasciitis    42 y.o. male presents with the above complaint. States the nails are doing well. Reports continued pain in the left heel. Was scheduled today for removal of the right 2nd toenail but does not want to proceed today.  Review of Systems: Negative except as noted in the HPI. Denies N/V/F/Ch.  Past Medical History:  Diagnosis Date  . Acute pancreatitis 07/2010  . Anxiety   . Arthritis    "lower back and shoulders" (10/07/2017)  . Chronic lower back pain   . Headache    "weekly" (10/07/2017)  . Hypercholesteremia   . Hypertension   . MRSA (methicillin resistant staph aureus) culture positive   . Obesity   . Obesity   . Scoliosis   . Sleep apnea    "can't tolerate any of the masks" (10/07/2017)  . TIA (transient ischemic attack)   . Type II diabetes mellitus (HCC)   . Umbilical hernia     Current Outpatient Medications:  .  amLODipine (NORVASC) 10 MG tablet, Take 1 tablet (10 mg total) by mouth daily., Disp: 90 tablet, Rfl: 0 .  aspirin EC 81 MG EC tablet, Take 1 tablet (81 mg total) by mouth daily., Disp: 90 tablet, Rfl: 0 .  atorvastatin (LIPITOR) 40 MG tablet, Take 1 tablet (40 mg total) by mouth daily at 6 PM., Disp: 90 tablet, Rfl: 0 .  clopidogrel (PLAVIX) 75 MG tablet, Take 1 tablet (75 mg total) by mouth daily., Disp: 90 tablet, Rfl: 0 .  gabapentin (NEURONTIN) 300 MG capsule, Take 300 mg by mouth 2 (two) times daily., Disp: , Rfl:  .  insulin NPH-regular Human (NOVOLIN 70/30) (70-30) 100 UNIT/ML injection, Inject 40-50 Units into the skin See admin instructions. 50 units in the am and 40 units at night, Disp: , Rfl:  .  metFORMIN (GLUCOPHAGE-XR) 500 MG 24 hr tablet, Take 2 tablets (1,000 mg total) by mouth 2 (two) times daily with a meal., Disp: 120 tablet, Rfl:  2 .  NON FORMULARY, [facility]  Peripheral Neuropathy Cream- Bupivacaine 1%, Doxepin 3%, Gabapentin 6%, Pentoxifylline 3%, Topiramate 1% Apply 1-2 grams to affected area 3-4 times daily Qty. 120 gm 3 refills, Disp: , Rfl:  .  pioglitazone (ACTOS) 15 MG tablet, Take 15 mg by mouth daily., Disp: , Rfl: 6 .  propranolol (INDERAL) 10 MG tablet, Take 10 mg by mouth daily., Disp: , Rfl: 3  Social History   Tobacco Use  Smoking Status Former Smoker  . Packs/day: 2.50  . Years: 20.00  . Pack years: 50.00  . Types: Cigarettes  Smokeless Tobacco Former Neurosurgeon  . Types: Snuff  Tobacco Comment   10/07/2017 "quit years ago"    No Known Allergies Objective:  There were no vitals filed for this visit. There is no height or weight on file to calculate BMI. Constitutional Well developed. Well nourished.  Vascular Dorsalis pedis pulses palpable bilaterally. Posterior tibial pulses palpable bilaterally. Capillary refill normal to all digits.  No cyanosis or clubbing noted. Pedal hair growth normal.  Neurologic Normal speech. Oriented to person, place, and time. Epicritic sensation to light touch grossly present bilaterally.  Dermatologic Nails  No open wounds. No skin lesions.  Orthopedic: Normal joint ROM without pain or crepitus bilaterally. No  visible deformities. No pain palpation about the medial calcaneal tuber bilat   Radiographs: None Assessment:   1. Plantar fasciitis   2. Nail dystrophy   3. Ingrown toenail    Plan:  Patient was evaluated and treated and all questions answered.  S/p Nail Removal L hallux, L 2nd toenail -Healing well without issue.  Plantar Fasciitis Left -Injection as below. -Continue stretching and icing, orthotics.  Procedure: Injection Tendon/Ligament Consent: Verbal consent obtained. Location: Left plantar fascia at the glabrous junction; medial approach. Skin Prep: Alcohol. Injectate: 1 cc 0.5% marcaine plain, 1 cc dexamethasone phosphate,  0.5 cc kenalog 10. Disposition: Patient tolerated procedure well. Injection site dressed with a band-aid.  Return if symptoms worsen or fail to improve.

## 2018-02-04 ENCOUNTER — Telehealth: Payer: Self-pay | Admitting: *Deleted

## 2018-02-05 ENCOUNTER — Other Ambulatory Visit (HOSPITAL_COMMUNITY): Payer: Self-pay | Admitting: Internal Medicine

## 2018-02-05 DIAGNOSIS — M79661 Pain in right lower leg: Secondary | ICD-10-CM

## 2018-02-06 ENCOUNTER — Ambulatory Visit (HOSPITAL_COMMUNITY)
Admission: RE | Admit: 2018-02-06 | Discharge: 2018-02-06 | Disposition: A | Discharge status: Home / Self Care | Payer: BLUE CROSS/BLUE SHIELD | Source: Ambulatory Visit | Attending: Internal Medicine | Admitting: Internal Medicine

## 2018-02-06 DIAGNOSIS — M79661 Pain in right lower leg: Secondary | ICD-10-CM | POA: Insufficient documentation

## 2018-02-19 DIAGNOSIS — I82401 Acute embolism and thrombosis of unspecified deep veins of right lower extremity: Secondary | ICD-10-CM | POA: Insufficient documentation

## 2018-02-19 NOTE — Telephone Encounter (Signed)
error 

## 2018-03-10 DIAGNOSIS — J310 Chronic rhinitis: Secondary | ICD-10-CM | POA: Insufficient documentation

## 2018-04-20 ENCOUNTER — Other Ambulatory Visit: Payer: Self-pay

## 2018-04-20 ENCOUNTER — Ambulatory Visit (HOSPITAL_COMMUNITY)
Admission: EM | Admit: 2018-04-20 | Discharge: 2018-04-20 | Disposition: A | Discharge status: Home / Self Care | Payer: BLUE CROSS/BLUE SHIELD | Attending: Internal Medicine | Admitting: Internal Medicine

## 2018-04-20 ENCOUNTER — Encounter (HOSPITAL_COMMUNITY): Payer: Self-pay | Admitting: Emergency Medicine

## 2018-04-20 ENCOUNTER — Ambulatory Visit (INDEPENDENT_AMBULATORY_CARE_PROVIDER_SITE_OTHER): Payer: BLUE CROSS/BLUE SHIELD

## 2018-04-20 DIAGNOSIS — S91332A Puncture wound without foreign body, left foot, initial encounter: Secondary | ICD-10-CM | POA: Diagnosis not present

## 2018-04-20 DIAGNOSIS — W268XXA Contact with other sharp object(s), not elsewhere classified, initial encounter: Secondary | ICD-10-CM | POA: Diagnosis not present

## 2018-04-20 DIAGNOSIS — S99922A Unspecified injury of left foot, initial encounter: Secondary | ICD-10-CM

## 2018-04-20 MED ORDER — CEPHALEXIN 500 MG PO CAPS: 500.0000 mg | ORAL_CAPSULE | Freq: Four times a day (QID) | ORAL | 0 refills | Status: AC

## 2018-04-20 NOTE — ED Provider Notes (Signed)
[facility]    CSN: [id number] Arrival date & time: 04/20/18  1620     History   Chief Complaint Chief Complaint  Patient presents with  . Foot Injury    HPI [name] is a 43 y.o. male.   [name] presents with concern for foreign body to the plantar aspect of left heel after he accidentally stepped on a metal shaving this morning. 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 He stepped on the shaving, pulled it out, but is not certain if any shaving still remains in the foot. He is concerned as he is a diabetic as well. Minimal pain. States he is unable to completely visualize the affected area. No new change in sensation. Hx of pancreatitis, anxiety, arthritis, headache, back pain, htn, MRSA, obesity, TIA, DM.    ROS per HPI.      Past Medical History:  Diagnosis Date  . Acute pancreatitis 07/2010  . Anxiety   . Arthritis    "lower back and shoulders" (10/07/2017)  . Chronic lower back pain   . Headache    "weekly" (10/07/2017)  . Hypercholesteremia   . Hypertension   . MRSA (methicillin resistant staph aureus) culture positive   . Obesity   . Obesity   . Scoliosis   . Sleep apnea    "can't tolerate any of the masks" (10/07/2017)  . TIA (transient ischemic attack)   . Type II diabetes mellitus (HCC)   . Umbilical hernia     Patient Active Problem List   Diagnosis Date Noted  . TIA (transient ischemic attack) 10/07/2017    Past Surgical History:  Procedure Laterality Date  . HIP PINNING Bilateral    "they are worn out" (10/07/2017)  . PICC LINE INSERTION  07/2010   "in hospital for ~ 2 months; took PICC out when I was discharged"  . 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 Authorizing Provider  amLODipine (NORVASC) 10 MG tablet Take 1 tablet (10 mg  total) by mouth daily. 10/07/17  Yes Nyra MarketSvalina, Gorica, MD  aspirin EC 81 MG EC tablet Take 1 tablet (81 mg total) by mouth daily. 10/08/17  Yes Nyra MarketSvalina, Gorica, MD  clopidogrel (PLAVIX) 75 MG tablet Take 1 tablet (75 mg total) by mouth daily. 10/08/17  Yes Nyra MarketSvalina, Gorica, MD  gabapentin (NEURONTIN) 300 MG capsule Take 300 mg by mouth 2 (two) times daily.   Yes [provider]  Insulin Glargine (TOUJEO SOLOSTAR [place]) Inject into the skin 2 (two) times daily.   Yes [provider]  metFORMIN (GLUCOPHAGE-XR) 500 MG 24 hr tablet Take 2 tablets (1,000 mg total) by mouth 2 (two) times daily with a meal. 10/07/17  Yes Nyra MarketSvalina, Gorica, MD  propranolol (INDERAL) 10 MG tablet Take 10 mg by mouth 3 (three) times daily.   Yes [provider]  cephALEXin (KEFLEX) 500 MG capsule Take 1 capsule (500 mg total) by mouth 4 (four) times daily for 5 days. 04/20/18 04/25/18  Georgetta HaberBurky, Syvilla Martin B, NP  insulin NPH-regular Human (NOVOLIN 70/30) (70-30) 100 UNIT/ML injection Inject 40-50 Units into the skin See admin instructions. 50 units in the am and 40 units at night    [provider]  [facility]  Peripheral Neuropathy Cream- Bupivacaine  1%, Doxepin 3%, Gabapentin 6%, Pentoxifylline 3%, Topiramate 1% Apply 1-2 grams to affected area 3-4 times daily Qty. 120 gm 3 refills    [provider]  pioglitazone (ACTOS) 15 MG tablet Take 15 mg by mouth daily. 06/06/17   [provider]  propranolol (INDERAL) 10 MG tablet Take 10 mg by mouth daily. 06/06/17   [provider]    Family History Family History  Problem Relation Age of Onset  . Hypertension Mother   . Hypertension Father     Social History Social History   Tobacco Use  . Smoking status: Former Smoker    Packs/day: 2.50    Years: 20.00    Pack years: 50.00    Types: Cigarettes  . Smokeless tobacco: Former Neurosurgeon    Types: Snuff  . Tobacco comment: 10/07/2017 "quit years ago"  Substance  Use Topics  . Alcohol use: Not Currently  . Drug use: No     Allergies   Patient has no known allergies.   Review of Systems Review of Systems   Physical Exam Triage Vital Signs ED Triage Vitals [04/20/18 1818]  Enc Vitals Group     BP      Pulse      Resp      Temp      Temp src      SpO2      Weight      Height      Head Circumference      Peak Flow      Pain Score 2     Pain Loc      Pain Edu?      Excl. in GC?    No data found.  Updated Vital Signs There were no vitals taken for this visit.   Physical Exam Constitutional:      Appearance: He is well-developed.  Cardiovascular:     Rate and Rhythm: Normal rate and regular rhythm.  Pulmonary:     Effort: Pulmonary effort is normal.     Breath sounds: Normal breath sounds.  Musculoskeletal:     Left foot: Normal range of motion and normal capillary refill. No tenderness, bony tenderness, swelling, crepitus, deformity or laceration.     Comments: No visualized wound, redness, tenderness or palpable foreign bodies to left heel  Skin:    General: Skin is warm and dry.  Neurological:     Mental Status: He is alert and oriented to person, place, and time.      UC Treatments / Results  Labs (all labs ordered are listed, but only abnormal results are displayed) Labs Reviewed - No data to display  EKG None  Radiology Dg Foot Complete Left  Result Date: 04/20/2018 CLINICAL DATA:  Stepped on metal shaving EXAM: LEFT FOOT - COMPLETE 3+ VIEW COMPARISON:  10/23/2017 FINDINGS: No fracture or malalignment. No radiopaque foreign body. Large plantar calcaneal spur. Prominent enthesopathy at the posterior calcaneus. IMPRESSION: No acute osseous abnormality.  Negative for radiopaque foreign body. Electronically Signed   By: Jasmine Pang [name]   On: 04/20/2018 19:10    Procedures Procedures (including critical care time)  Medications Ordered in UC Medications - No data to display  Initial Impression /  Assessment and Plan / UC Course  I have reviewed the triage vital signs and the nursing notes.  Pertinent labs & imaging results that were available during my care of the patient were reviewed by me and considered in my medical decision making (see chart for  details).     Prophylactic cephlex provided as patient is diabetic. No FB on xray. Encouraged foot care. Return precautions provided. Patient verbalized understanding and agreeable to plan.  Ambulatory out of clinic without difficulty.    Final Clinical Impressions(s) / UC Diagnoses   Final diagnoses:  Puncture wound of plantar aspect of left foot, initial encounter     Discharge Instructions     No foreign body on xray.  I have sent for antibiotics to use to prevent infection.  Cleanse foot daily with soap and water, observe daily to monitor for wounds or signs of infection, use mirror if needed to fully examine.    ED Prescriptions    Medication Sig Dispense Auth. Provider   cephALEXin (KEFLEX) 500 MG capsule Take 1 capsule (500 mg total) by mouth 4 (four) times daily for 5 days. 20 capsule Georgetta Haber, NP     Controlled Substance Prescriptions [place] Controlled Substance Registry consulted? Not Applicable   Georgetta Haber, NP 04/20/18 1931

## 2018-04-20 NOTE — Discharge Instructions (Signed)
No foreign body on xray.  I have sent for antibiotics to use to prevent infection.  Cleanse foot daily with soap and water, observe daily to monitor for wounds or signs of infection, use mirror if needed to fully examine.

## 2018-04-20 NOTE — ED Triage Notes (Signed)
This morning stepped on metal shavings, patient was bare foot at the time.  Patient continues to have pain in left heel.

## 2018-04-21 ENCOUNTER — Encounter: Payer: Self-pay | Admitting: Neurology

## 2018-04-23 ENCOUNTER — Institutional Professional Consult (permissible substitution): Payer: BLUE CROSS/BLUE SHIELD | Admitting: Neurology

## 2018-04-23 ENCOUNTER — Telehealth: Payer: Self-pay | Admitting: Neurology

## 2018-04-23 NOTE — Telephone Encounter (Signed)
Called the pt to make him aware that our office will closing at 12 and we would need to reschedule his sleep consult apt. LVM instructing the patient of our office closing and for him to call back

## 2018-06-05 DIAGNOSIS — F419 Anxiety disorder, unspecified: Secondary | ICD-10-CM | POA: Insufficient documentation

## 2018-06-17 ENCOUNTER — Telehealth: Payer: Self-pay | Admitting: Neurology

## 2018-06-17 NOTE — Telephone Encounter (Signed)
Called the patient to inform them that our office has placed new protocols in place for our office visits. Due to Covid 19 our office is reducing our number of office visits in order to minimize the risk to our patients and healthcare providers.Our office is now providing the capability to offer the patients virtual visits at this time.  There was no answer. LVM informing the patient to call back.

## 2018-06-18 ENCOUNTER — Encounter: Payer: Self-pay | Admitting: Neurology

## 2018-06-18 NOTE — Addendum Note (Signed)
Addended by: Judi Cong on: 06/18/2018 10:52 AM   Modules accepted: Orders

## 2018-06-18 NOTE — Telephone Encounter (Signed)
Called the patient to inform them that our office has placed new protocols in place for our office visits. Due to Covid 19 our office is reducing our number of office visits in order to minimize the risk to our patients and healthcare providers.Our office is now providing the capability to offer the patients virtual visits at this time. Informed of what that process looks like and informed that the Virtual visit will still be billed through insurance as such. Due to [place],informed the patient since the appointment is taking place over the phone/internet app, we can't guarantee the security of the phone line. With that said if we do move forward I would have to get verbal consent to completed the call over the phone. Patient gave verbal consent to move forward with the video visit. I have reviewed the patient's chart and made sure that everything is up to date. Patient is also made aware that since this is a video visit we are able to complete the visit but a physical exam is not able to be done since the patient is not present in person. Pt's email is [email or web address]. Pt understands that the [employer] webex software must be downloaded and operational on the device pt plans to use for the visit. Pt verbalized understanding of this information and will states to be ready for the visit at least 15 min prior to the visit.

## 2018-06-24 ENCOUNTER — Other Ambulatory Visit: Payer: Self-pay

## 2018-06-24 ENCOUNTER — Ambulatory Visit (INDEPENDENT_AMBULATORY_CARE_PROVIDER_SITE_OTHER): Payer: BLUE CROSS/BLUE SHIELD | Admitting: Neurology

## 2018-06-24 DIAGNOSIS — G4733 Obstructive sleep apnea (adult) (pediatric): Secondary | ICD-10-CM

## 2018-06-24 DIAGNOSIS — E119 Type 2 diabetes mellitus without complications: Secondary | ICD-10-CM

## 2018-06-24 DIAGNOSIS — J301 Allergic rhinitis due to pollen: Secondary | ICD-10-CM | POA: Diagnosis not present

## 2018-06-24 DIAGNOSIS — Z6841 Body Mass Index (BMI) 40.0 and over, adult: Secondary | ICD-10-CM

## 2018-06-24 DIAGNOSIS — G459 Transient cerebral ischemic attack, unspecified: Secondary | ICD-10-CM | POA: Diagnosis not present

## 2018-06-24 DIAGNOSIS — G4719 Other hypersomnia: Secondary | ICD-10-CM

## 2018-06-24 DIAGNOSIS — D696 Thrombocytopenia, unspecified: Secondary | ICD-10-CM

## 2018-06-24 NOTE — Progress Notes (Signed)
SLEEP MEDICINE CLINIC    Virtual Visit via Video Note  I connected with [name] on 06/24/18 at  1:00 PM EDT by a video enabled telemedicine application and verified that I am speaking with the correct person using two identifiers.   I discussed the limitations of evaluation and management by telemedicine and the availability of in person appointments. The patient expressed understanding and agreed to proceed.  Provider:  Melvyn Novas, M D  Primary Care Physician:  Jason Matin, MD   Referring Provider: Jarome Matin, MD    HPI:  [name] is a 43 y.o. male patient was last seen by his primary care physician on 10 March 2018.    The patient had suffered a DVT on December 6 and was started on Xarelto.  He wears compression hose.  He has a history of diabetes mellitus type 2 complicated by peripheral neuropathy which is poorly controlled and painful.  Dyslipidemia.  Acute pancreatitis leading to hospital admission in May 2018.   Sleep and medical history: Chronic smoker- tobacco abuse was temporarily stopped in May 2018 but he restarted.   In 2010 he was referred for an obstructive sleep apnea evaluation, begun treatment with CPAP but states now that his machine is not working well.   In August 2019 followed a hospital admission for TIA.  The stroke work-up showed that he had an occlusion of the left vertebral artery and he started on antiplatelet agents as well as Lipitor 40 mg daily.  Dr. Delia [name] is his primary neurologist.  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       Chief complaint according to patient : "My CPAP isn't working". [name] states that he has used the machine he inherited from a family  member this machine was never set to his specific pressure needs.  He has purchased nasal pillows online and his nose now hurts, he cannot breathe easily and he states that he has chronic sinus congestion and rhinitis which makes the use of this kind of interface very difficult.  The pressure is too high he feels.  His sleep study was over 69 years old at the time he had an AHI of 55/h and one time he must have owned his own CPAP machine but that may have broken as well.  He states that  maybe 5 years ago another sleep study was done but the results were never given to him. This was at [facility].    Family medical and sleep history: There is no family history of obstructive sleep apnea, some close relatives of diabetes mellitus, type II.  He does not endorse a blood clotting disorder in any of his primary relatives.   Social history: The patient is separated from his wife [name], due to her substance abuse-  their 2 children live with him, he currently is a head of a household of 3 people, 2 children.   While he quit smoking briefly in 2018 he still has resumed- he smokes about 1/2 pack/day, he rarely drinks alcohol, but 3-6 caffeinated beverages per day are consumed.    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  Bedtime is usually around 4 AM and to sleep time is characterized by tossing and turning.  He states that he is asleep rather promptly and describes his bedroom as cool, quiet and dark.  He has a family in the background to eliminate any noises he sleeps with 2 pillows in all positions supine is his preferred sleep position.  Usually he rises around 9 AM after 4 to 5 hours of sleep he has one nocturia break. He reports headaches to be present in the morning and he describes a vice-like  sensation around his head, and tenderness that also is characterized as throbbing.  After he gets his kids ready he goes back to sleep for another 2 hours.  His alarm will ring at about 10 AM.  His children have told him that he snores and he wakes up with a blanket on the floor, the bed disheveled, but he is not aware if he dreams or acts out dreams.  He attributes his restlessness to his peripheral neuropathy and the DVT in his right leg left him with a painful condition as well.    Review of Systems:  Out of a complete 14 system review, the patient complains of only the following symptoms, and all other reviewed systems are negative. How likely are you to doze in the following situations: 0 = not likely, 1 = slight chance, 2 = moderate chance, 3 = high chance  Sitting and Reading? Watching Television? Sitting inactive in a public place (theater or meeting)? Lying down in the afternoon when circumstances permit? Sitting and talking to someone? Sitting quietly after lunch without alcohol? In a car, while stopped for a few minutes in traffic? As a passenger in a car for an hour without a break?  Total = 12/ 24   Headaches, sniffling, sinusitis, rhinitis, chronic cough, leg pain, neuropathic and DVT related pain, swelling.       Family History  Problem Relation Age of Onset  . Hypertension Mother   . Hypertension Father     Past Medical History:  Diagnosis Date  . Acute pancreatitis 07/2010  . Anxiety   . Arthritis    "lower back and shoulders" (10/07/2017)  . Chronic lower back pain   . Dyslipidemia   . Headache    "weekly" (10/07/2017)  . Hypercholesteremia   . Hypertension   . MRSA (methicillin resistant staph aureus) culture positive   . Obesity   . Obesity   . OSA on CPAP   . Peripheral neuropathy   . Scoliosis   . Sleep apnea    "can't tolerate any of the masks" (10/07/2017)  . TIA (transient ischemic attack)   . Type II diabetes mellitus (HCC)   . Umbilical  hernia     Past Surgical History:  Procedure Laterality Date  . HIP PINNING Bilateral    "they are worn out" (10/07/2017)  . PICC LINE INSERTION  07/2010   "in hospital for ~ 2 months; took PICC out when I was discharged"  . TESTICLE REMOVAL Right ~ 2012   "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"    Current Outpatient Medications  Medication Sig Dispense Refill  . amLODipine (NORVASC) 10 MG tablet Take 1 tablet (10 mg total) by mouth daily. 90 tablet 0  . aspirin EC 81 MG EC tablet Take 1 tablet (81 mg total) by mouth daily. 90 tablet 0  .  gabapentin (NEURONTIN) 300 MG capsule Take 300 mg by mouth 2 (two) times daily.    Marland Kitchen HUMALOG KWIKPEN 100 UNIT/ML KwikPen INJECT 10 UNITS SUBCUTANEOUSLY WITH EACH MEAL    . Insulin Glargine (TOUJEO SOLOSTAR ) Inject into the skin 2 (two) times daily.    . metFORMIN (GLUCOPHAGE-XR) 500 MG 24 hr tablet Take 2 tablets (1,000 mg total) by mouth 2 (two) times daily with a meal. 120 tablet 2  . pioglitazone (ACTOS) 15 MG tablet Take 15 mg by mouth daily.  6  . propranolol (INDERAL) 10 MG tablet Take 10 mg by mouth 3 (three) times daily.    Carlena Hurl 20 MG TABS tablet      No current facility-administered medications for this visit.     Allergies as of 06/24/2018  . (No Known Allergies)    Vitals: There were no vitals taken for this visit.   Last Weight:  Wt Readings from Last 1 Encounters:  10/06/17 260 lb (117.9 kg)   NFA:OZHYQ is no height or weight on file to calculate BMI.       Last Height:   Ht Readings from Last 1 Encounters:  10/06/17  (1.676 m)    Observations/Objective:   General: The patient is awake, alert and appears not in acute distress. The patient is  groomed. Head: Normocephalic, atraumatic. Neck is supple. Mallampati is grade 4. neck circumference:18.5"  . Nasal airflow restricted ,  Retrognathia is not seen.  Cardiovascular:  without distended neck veins. Respiratory: The patient was able to  hold his breath for 17 seconds. For 17 seconds Skin:  Without evidence of facial edema, or rash Trunk: BMI is 41. The patient's posture is stooped  Neurologic exam : The patient is fatiguedt, oriented to place and time.   Memory subjective described as intact.   Attention span & concentration ability appears normal.  Speech is fluent,  without dysarthria, dysphonia or aphasia.  Mood and affect are depressed  Cranial nerves: Pupils are equal .Extraocular movements  in vertical and horizontal planes intact .Facial motor strength is symmetric and tongue and uvula move midline. Shoulder shrug was symmetrical.   Motor exam:   Normal tone, muscle bulk and symmetric strength in upper extremities.  Coordination: Alternating movements in the fingers/hands   normal without evidence of ataxia, dysmetria or tremor.  Gait and station: not evaluated.   Assessment and Plan:  It will be essential for Mr. Thrun to be evaluated for the presence of obstructive sleep apnea versus central sleep apnea.   I have no doubt from his description that he still suffers from obstructive sleep apnea and at one time he was still diagnosed with a rather severe form.    In addition he has developed vascular disease, clotting disorder, and cerebrovascular disease.   He has resumed smoking and why he might not have developed COPD he is certainly at risk for prolonged hypoxemia at night and may have central apnea as well.   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  I will order a home sleep test first week and follow with a auto titration CPAP for treatment, if the central apnea form is indicated by home sleep test I would have to wait and treat this patient once a sleep laboratory is reopened.    Follow Up Instructions:  HST for pick up    I discussed  the assessment and treatment plan with the  patient. The patient was provided an opportunity to ask questions and all were answered. The patient agreed with the plan and demonstrated an understanding of the instructions.   The patient was advised to call back or seek an in-person evaluation if the symptoms worsen or if the condition fails to improve as anticipated.  I provided 25 minutes of non-face-to-face time during this encounter.   Melvyn Novas, MD  Rv with NP / Dr Pearlean Brownie in 2-3 month.  Melvyn Novas, MD 06/24/2018, 1:35 PM  Certified in Neurology by ABPN Certified in Sleep Medicine by [facility] [address] [place], [place] [address]     Cc Dr. Eloise Harman  Cc Dr Pearlean Brownie

## 2018-07-03 ENCOUNTER — Encounter: Payer: Self-pay | Admitting: Neurology

## 2018-07-03 DIAGNOSIS — G4733 Obstructive sleep apnea (adult) (pediatric): Secondary | ICD-10-CM | POA: Insufficient documentation

## 2018-07-03 DIAGNOSIS — J301 Allergic rhinitis due to pollen: Secondary | ICD-10-CM | POA: Insufficient documentation

## 2018-07-03 DIAGNOSIS — G4719 Other hypersomnia: Secondary | ICD-10-CM | POA: Insufficient documentation

## 2018-07-03 DIAGNOSIS — Z6841 Body Mass Index (BMI) 40.0 and over, adult: Secondary | ICD-10-CM

## 2018-07-03 DIAGNOSIS — D696 Thrombocytopenia, unspecified: Secondary | ICD-10-CM | POA: Insufficient documentation

## 2018-07-03 DIAGNOSIS — E119 Type 2 diabetes mellitus without complications: Secondary | ICD-10-CM | POA: Insufficient documentation

## 2018-07-03 NOTE — Patient Instructions (Signed)

## 2018-07-10 DIAGNOSIS — Z0000 Encounter for general adult medical examination without abnormal findings: Secondary | ICD-10-CM | POA: Insufficient documentation

## 2018-07-10 DIAGNOSIS — I739 Peripheral vascular disease, unspecified: Secondary | ICD-10-CM | POA: Insufficient documentation

## 2018-08-05 ENCOUNTER — Ambulatory Visit: Payer: BLUE CROSS/BLUE SHIELD | Admitting: Neurology

## 2018-08-05 ENCOUNTER — Other Ambulatory Visit: Payer: Self-pay

## 2018-08-05 DIAGNOSIS — Z6841 Body Mass Index (BMI) 40.0 and over, adult: Secondary | ICD-10-CM

## 2018-08-05 DIAGNOSIS — G4733 Obstructive sleep apnea (adult) (pediatric): Secondary | ICD-10-CM | POA: Diagnosis not present

## 2018-08-05 DIAGNOSIS — E1159 Type 2 diabetes mellitus with other circulatory complications: Secondary | ICD-10-CM

## 2018-08-05 DIAGNOSIS — G459 Transient cerebral ischemic attack, unspecified: Secondary | ICD-10-CM

## 2018-08-05 DIAGNOSIS — K8050 Calculus of bile duct without cholangitis or cholecystitis without obstruction: Secondary | ICD-10-CM

## 2018-08-05 DIAGNOSIS — J301 Allergic rhinitis due to pollen: Secondary | ICD-10-CM

## 2018-08-17 NOTE — Procedures (Signed)
Patient Information     First Name: [name] Last Name: [name] ID: [id number]  Birth Date: Mar 07, 1975 Age: [age] Gender: Male  Referring Provider: Jarome [name], [name], MD BMI: 41.8 (W=260 lb, H=5' 6'')  Neck Circ.:  18 '' Epworth:  12/24   Sleep Study Information    Study Date: Aug 05, 2018 S/H/A Version: 5.1.76.4 / 4.1.1528 / 776  History: [name] is a 43 y.o. male patient and was last seen by his primary care physician on 10 March 2018.  The patient had suffered a DVT on December 6 and was started on Xarelto.  He wears compression hose.  He has a history of diabetes mellitus type 2 complicated by peripheral neuropathy which is poorly controlled and painful.  Dyslipidemia.  Acute pancreatitis leading to hospital admission in May 2018.  Chronic smoker- tobacco abuse was temporarily stopped in May 2018 but he restarted.  In 2010 he was referred for an obstructive sleep apnea evaluation, begun treatment with CPAP but states now that his machine is not working well. In August 2019 followed a hospital admission for TIA.  The stroke work-up showed that he had an occlusion of the left vertebral artery and he started on antiplatelet agents as well as Lipitor 40 mg daily.  Dr. Delia HeadyPramod [name] is his primary neurologist. He also has defined morbid obesity, suffers from an umbilical hernia, erectile dysfunction, scoliosis. 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     Summary & Diagnosis:       Severe obstructive Sleep Apnea was noted at an AHI of 71.9/h and associated with prolonged hypoxemia. There was more apnea in NREM than REM sleep which can indicate Complex Sleep Apnea. More than 60 minutes of recorded time were documenting hypoxia below SpO2 of 89%. The lower heart rate was not recorded, but the average heart rate was normal at 74 bpm.    Recommendations:     This degree of severity of sleep apnea and  associated hypoxia make it necessary to use CPAP rather than dental device or inspire device for treatment.  I recommend to start with auto titration capable CPAP device, heated humidity and mask of patient's choice - Should the response in AHI be not positive a change to BiPAP/ ASV  may be indicated   Electronically Signed: Melvyn Novasarmen Fransisco Messmer, MD    08-17-2018            Sleep Summary  Oxygen Saturation Statistics   Start Study Time: End Study Time: Total Recording Time:  10:37:40 PM       6:24:25 AM  7 h, 46 min  Total Sleep Time % REM of Sleep Time:  7 h, 18 min  10.4    Mean: 91 Minimum: 51 Maximum: 100  Mean of Desaturations Nadirs (%):   86  Oxygen Desaturation %:   4-9 10-20 >20 Total  Events Number Total   168  234 41.0 57.1  8 2.0  410 100.0  Oxygen Saturation: <90 <=88 <85 <80 <70  Duration (minutes): Sleep % 76.0 17.3 60.7 29.1 13.8 6.6 10.5 2.4 3.5 0.8     Respiratory Indices      Total Events REM NREM All Night  pRDI:  492  pAHI:  492 ODI:  410  pAHIc:  43  % CSR: 0.0 47.0 47.0 34.6 0.0 74.9 74.9 62.9 7.0 71.9 71.9 59.9 6.3       Pulse  Rate Statistics during Sleep (BPM)      Mean:  74 Minimum: N/A  Maximum: 112    Indices are calculated using technically valid sleep time of  6 hrs, 50 min. pRDI/pAHI are calculated using oxi desaturations ? 3%  Body Position Statistics  Position Supine Prone Right Left Non-Supine  Sleep (min) 161.0 198.2 20.0 59.0 277.3  Sleep % 36.7 45.2 4.6 13.5 63.3  pRDI 95.4 62.7 108.2 28.0 58.7  pAHI 95.4 62.7 108.2 28.0 58.7  ODI 91.8 42.2 102.0 20.4 42.0     Snoring Statistics Snoring Level (dB) >40 >50 >60 >70 >80 >Threshold (45)  Sleep (min) 400.3 234.6 104.8 0.6 0.0 272.6  Sleep % 91.3 53.5 23.9 0.1 0.0 62.2    Mean: 51 dB Sleep Stages Chart                                                                             pAHI=71.9                                                                                               Mild              Moderate                    Severe                                                 5              15                    30

## 2018-08-17 NOTE — Addendum Note (Signed)
Addended by: Larey Seat on: 08/17/2018 04:06 PM   Modules accepted: Orders

## 2018-08-18 ENCOUNTER — Telehealth: Payer: Self-pay | Admitting: Neurology

## 2018-08-18 NOTE — Telephone Encounter (Signed)
-----   Message from Larey Seat, MD sent at 08/17/2018  4:06 PM EDT ----- Severe obstructive Sleep Apnea was noted at an AHI of 71.9/h and associated with prolonged hypoxemia. There was more apnea in NREM than REM sleep which can indicate Complex Sleep Apnea. More than 60 minutes of recorded time were documenting hypoxia below SpO2 of 89%. The lower heart rate was not recorded, but the average heart rate was normal at 74 bpm.    Recommendations:    This degree of severity of sleep apnea and associated hypoxia make it necessary to use CPAP rather than dental device or inspire device for treatment.  I recommend to start with auto titration capable CPAP device, heated humidity and mask of patient's choice - I chose a setting from 7-18 cm water with 3 cm EPR.  Should the response in AHI be not positive a change to BiPAP/ ASV  may be indicated   Electronically Signed: Larey Seat, MD    08-17-2018

## 2018-08-18 NOTE — Telephone Encounter (Signed)
Called patient to discuss sleep study results. No answer at this time. LVM for the patient to call back.   

## 2018-08-20 ENCOUNTER — Encounter: Payer: Self-pay | Admitting: Neurology

## 2018-08-20 NOTE — Telephone Encounter (Signed)
Pt returned call. I advised pt that Dr. Brett Fairy reviewed their sleep study results and found that pt severe sleep apnea. Dr. Brett Fairy recommends that pt starts auto CPAP. I reviewed PAP compliance expectations with the pt. Pt is agreeable to starting a CPAP. I advised pt that an order will be sent to a DME, [facility], and [facility] will call the pt within about one week after they file with the pt's insurance. [facility] will show the pt how to use the machine, fit for masks, and troubleshoot the CPAP if needed. A follow up appt was made for insurance purposes with Ward Givens, NP Aug 20,2020 at 11:00am. Pt verbalized understanding to arrive 15 minutes early and bring their CPAP. A letter with all of this information in it will be mailed to the pt as a reminder. I verified with the pt that the address we have on file is correct. Pt verbalized understanding of results. Pt had no questions at this time but was encouraged to call back if questions arise. I have sent the order to [facility] and have received confirmation that they have received the order.

## 2018-08-20 NOTE — Telephone Encounter (Signed)
2nd attempt reaching out to the patient to discuss sleep study results. No answer. LVM for the pt to call back.

## 2018-10-22 ENCOUNTER — Other Ambulatory Visit: Payer: Self-pay

## 2018-10-22 ENCOUNTER — Ambulatory Visit: Payer: BC Managed Care – PPO | Admitting: Adult Health

## 2018-10-22 ENCOUNTER — Encounter: Payer: Self-pay | Admitting: Adult Health

## 2018-10-22 VITALS — BP 121/86 | HR 75 | Temp 97.8°F | Ht 66.0 in | Wt 267.0 lb

## 2018-10-22 DIAGNOSIS — G4733 Obstructive sleep apnea (adult) (pediatric): Secondary | ICD-10-CM

## 2018-10-22 DIAGNOSIS — Z9989 Dependence on other enabling machines and devices: Secondary | ICD-10-CM

## 2018-10-22 NOTE — Patient Instructions (Signed)
Continue using CPAP nightly and greater than 4 hours each night °If your symptoms worsen or you develop new symptoms please let us know.  ° °

## 2018-10-22 NOTE — Progress Notes (Signed)
PATIENT: [name] DOB: 08/01/1975  REASON FOR VISIT: follow up HISTORY FROM: patient  HISTORY OF PRESENT ILLNESS: Today 10/22/18:  [name] is a 43 year old male with a history of obstructive sleep apnea on CPAP.  He returns today for follow-up.  His download indicates that he uses his machine nightly for compliance of 100%.  He uses his machine greater than 4 hours 28 days for compliance of 93%.  On average he uses his machine 6 hours and 9 minutes.  His residual AHI is 1.3 on 7 to 18 cm of water with EPR 3.  His leak in the 95th percentile is 6.8.  Overall he is doing well.  He denies any new issues with his machine.  HISTORY: (Copied from Dr.Dohmeier's note) 06/24/18: [name] is a 43 y.o. male patient was last seen by his primary care physician on 10 March 2018.    The patient had suffered a DVT on December 6 and was started on Xarelto.  He wears compression hose.  He has a history of diabetes mellitus type 2 complicated by peripheral neuropathy which is poorly controlled and painful.  Dyslipidemia.  Acute pancreatitis leading to hospital admission in May 2018.   Sleep and medical history: Chronic smoker- tobacco abuse was temporarily stopped in May 2018 but he restarted.   In 2010 he was referred for an obstructive sleep apnea evaluation, begun treatment with CPAP but states now that his machine is not working well.   In August 2019 followed a hospital admission for TIA.  The stroke work-up showed that he had an occlusion of the left vertebral artery and he started on antiplatelet agents as well as Lipitor 40 mg daily.  Dr. Delia HeadyPramod Sethi is his primary neurologist.  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       Chief  complaint according to patient : "My CPAP isn't working". [name] states that he has used the machine he inherited from a family member this machine was never set to his specific pressure needs.  He has purchased nasal pillows online and his nose now hurts, he cannot breathe easily and he states that he has chronic sinus congestion and rhinitis which makes the use of this kind of interface very difficult.  The pressure is too high he feels.  His sleep study was over 41 years old at the time he had an AHI of 55/h and one time he must have owned his own CPAP machine but that may have broken as well.  He states that  maybe 5 years ago another sleep study was done but the results were never given to him. This was at [facility].    Family medical and sleep history: There is no family history of obstructive sleep apnea, some close relatives of diabetes mellitus, type II.  He does not endorse a blood clotting disorder in any of his primary relatives.   Social history: The patient is separated from his wife Merry ProudBrandi, due to her substance abuse-  their 2 children live with him, he currently is a head of a household of 3 people, 2 children.   While he quit smoking briefly in 2018 he still has resumed- he smokes about 1/2 pack/day, he rarely drinks alcohol, but 3-6 caffeinated beverages  per day are consumed.    Sleep habits are as follows: [name], reports that dinnertime is fluctuating and is often as late as 10 PM or even midnight as he is working floating shifts from 3:30 PM to 2 AM, which also postpones his bedtime into the early morning hours.  Bedtime is usually around 4 AM and to sleep time is characterized by tossing and turning.  He states that he is asleep rather promptly and describes his bedroom as cool, quiet and dark.  He has a family in the background to eliminate any noises he sleeps with 2 pillows in all positions supine is his preferred sleep position.  Usually he rises around 9  AM after 4 to 5 hours of sleep he has one nocturia break. He reports headaches to be present in the morning and he describes a vice-like sensation around his head, and tenderness that also is characterized as throbbing.  After he gets his kids ready he goes back to sleep for another 2 hours.  His alarm will ring at about 10 AM.  His children have told him that he snores and he wakes up with a blanket on the floor, the bed disheveled, but he is not aware if he dreams or acts out dreams.  He attributes his restlessness to his peripheral neuropathy and the DVT in his right leg left him with a painful condition as well.      REVIEW OF SYSTEMS: Out of a complete 14 system review of symptoms, the patient complains only of the following symptoms, and all other reviewed systems are negative.  See HPI  ALLERGIES: No Known Allergies  HOME MEDICATIONS: Outpatient Medications Prior to Visit  Medication Sig Dispense Refill   amLODipine (NORVASC) 10 MG tablet Take 1 tablet (10 mg total) by mouth daily. 90 tablet 0   aspirin EC 81 MG EC tablet Take 1 tablet (81 mg total) by mouth daily. 90 tablet 0   gabapentin (NEURONTIN) 300 MG capsule Take 300 mg by mouth 2 (two) times daily.     HUMALOG KWIKPEN 100 UNIT/ML KwikPen INJECT 10 UNITS SUBCUTANEOUSLY WITH EACH MEAL     Insulin Glargine (TOUJEO SOLOSTAR [place]) Inject 6 Units into the skin 2 (two) times daily.      propranolol (INDERAL) 10 MG tablet Take 10 mg by mouth 3 (three) times daily.     XARELTO 20 MG TABS tablet Take 20 mg by mouth daily with supper.      metFORMIN (GLUCOPHAGE-XR) 500 MG 24 hr tablet Take 2 tablets (1,000 mg total) by mouth 2 (two) times daily with a meal. (Patient not taking: Reported on 10/22/2018) 120 tablet 2   pioglitazone (ACTOS) 15 MG tablet Take 15 mg by mouth daily.  6   No facility-administered medications prior to visit.     PAST MEDICAL HISTORY: Past Medical History:  Diagnosis Date   Acute pancreatitis  07/2010   Anxiety    Arthritis    "lower back and shoulders" (10/07/2017)   Chronic lower back pain    Dyslipidemia    Headache    "weekly" (10/07/2017)   Hypercholesteremia    Hypertension    MRSA (methicillin resistant staph aureus) culture positive    Obesity    Obesity    OSA on CPAP    Peripheral neuropathy    Scoliosis    Sleep apnea    "can't tolerate any of the masks" (10/07/2017)   TIA (transient ischemic attack)    Type II diabetes mellitus (  HCC)    Umbilical hernia     PAST SURGICAL HISTORY: Past Surgical History:  Procedure Laterality Date   HIP PINNING Bilateral    "they are worn out" (10/07/2017)   PICC LINE INSERTION  07/2010   "in hospital for ~ 2 months; took PICC out when I was discharged"   TESTICLE REMOVAL Right ~ 2012   "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"    FAMILY HISTORY: Family History  Problem Relation Age of Onset   Hypertension Mother    Hypertension Father     SOCIAL HISTORY: Social History   Socioeconomic History   Marital status: Legally Separated    Spouse name: Not on file   Number of children: Not on file   Years of education: Not on file   Highest education level: Not on file  Occupational History   Not on file  Social Needs   Financial resource strain: Not on file   Food insecurity    Worry: Not on file    Inability: Not on file   Transportation needs    Medical: Not on file    Non-medical: Not on file  Tobacco Use   Smoking status: Current Every Day Smoker    Packs/day: 0.50    Years: 20.00    Pack years: 10.00    Types: Cigarettes   Smokeless tobacco: Former NeurosurgeonUser    Types: Snuff   Tobacco comment: 10/07/2017 "quit years ago"  Substance and Sexual Activity   Alcohol use: Not Currently   Drug use: No   Sexual activity: Yes  Lifestyle   Physical activity    Days per week: Not on file    Minutes per session: Not on file   Stress: Not on file    Relationships   Social connections    Talks on phone: Not on file    Gets together: Not on file    Attends religious service: Not on file    Active member of club or organization: Not on file    Attends meetings of clubs or organizations: Not on file    Relationship status: Not on file   Intimate partner violence    Fear of current or ex partner: Not on file    Emotionally abused: Not on file    Physically abused: Not on file    Forced sexual activity: Not on file  Other Topics Concern   Not on file  Social History Narrative   Not on file      PHYSICAL EXAM  Vitals:   10/22/18 1044  BP: (!) 121/1  Pulse: 75  Temp: 97.8 F (36.6 C)  TempSrc: Oral  Weight: 267 lb (121.1 kg)  Height: 5\' 6"  (1.676 m)   Body mass index is 43.09 kg/m.  Generalized: Well developed, in no acute distress  Chest: Lungs clear to auscultation bilaterally  Neurological examination  Mentation: Alert oriented to time, place, history taking. Follows all commands speech and language fluent Cranial nerve II-XII: Pupils were equal round reactive to light. Extraocular movements were full, visual field were full on confrontational test.  Head turning and shoulder shrug  were normal and symmetric. Motor: The motor testing reveals 5 over 5 strength of all 4 extremities. Good symmetric motor tone is noted throughout.  Sensory: Sensory testing is intact to soft touch on all 4 extremities. No evidence of extinction is noted.  Gait and station: Gait is normal.     DIAGNOSTIC DATA (LABS, IMAGING, TESTING) - I reviewed  patient records, labs, notes, testing and imaging myself where available.  Lab Results  Component Value Date   WBC 7.6 10/07/2017   HGB 15.4 10/07/2017   HCT 43.0 10/07/2017   MCV 89.4 10/07/2017   PLT 98 (L) 10/07/2017      Component Value Date/Time   NA 140 10/07/2017 0031   K 4.3 10/07/2017 0031   CL 105 10/07/2017 0031   CO2 26 10/07/2017 0031   GLUCOSE 278 (H) 10/07/2017  0031   BUN 13 10/07/2017 0031   CREATININE 0.90 10/07/2017 0031   CALCIUM 8.5 (L) 10/07/2017 0031   PROT 6.5 10/28/2016 0305   ALBUMIN 3.6 10/28/2016 0305   AST 17 10/28/2016 0305   ALT 14 (L) 10/28/2016 0305   ALKPHOS 73 10/28/2016 0305   BILITOT 0.7 10/28/2016 0305   GFRNONAA >60 10/07/2017 0031   GFRAA >60 10/07/2017 0031   Lab Results  Component Value Date   CHOL 254 (H) 10/07/2017   HDL 36 (L) 10/07/2017   LDLCALC UNABLE TO CALCULATE IF TRIGLYCERIDE OVER 400 mg/dL 10/07/2017   LDLDIRECT 81 10/07/2017   TRIG 775 (H) 10/07/2017   CHOLHDL 7.1 10/07/2017   Lab Results  Component Value Date   HGBA1C 9.4 (H) 10/07/2017   No results found for: VITAMINB12 Lab Results  Component Value Date   TSH 1.114 07/08/2010      ASSESSMENT AND PLAN 43 y.o. year old male  has a past medical history of Acute pancreatitis (07/2010), Anxiety, Arthritis, Chronic lower back pain, Dyslipidemia, Headache, Hypercholesteremia, Hypertension, MRSA (methicillin resistant staph aureus) culture positive, Obesity, Obesity, OSA on CPAP, Peripheral neuropathy, Scoliosis, Sleep apnea, TIA (transient ischemic attack), Type II diabetes mellitus ([place]), and Umbilical hernia. here with:  1.  Obstructive sleep apnea on CPAP  The patient's CPAP download shows excellent compliance and good treatment of his apnea.  He is encouraged to continue using CPAP nightly and greater than 4 hours each night.  He is advised that if his symptoms worsen or he develops new symptoms he should let us know.  He will follow-up in 1 year or sooner if needed.    I spent 15 minutes with the patient. 50% of this time was spent reviewing CPAP download  Ward Givens, MSN, NP-C 10/22/2018, 10:50 AM [facility] [address], [place], [place] [address] [phone]

## 2019-01-04 ENCOUNTER — Other Ambulatory Visit: Payer: Self-pay

## 2019-01-04 DIAGNOSIS — Z20822 Contact with and (suspected) exposure to covid-19: Secondary | ICD-10-CM

## 2019-01-06 LAB — NOVEL CORONAVIRUS, NAA: SARS-CoV-2, NAA: NOT DETECTED

## 2019-03-11 DIAGNOSIS — M542 Cervicalgia: Secondary | ICD-10-CM | POA: Insufficient documentation

## 2019-03-24 ENCOUNTER — Telehealth: Payer: Self-pay | Admitting: *Deleted

## 2019-03-24 NOTE — Telephone Encounter (Signed)
Pt is scheduled 03/31/19 at 1425.

## 2019-03-24 NOTE — Telephone Encounter (Signed)
    Ok for repeat

## 2019-03-26 ENCOUNTER — Other Ambulatory Visit: Payer: Self-pay | Admitting: Internal Medicine

## 2019-03-26 DIAGNOSIS — M542 Cervicalgia: Secondary | ICD-10-CM

## 2019-03-31 ENCOUNTER — Other Ambulatory Visit: Payer: Self-pay

## 2019-03-31 ENCOUNTER — Ambulatory Visit: Payer: Self-pay

## 2019-03-31 ENCOUNTER — Encounter: Payer: Self-pay | Admitting: Physical Medicine and Rehabilitation

## 2019-03-31 ENCOUNTER — Ambulatory Visit (INDEPENDENT_AMBULATORY_CARE_PROVIDER_SITE_OTHER): Payer: BC Managed Care – PPO | Admitting: Physical Medicine and Rehabilitation

## 2019-03-31 DIAGNOSIS — M25552 Pain in left hip: Secondary | ICD-10-CM | POA: Diagnosis not present

## 2019-03-31 DIAGNOSIS — M7918 Myalgia, other site: Secondary | ICD-10-CM

## 2019-03-31 DIAGNOSIS — M5412 Radiculopathy, cervical region: Secondary | ICD-10-CM

## 2019-03-31 DIAGNOSIS — M542 Cervicalgia: Secondary | ICD-10-CM | POA: Diagnosis not present

## 2019-03-31 NOTE — Progress Notes (Signed)
[name] - 44 y.o. male MRN [id number]  Date of birth: 17-Jan-1976  Office Visit Note: Visit Date: 03/31/2019 PCP: Leanna Battles, MD Referred by: Leanna Battles, MD  Subjective: Chief Complaint  Patient presents with  . Left Hip - Pain   HPI: [name] is a 44 y.o. male who comes in today Evaluation management of chronic worsening severe left hip pain as well as new complaint of neck pain.  From the standpoint of his neck pain he reports that Dr. Philip Aspen his primary care physician does have MRI of the cervical spine on order.  He has been having chronic worsening neck pain with some referral pattern in the arms.  No paresthesia.  No numbness tingling in the hands.  He does have a history of diabetes complicated by morbid obesity.  He has not had any prior cervical surgery.  He has had no injections of the cervical spine.  He has had some physical therapy in the past.  Onset of pain was fairly abrupt but is been ongoing now for several months.  He rates his pain is pretty severe.  In terms of his back pain and hip pain he actually has more issues with the hips.  He has bilateral hip pain left much worse than right.  He is getting pain predominantly in the posterior aspect and anterior aspect but not really into the groin.  He had an injection in 2019 of the left hip that seemed to help quite a bit.  He thinks this is similar pain.  He denies any radicular complaints down the legs.  Again no paresthesias.  No focal weakness.  He has had no falls or other injuries.  Review of Systems  Musculoskeletal: Positive for back pain, joint pain and neck pain.  All other systems reviewed and are negative.  Otherwise per HPI.  Assessment & Plan: Visit Diagnoses:  1. Left hip pain   2. Cervicalgia   3. Cervical radiculopathy   4. 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  Exam today is consistent mainly with hip pathology does have pain with rotation very stiff in rotation.  He did well with prior injection in 2019 so we will repeat that today from a diagnostic and hopefully therapeutic standpoint.  As of note below he did get relief during the anesthetic phase.  In terms of his neck pain we will await the MRI of the cervical spine.  I am happy to see him after that MRI to review that.  I am not sure what Dr. Shon Baton plan would be but I would be happy to see him.  If it something pretty severe he may wish to see a spine surgeon.    Meds & Orders: No orders of the defined types were placed in this encounter.   Orders Placed This Encounter  Procedures  . Large Joint Inj: L hip joint  . XR C-ARM NO REPORT    Follow-up: Return if symptoms worsen or fail to improve.   Procedures: Large Joint Inj: L hip joint on 03/31/2019 2:55 PM Indications: diagnostic evaluation and pain Details: 22 G 3.5 in needle, fluoroscopy-guided anterior approach  Arthrogram: No  Medications: 4 mL bupivacaine 0.25 %; 60 mg triamcinolone acetonide 40 MG/ML Outcome: tolerated well, no immediate complications  There was excellent flow of contrast  producing a partial arthrogram of the hip. The patient did have relief of symptoms during the anesthetic phase of the injection. Procedure, treatment alternatives, risks and benefits explained, specific risks discussed. Consent was given by the patient. Immediately prior to procedure a time out was called to verify the correct patient, procedure, equipment, support staff and site/side marked as required. Patient was prepped and draped in the usual sterile fashion.      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  Joint spacing looks  fairly well maintained. There is some inferior  spurring on the right and there is a small area of ossification of the  superior lateral aspect of the acetabulum. There is no fractures or  dislocations. Sacroiliac joints appear well-maintained. Hip height seem to  be equal but there does appear to be some rotation with the left iliac  crest higher than the right. ----  MRI LUMBAR SPINE WITHOUT CONTRAST   Technique: Multiplanar and multiecho pulse sequences of the lumbar  spine were obtained without intravenous contrast.   Comparison: Plain films 06/17/2007   Findings: The sagittal MR images demonstrate normal overall  alignment of the lumbar vertebral bodies. They demonstrate normal  marrow signal. There is disc desiccation at L2-L3 and at L5-S1.  There are moderate degenerative changes for the patient's age. The  last full intervertebral the space is labeled L5-S1. This  correlates with the plain films. The conus medullaris terminates  at L1.   L1-L2: No significant findings   L2-L3: Broad-based shallow disc protrusion with flattening of the  anterior thecal sac. There is also a shallow right foraminal disc  protrusion with right foraminal encroachment.   L3-L4: Moderate sized central and right paracentral disc  protrusion with mass effect on the anterior thecal sac. Minimal  medial foraminal encroachment also   L4-L5: Moderate to large central disc protrusion with significant  mass effect on the anterior thecal sac. There is also right  lateral recess stenosis and biforaminal stenosis, right greater  than left.   L5-S1: Moderate to large central and right paracentral disc  protrusion. There is mass effect on the anterior thecal sac.  There is also a moderate sized broad-based left foraminal disc  protrusion which contacts and compresses the left L5 nerve root.  Mild right foraminal stenosis.   IMPRESSION:   1. Multilevel disc disease with specific findings as discussed   above. There are significant disc protrusions at L3-L4, L4-L5 and  L5-S1.   He reports that he has been smoking cigarettes. He has a 10.00 pack-year smoking history. He has quit using smokeless tobacco.  His smokeless tobacco use included snuff. No results for input(s): HGBA1C, LABURIC in the last 8760 hours.  Objective:  VS:  HT:    WT:   BMI:     BP:   HR: bpm  TEMP: ( )  RESP:  Physical Exam Vitals and nursing note reviewed.  Constitutional:      General: He is not in acute distress.    Appearance: Normal appearance. He is well-developed.  HENT:     Head: Normocephalic and atraumatic.  Eyes:     Conjunctiva/sclera: Conjunctivae normal.     Pupils: Pupils are equal, round, and reactive to light.  Cardiovascular:     Rate and Rhythm: Normal rate.     Pulses: Normal pulses.  Heart sounds: Normal heart sounds.  Pulmonary:     Effort: Pulmonary effort is normal. No respiratory distress.  Musculoskeletal:     Cervical back: Neck supple. Tenderness present. No rigidity.     Right lower leg: No edema.     Left lower leg: No edema.     Comments: Patient sits with forward flexed cervical spine does have pain at end ranges of rotation and extension.  Negative Spurling's test.  He does have some myofascial trigger points in the levator scapula and trapezius.  He has some shoulder impingement bilaterally.  He has good strength in the upper extremities.  Examination of both hips show stiffness with rotation he does have pain on the left in particular with rotation internally.  Lymphadenopathy:     Cervical: No cervical adenopathy.  Skin:    General: Skin is warm and dry.     Findings: No erythema or rash.  Neurological:     General: No focal deficit present.     Mental Status: He is alert and oriented to person, place, and time.     Sensory: No sensory deficit.     Coordination: Coordination normal.     Gait: Gait normal.  Psychiatric:        Mood and Affect: Mood normal.         Behavior: Behavior normal.     Ortho Exam Imaging: MR CERVICAL SPINE WO CONTRAST  Result Date: 04/21/2019 CLINICAL DATA:  Neck pain radiating into left arm EXAM: MRI CERVICAL SPINE WITHOUT CONTRAST TECHNIQUE: Multiplanar, multisequence MR imaging of the cervical spine was performed. No intravenous contrast was administered. COMPARISON:  None. FINDINGS: Alignment: Nonspecific straightening of the cervical lordosis. No significant antero posterior listhesis. Vertebrae: Vertebral body heights are maintained. No substantial marrow edema. No suspicious osseous lesion. Cord: Abnormal cord signal at C6. Posterior Fossa, vertebral arteries, paraspinal tissues: Unremarkable. Disc levels: C2-C3: Left uncovertebral and facet hypertrophy. No canal or right foraminal stenosis. Marked left foraminal stenosis. C3-C4: Disc bulge eccentric to the left with endplate osteophytes and facet and uncovertebral hypertrophy. Mild canal stenosis. Mild right and moderate to marked left foraminal stenosis. C4-C5: Disc bulge with superimposed right central extrusion extending below the disc level and left paracentral/foraminal protrusion, endplate osteophytes, and facet and uncovertebral hypertrophy. Moderate to severe canal stenosis with mild flattening of the cord. Mild right foraminal stenosis. Marked left foraminal stenosis C5-C6: Disc bulge with endplate osteophytes and facet uncovertebral hypertrophy. Severe canal stenosis with cord compression. Marked foraminal stenosis. C6-C7: Disc bulge with superimposed right paracentral disc osteophyte complex and uncovertebral and facet hypertrophy. Moderate to severe canal stenosis with flattening of the right ventral cord. Moderate to marked foraminal stenosis. C7-T1:  Disc bulge.  No significant canal or foraminal stenosis. IMPRESSION: Multilevel degenerative changes as detailed above. There is significant canal stenosis from C4-C5 through C6-C7. Cord compression is present at C5-C6.  Abnormal cord signal is present reflecting myelomalacia or edema. Significant multilevel foraminal stenosis, greater on the left. Surgical referral is recommended. These results will be called to the ordering clinician or representative by the Radiologist Assistant, and communication documented in the PACS or zVision Dashboard. Electronically Signed   By: Guadlupe Spanish [name]   On: 04/21/2019 09:52    Past Medical/Family/Surgical/Social History: Medications & Allergies reviewed per EMR, new medications updated. Patient Active Problem List   Diagnosis Date Noted  . Thrombocytopenia, unspecified (HCC) 07/03/2018  . Type 2 diabetes mellitus without complication (HCC) 07/03/2018  . OSA (obstructive sleep apnea)  07/03/2018  . Excessive daytime sleepiness 07/03/2018  . Class 3 severe obesity due to excess calories with serious comorbidity and body mass index (BMI) of 40.0 to 44.9 in adult (HCC) 07/03/2018  . Non-seasonal allergic rhinitis due to pollen 07/03/2018  . TIA (transient ischemic attack) 10/07/2017   Past Medical History:  Diagnosis Date  . Acute pancreatitis 07/2010  . Anxiety   . Arthritis    "lower back and shoulders" (10/07/2017)  . Chronic lower back pain   . Dyslipidemia   . Headache    "weekly" (10/07/2017)  . Hypercholesteremia   . Hypertension   . MRSA (methicillin resistant staph aureus) culture positive   . Obesity   . Obesity   . OSA on CPAP   . Peripheral neuropathy   . Scoliosis   . Sleep apnea    "can't tolerate any of the masks" (10/07/2017)  . TIA (transient ischemic attack)   . Type II diabetes mellitus (HCC)   . Umbilical hernia    Family History  Problem Relation Age of Onset  . Hypertension Mother   . Hypertension Father    Past Surgical History:  Procedure Laterality Date  . HIP PINNING Bilateral    "they are worn out" (10/07/2017)  . PICC LINE INSERTION  07/2010   "in hospital for ~ 2 months; took PICC out when I was discharged"  . TESTICLE REMOVAL  Right ~ 2012   "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"   Social History   Occupational History  . Not on file  Tobacco Use  . Smoking status: Current Every Day Smoker    Packs/day: 0.50    Years: 20.00    Pack years: 10.00    Types: Cigarettes  . Smokeless tobacco: Former Neurosurgeon    Types: Snuff  . Tobacco comment: 10/07/2017 "quit years ago"  Substance and Sexual Activity  . Alcohol use: Not Currently  . Drug use: No  . Sexual activity: Yes

## 2019-03-31 NOTE — Progress Notes (Signed)
 .  Numeric Pain Rating Scale and Functional Assessment Average Pain 8   In the last MONTH (on 0-10 scale) has pain interfered with the following?  1. General activity like being  able to carry out your everyday physical activities such as walking, climbing stairs, carrying groceries, or moving a chair?  Rating(9)    -Dye Allergies.  

## 2019-04-20 ENCOUNTER — Other Ambulatory Visit: Payer: Self-pay

## 2019-04-20 ENCOUNTER — Ambulatory Visit
Admission: RE | Admit: 2019-04-20 | Discharge: 2019-04-20 | Disposition: A | Discharge status: Home / Self Care | Payer: BC Managed Care – PPO | Source: Ambulatory Visit | Attending: Internal Medicine | Admitting: Internal Medicine

## 2019-04-20 DIAGNOSIS — M542 Cervicalgia: Secondary | ICD-10-CM

## 2019-04-21 ENCOUNTER — Telehealth: Payer: Self-pay | Admitting: Physical Medicine and Rehabilitation

## 2019-04-21 MED ORDER — TRIAMCINOLONE ACETONIDE 40 MG/ML IJ SUSP
60.0000 mg | INTRAMUSCULAR | Status: AC | PRN
  Administered 2019-03-31: 60 mg via INTRA_ARTICULAR

## 2019-04-21 MED ORDER — BUPIVACAINE HCL 0.25 % IJ SOLN
4.0000 mL | INTRAMUSCULAR | Status: AC | PRN
  Administered 2019-03-31: 4 mL via INTRA_ARTICULAR

## 2019-04-21 NOTE — Telephone Encounter (Signed)
Left message #1

## 2019-04-21 NOTE — Telephone Encounter (Signed)
Called patient to advise  °

## 2019-04-21 NOTE — Telephone Encounter (Signed)
Please let him know that we can do the hip injection.  Also let him know that I did look at the MRI of the cervical spine and he does have a pretty severe issue with stenosis or narrowing of the central canal and it does impact the cord.  There is some signal change in the cord and this really does need to be looked at by a neurosurgeon.  Dr. Eloise Harman may want to make that referral.

## 2019-04-30 ENCOUNTER — Other Ambulatory Visit: Payer: Self-pay

## 2019-04-30 ENCOUNTER — Ambulatory Visit: Payer: Self-pay

## 2019-04-30 ENCOUNTER — Encounter: Payer: Self-pay | Admitting: Physical Medicine and Rehabilitation

## 2019-04-30 ENCOUNTER — Ambulatory Visit: Payer: BC Managed Care – PPO | Admitting: Physical Medicine and Rehabilitation

## 2019-04-30 DIAGNOSIS — M25551 Pain in right hip: Secondary | ICD-10-CM | POA: Diagnosis not present

## 2019-04-30 DIAGNOSIS — M93001 Unspecified slipped upper femoral epiphysis (nontraumatic), right hip: Secondary | ICD-10-CM

## 2019-04-30 NOTE — Progress Notes (Signed)
[name] - 44 y.o. male MRN [id number]  Date of birth: [birth year]  Office Visit Note: Visit Date: 04/30/2019 PCP: Leanna Battles, MD Referred by: Leanna Battles, MD  Subjective: Chief Complaint  Patient presents with  . Right Hip - Pain   HPI:  [name] is a 44 y.o. male who comes in today For planned right intra-articular hip injection fluoroscopic guidance.  Patient was seen a few weeks ago for left hip injection he did quite well with that.  He is having some right-sided hip and groin pain.  Please see our prior notes for further details and justification.  He has had good relief with these in the past.  I also reviewed cervical MRI with the patient.  He had talked to me about this the last time I saw him.  Since have seen him he has followed up with Dr. Duffy Rhody at [facility].  He does have pretty severe stenosis of the cervical spine and is going to undergo surgery.  ROS Otherwise per HPI.  Assessment & Plan: Visit Diagnoses:  1. Pain in right hip   2. 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   Meds & Orders: No orders of the defined types were placed in this encounter.   Orders Placed This Encounter  Procedures  . Large Joint Inj: R hip joint  . XR C-ARM NO REPORT    Follow-up: Return if symptoms worsen or fail to improve.   Procedures: Large Joint Inj: R hip joint (Right) on 04/30/2019 10:08 AM Indications: diagnostic evaluation and pain Details: 22 G 3.5 in needle, fluoroscopy-guided anterior approach  Arthrogram: No  Medications: 4 mL bupivacaine 0.25 %; 60 mg triamcinolone acetonide 40 MG/ML Outcome: tolerated well, no immediate complications  There was excellent flow of contrast producing a partial arthrogram of the hip. The patient did have relief of symptoms during the anesthetic phase of the injection. Procedure, treatment alternatives, risks and benefits explained,  specific risks discussed. Consent was given by the patient. Immediately prior to procedure a time out was called to verify the correct patient, procedure, equipment, support staff and site/side marked as required. Patient was prepped and draped in the usual sterile fashion.      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  Joint spacing looks fairly well maintained. There is some inferior  spurring on the right and there is a small area of ossification of the  superior lateral aspect of the acetabulum. There is no fractures or  dislocations. Sacroiliac joints appear well-maintained. Hip height seem to  be equal but there does appear to be some rotation with the left iliac  crest higher than the right. ----  MRI LUMBAR SPINE WITHOUT CONTRAST   Technique: Multiplanar and multiecho pulse sequences of the lumbar  spine were obtained without intravenous contrast.   Comparison: Plain films 06/17/2007   Findings: The sagittal MR images demonstrate normal overall  alignment of the lumbar vertebral bodies. They demonstrate normal  marrow signal. There is disc desiccation at L2-L3 and at L5-S1.  There are moderate degenerative changes for the patient's age. The  last full intervertebral the space is labeled L5-S1. This  correlates with the plain films. The conus medullaris terminates  at L1.   L1-L2: No significant findings  L2-L3: Broad-based shallow disc protrusion with flattening of the  anterior thecal sac. There is also a shallow right foraminal disc  protrusion with right foraminal encroachment.   L3-L4: Moderate sized central and right paracentral disc  protrusion with mass effect on the anterior thecal sac. Minimal  medial foraminal encroachment also   L4-L5: Moderate to large central disc protrusion with significant  mass effect on  the anterior thecal sac. There is also right  lateral recess stenosis and biforaminal stenosis, right greater  than left.   L5-S1: Moderate to large central and right paracentral disc  protrusion. There is mass effect on the anterior thecal sac.  There is also a moderate sized broad-based left foraminal disc  protrusion which contacts and compresses the left L5 nerve root.  Mild right foraminal stenosis.   IMPRESSION:   1. Multilevel disc disease with specific findings as discussed  above. There are significant disc protrusions at L3-L4, L4-L5 and  L5-S1.     Objective:  VS:  HT:    WT:   BMI:     BP:   HR: bpm  TEMP: ( )  RESP:  Physical Exam  Ortho Exam Imaging: No results found.

## 2019-04-30 NOTE — Progress Notes (Signed)
.  Numeric Pain Rating Scale and Functional Assessment Average Pain 4   In the last MONTH (on 0-10 scale) has pain interfered with the following?  1. General activity like being  able to carry out your everyday physical activities such as walking, climbing stairs, carrying groceries, or moving a chair?  Rating(5)    -Dye Allergies.  

## 2019-05-03 MED ORDER — TRIAMCINOLONE ACETONIDE 40 MG/ML IJ SUSP
60.0000 mg | INTRAMUSCULAR | Status: AC | PRN
  Administered 2019-04-30: 10:00:00 60 mg via INTRA_ARTICULAR

## 2019-05-03 MED ORDER — BUPIVACAINE HCL 0.25 % IJ SOLN
4.0000 mL | INTRAMUSCULAR | Status: AC | PRN
  Administered 2019-04-30: 10:00:00 4 mL via INTRA_ARTICULAR

## 2019-05-05 ENCOUNTER — Other Ambulatory Visit: Payer: Self-pay | Admitting: Neurosurgery

## 2019-05-07 ENCOUNTER — Other Ambulatory Visit: Payer: Self-pay | Admitting: *Deleted

## 2019-05-07 DIAGNOSIS — M4802 Spinal stenosis, cervical region: Secondary | ICD-10-CM

## 2019-05-07 DIAGNOSIS — G992 Myelopathy in diseases classified elsewhere: Secondary | ICD-10-CM

## 2019-05-17 ENCOUNTER — Other Ambulatory Visit: Payer: Self-pay | Admitting: Neurosurgery

## 2019-05-18 ENCOUNTER — Ambulatory Visit
Admission: RE | Admit: 2019-05-18 | Discharge: 2019-05-18 | Disposition: A | Discharge status: Home / Self Care | Payer: BC Managed Care – PPO | Source: Ambulatory Visit | Attending: Neurosurgery | Admitting: Neurosurgery

## 2019-05-18 ENCOUNTER — Other Ambulatory Visit: Payer: Self-pay

## 2019-05-18 DIAGNOSIS — G992 Myelopathy in diseases classified elsewhere: Secondary | ICD-10-CM

## 2019-05-18 DIAGNOSIS — M4802 Spinal stenosis, cervical region: Secondary | ICD-10-CM

## 2019-05-21 NOTE — Progress Notes (Signed)
[place] ([address] [place] [address] [place] ([place]) [place] [address] Phone: [phone] Fax: [phone]  [id number] - [place], [place] [address] [place] [address] Phone: [phone] Fax: [phone]      Your procedure is scheduled on Wednesday, May 26, 2019.  Report to [facility] Main Entrance "A" at 5:30 A.M., and check in at the Admitting office.  Call this number if you have problems the morning of surgery:  [phone]  Call [phone] if you have any questions prior to your surgery date Monday-Friday 8am-4pm    Remember:  Do not eat or drink after midnight the night before your surgery    Take these medicines the morning of surgery with A SIP OF WATER:  amLODipine (NORVASC) gabapentin (NEURONTIN) propranolol (INDERAL) methocarbamol (ROBAXIN) - if needed    As of today, stop taking all Aspirin (unless instructed by your doctor) and other Aspirin containing products, Vitamins, Fish Oils, and Herbal Medications. Also stop all NSAIDS i.e. Advil, Ibuprofen, Motrin, Aleve, Anaprox, Naproxen, BC, Goody Powders, and all Supplements.   WHAT DO I DO ABOUT MY DIABETES MEDICATION?   Marland Kitchen Do not take oral diabetes medicines (metFORMIN (GLUCOPHAGE-XR) or pioglitazone (ACTOS)) the morning of surgery. . Do not take [place] the night before surgery or the morning of surgery.  . THE NIGHT BEFORE SURGERY, take ___30___ units of _Insulin Glargine (TOUJEO SOLOSTAR [place])__insulin.       . THE MORNING OF SURGERY, take ____30___ units of __Insulin Glargine (Niles Lerna)__insulin.   HOW TO MANAGE YOUR DIABETES BEFORE AND AFTER SURGERY  Why is it important to control my blood sugar before and after surgery? . Improving blood sugar levels before and after surgery helps healing and can limit problems. . A way of improving blood sugar control  is eating a healthy diet by: o  Eating less sugar and carbohydrates o  Increasing activity/exercise o  Talking with your doctor about reaching your blood sugar goals . High blood sugars (greater than 180 mg/dL) can raise your risk of infections and slow your recovery, so you will need to focus on controlling your diabetes during the weeks before surgery. . Make sure that the doctor who takes care of your diabetes knows about your planned surgery including the date and location.  How do I manage my blood sugar before surgery? . Check your blood sugar at least 4 times a day, starting 2 days before surgery, to make sure that the level is not too high or low. . Check your blood sugar the morning of your surgery when you wake up and every 2 hours until you get to the Short Stay unit. o If your blood sugar is less than 70 mg/dL, you will need to treat for low blood sugar: - Do not take insulin. - Treat a low blood sugar (less than 70 mg/dL) with  cup of clear juice (cranberry or apple), 4 glucose tablets, OR glucose gel. - Recheck blood sugar in 15 minutes after treatment (to make sure it is greater than 70 mg/dL). If your blood sugar is not greater than 70 mg/dL on recheck, call [phone] for further instructions. . Report your blood sugar to the short stay nurse when you get to Short Stay.  . If you are admitted to the hospital after surgery: o Your blood sugar will be checked by the staff and you will probably  be given insulin after surgery (instead of oral diabetes medicines) to make sure you have good blood sugar levels. o The goal for blood sugar control after surgery is 80-180 mg/dL.    No Smoking of any kind, Tobacco, or Alcohol products 24 hours prior to your procedure. If you use a CPAP at night, you may bring all equipment for your overnight stay.                        Do not wear jewelry.            Do not wear lotions, powders, colognes, or deodorant.            Do not shave 48  hours prior to surgery.  Men may shave face and neck.            Do not bring valuables to the hospital.            [facility] is not responsible for any belongings or valuables.   Contacts, glasses, dentures or bridgework may not be worn into surgery.      For patients admitted to the hospital, discharge time will be determined by your treatment team.   Patients discharged the day of surgery will not be allowed to drive home, and someone needs to stay with them for 24 hours.    Special instructions:   - Preparing For Surgery  Before surgery, you can play an important role. Because skin is not sterile, your skin needs to be as free of germs as possible. You can reduce the number of germs on your skin by washing with CHG (chlorahexidine gluconate) Soap before surgery.  CHG is an antiseptic cleaner which kills germs and bonds with the skin to continue killing germs even after washing.    Oral Hygiene is also important to reduce your risk of infection.  Remember - BRUSH YOUR TEETH THE MORNING OF SURGERY WITH YOUR REGULAR TOOTHPASTE  Please do not use if you have an allergy to CHG or antibacterial soaps. If your skin becomes reddened/irritated stop using the CHG.  Do not shave (including legs and underarms) for at least 48 hours prior to first CHG shower. It is OK to shave your face.  Please follow these instructions carefully.   1. Shower the NIGHT BEFORE SURGERY and the MORNING OF SURGERY with CHG Soap.   2. If you chose to wash your hair, wash your hair first as usual with your normal shampoo.  3. After you shampoo, rinse your hair and body thoroughly to remove the shampoo.  4. Use CHG as you would any other liquid soap. You can apply CHG directly to the skin and wash gently with a scrungie or a clean washcloth.   5. Apply the CHG Soap to your body ONLY FROM THE NECK DOWN.  Do not use on open wounds or open sores. Avoid contact with your eyes, ears, mouth and genitals  (private parts). Wash Face and genitals (private parts)  with your normal soap.   6. Wash thoroughly, paying special attention to the area where your surgery will be performed.  7. Thoroughly rinse your body with warm water from the neck down.  8. DO NOT shower/wash with your normal soap after using and rinsing off the CHG Soap.  9. Pat yourself dry with a CLEAN TOWEL.  10. Wear CLEAN PAJAMAS to bed the night before surgery, wear comfortable clothes the morning of surgery  11. Place  CLEAN SHEETS on your bed the night of your first shower and DO NOT SLEEP WITH PETS.   Day of Surgery:   Do not apply any deodorants/lotions.  Please wear clean clothes to the hospital/surgery center.   Remember to brush your teeth WITH YOUR REGULAR TOOTHPASTE.   Please read over the following fact sheets that you were given.

## 2019-05-24 ENCOUNTER — Other Ambulatory Visit (HOSPITAL_COMMUNITY)
Admission: RE | Admit: 2019-05-24 | Discharge: 2019-05-24 | Disposition: A | Discharge status: Home / Self Care | Payer: BC Managed Care – PPO | Source: Ambulatory Visit | Attending: Neurosurgery | Admitting: Neurosurgery

## 2019-05-24 ENCOUNTER — Encounter (HOSPITAL_COMMUNITY): Payer: Self-pay

## 2019-05-24 ENCOUNTER — Encounter (HOSPITAL_COMMUNITY)
Admission: RE | Admit: 2019-05-24 | Discharge: 2019-05-24 | Disposition: A | Discharge status: Home / Self Care | Payer: BC Managed Care – PPO | Source: Ambulatory Visit | Attending: Neurosurgery | Admitting: Neurosurgery

## 2019-05-24 ENCOUNTER — Other Ambulatory Visit: Payer: Self-pay

## 2019-05-24 DIAGNOSIS — D696 Thrombocytopenia, unspecified: Secondary | ICD-10-CM | POA: Insufficient documentation

## 2019-05-24 DIAGNOSIS — Z01818 Encounter for other preprocedural examination: Secondary | ICD-10-CM | POA: Diagnosis not present

## 2019-05-24 DIAGNOSIS — E119 Type 2 diabetes mellitus without complications: Secondary | ICD-10-CM | POA: Diagnosis not present

## 2019-05-24 DIAGNOSIS — I444 Left anterior fascicular block: Secondary | ICD-10-CM | POA: Diagnosis not present

## 2019-05-24 DIAGNOSIS — G4733 Obstructive sleep apnea (adult) (pediatric): Secondary | ICD-10-CM | POA: Diagnosis not present

## 2019-05-24 DIAGNOSIS — Z7902 Long term (current) use of antithrombotics/antiplatelets: Secondary | ICD-10-CM | POA: Diagnosis not present

## 2019-05-24 DIAGNOSIS — Z20822 Contact with and (suspected) exposure to covid-19: Secondary | ICD-10-CM | POA: Insufficient documentation

## 2019-05-24 DIAGNOSIS — M4800 Spinal stenosis, site unspecified: Secondary | ICD-10-CM | POA: Insufficient documentation

## 2019-05-24 DIAGNOSIS — Z8673 Personal history of transient ischemic attack (TIA), and cerebral infarction without residual deficits: Secondary | ICD-10-CM | POA: Insufficient documentation

## 2019-05-24 DIAGNOSIS — Z7982 Long term (current) use of aspirin: Secondary | ICD-10-CM | POA: Insufficient documentation

## 2019-05-24 HISTORY — DX: Cerebral infarction, unspecified: I63.9

## 2019-05-24 LAB — CBC
HCT: 48.2 % (ref 39.0–52.0)
Hemoglobin: 15.6 g/dL (ref 13.0–17.0)
MCH: 31.5 pg (ref 26.0–34.0)
MCHC: 32.4 g/dL (ref 30.0–36.0)
MCV: 97.4 fL (ref 80.0–100.0)
Platelets: 107 10*3/uL — ABNORMAL LOW (ref 150–400)
RBC: 4.95 MIL/uL (ref 4.22–5.81)
RDW: 13.1 % (ref 11.5–15.5)
WBC: 7.6 10*3/uL (ref 4.0–10.5)
nRBC: 0 % (ref 0.0–0.2)

## 2019-05-24 LAB — SURGICAL PCR SCREEN
MRSA, PCR: NEGATIVE
Staphylococcus aureus: NEGATIVE

## 2019-05-24 LAB — BASIC METABOLIC PANEL
Anion gap: 7 (ref 5–15)
BUN: 17 mg/dL (ref 6–20)
CO2: 27 mmol/L (ref 22–32)
Calcium: 8.2 mg/dL — ABNORMAL LOW (ref 8.9–10.3)
Chloride: 106 mmol/L (ref 98–111)
Creatinine, Ser: 0.73 mg/dL (ref 0.61–1.24)
GFR calc Af Amer: >60 mL/min (ref >60)
GFR calc non Af Amer: >60 mL/min (ref >60)
Glucose, Bld: 120 mg/dL — ABNORMAL HIGH (ref 70–99)
Potassium: 4.2 mmol/L (ref 3.5–5.1)
Sodium: 140 mmol/L (ref 135–145)

## 2019-05-24 LAB — TYPE AND SCREEN
ABO/RH(D): A POS
Antibody Screen: NEGATIVE

## 2019-05-24 LAB — GLUCOSE, CAPILLARY: Glucose-Capillary: 141 mg/dL — ABNORMAL HIGH (ref 70–99)

## 2019-05-24 LAB — ABO/RH: ABO/RH(D): A POS

## 2019-05-24 LAB — HEMOGLOBIN A1C
Hgb A1c MFr Bld: 7.5 % — ABNORMAL HIGH (ref 4.8–5.6)
Mean Plasma Glucose: 168.55 mg/dL

## 2019-05-24 NOTE — Progress Notes (Signed)
PCP - Dr. Jarome Matin Cardiologist - Denies Ortho: Dr. Weston Settle  PPM/ICD - Denies  Chest x-ray - N/A EKG - 05/24/19 Stress Test - Denies ECHO - 10/07/17 Cardiac Cath - Denies  Sleep Study - 08/05/18 CPAP - Yes  Fasting Blood Sugar - 55-130 Checks Blood Sugar __8+___ times a day; Pt has glucose skin patch to check sugars.  Blood Thinner Instructions: N/A Aspirin Instructions: N/A  ERAS Protcol - No  COVID TEST- 05/24/19   Coronavirus Screening  Have you experienced the following symptoms:  Cough yes/no: No Fever (>100.30F)  yes/no: No Runny nose yes/no: No Sore throat yes/no: No Difficulty breathing/shortness of breath  yes/no: No  Have you or a family member traveled in the last 14 days and where? yes/no: No   If the patient indicates "YES" to the above questions, their PAT will be rescheduled to limit the exposure to others and, the surgeon will be notified. THE PATIENT WILL NEED TO BE ASYMPTOMATIC FOR 14 DAYS.   If the patient is not experiencing any of these symptoms, the PAT nurse will instruct them to NOT bring anyone with them to their appointment since they may have these symptoms or traveled as well.   Please remind your patients and families that hospital visitation restrictions are in effect and the importance of the restrictions.     Anesthesia review: Yes, cardiac hx; abnormal EKG  Patient denies shortness of breath, fever, cough and chest pain at PAT appointment   All instructions explained to the patient, with a verbal understanding of the material. Patient agrees to go over the instructions while at home for a better understanding. Patient also instructed to self quarantine after being tested for COVID-19. The opportunity to ask questions was provided.

## 2019-05-25 LAB — SARS CORONAVIRUS 2 (TAT 6-24 HRS): SARS Coronavirus 2: NEGATIVE

## 2019-05-25 NOTE — Progress Notes (Signed)
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 Per note 10/22/18 he has excellent CPAP compliance.   Chronic thrombocytopenia. Per discharge summary from admission 8/19 for TIA, "Patient with chronic cytopenia for the past 7 years without s/sx of bleeding and maintained RBC and leukocyte counts. 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   Clearance from PCP Dr. Eloise Harman states pt is moderate risk and can hold ASA 10d and Xarelto 5d. Copy on chart.  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 Platelets 107k consistent with his history.   EKG 05/24/19: Normal sinus rhythm. Rate 67. Left anterior fascicular block, No significant change since last tracinng  CT cervical spine 05/18/19: IMPRESSION: 1. Overall findings appear similar to recent MRI without definite acute superimposed process. 2. Multilevel spondylosis, most advanced at C5-6 and C6-7. At both levels, there is severe spinal stenosis with cord flattening and moderate to severe osseous foraminal narrowing as detailed above. 3. Findings at C4-5 are less well seen by CT, including cord flattening with moderate to severe left foraminal narrowing. 4. Moderate osseous foraminal narrowing on the left at C2-3 and C3-4.   TTE 10/07/17: - Left ventricle: The cavity size was normal. Wall thickness was  increased in a pattern of moderate LVH. Systolic function was  normal. The estimated ejection fraction was in the range of 60%  to 65%. Wall motion was normal; there were no regional wall  motion abnormalities. Doppler parameters are consistent with  abnormal left ventricular relaxation (grade 1 diastolic   dysfunction).  - Aorta: Aortic root dimension: 43 mm (ED).  - Ascending aorta: The ascending aorta was mildly dilated.  - Left atrium: The atrium was mildly dilated.   Impressions:   - No cardiac source of emboli was indentified.    Zannie Cove [facility] Short Stay Center/Anesthesiology Phone [phone] 05/25/2019 9:53 AM'

## 2019-05-25 NOTE — Anesthesia Preprocedure Evaluation (Addendum)
Anesthesia Evaluation  Patient identified by MRN, date of birth, ID band Patient awake    Reviewed: Allergy & Precautions, H&P , NPO status , Patient's Chart, lab work & pertinent test results  Airway Mallampati: II   Neck ROM: full    Dental   Pulmonary sleep apnea , Current Smoker and Patient abstained from smoking.,    breath sounds clear to auscultation       Cardiovascular hypertension,  Rhythm:regular Rate:Normal     Neuro/Psych  Headaches, PSYCHIATRIC DISORDERS Anxiety TIACVA    GI/Hepatic   Endo/Other  diabetes, Type 2Morbid obesity  Renal/GU      Musculoskeletal  (+) Arthritis ,   Abdominal   Peds  Hematology   Anesthesia Other Findings   Reproductive/Obstetrics                            Anesthesia Physical Anesthesia Plan  ASA: III  Anesthesia Plan: General   Post-op Pain Management:    Induction: Intravenous  PONV Risk Score and Plan: 1 and Ondansetron, Dexamethasone, Midazolam and Treatment may vary due to age or medical condition  Airway Management Planned: Oral ETT  Additional Equipment:   Intra-op Plan:   Post-operative Plan: Extubation in OR  Informed Consent: I have reviewed the patients History and Physical, chart, labs and discussed the procedure including the risks, benefits and alternatives for the proposed anesthesia with the patient or authorized representative who has indicated his/her understanding and acceptance.       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 Per note 10/22/18 he has excellent CPAP compliance.   Chronic thrombocytopenia. Per discharge summary from admission 8/19 for TIA, "Patient with chronic cytopenia for the past 7 years without s/sx of bleeding and maintained RBC and  leukocyte counts. 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   Clearance from PCP Dr. Philip Aspen states pt is moderate risk and can hold ASA 10d and Xarelto 5d. Copy on chart.  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 Platelets 107k consistent with his history.   EKG 05/24/19: Normal sinus rhythm. Rate 67. Left anterior fascicular block, No significant change since last tracinng  CT cervical spine 05/18/19: IMPRESSION: 1. Overall findings appear similar to recent MRI without definite acute superimposed process. 2. Multilevel spondylosis, most advanced at C5-6 and C6-7. At both levels, there is severe spinal stenosis with cord flattening and moderate to severe osseous foraminal narrowing as detailed above. 3. Findings at C4-5 are less well seen by CT, including cord flattening with moderate to severe left foraminal narrowing. 4. Moderate osseous foraminal narrowing on the left at C2-3 and C3-4.   TTE 10/07/17: - Left ventricle: The cavity size was normal. Wall thickness was  increased in a pattern of moderate LVH. Systolic function was  normal. The estimated ejection fraction was in the range of 60%  to 65%. Wall motion was normal; there were no regional wall  motion abnormalities. Doppler parameters are consistent with  abnormal left ventricular relaxation (grade 1 diastolic  dysfunction).  - Aorta: Aortic root dimension: 43 mm (ED).  - Ascending aorta: The  ascending aorta was mildly dilated.  - Left atrium: The atrium was mildly dilated.   Impressions:   - No cardiac source of emboli was indentified.  )       Anesthesia Quick Evaluation

## 2019-05-26 ENCOUNTER — Inpatient Hospital Stay (HOSPITAL_COMMUNITY): Payer: BC Managed Care – PPO

## 2019-05-26 ENCOUNTER — Inpatient Hospital Stay (HOSPITAL_COMMUNITY)
Admission: RE | Admit: 2019-05-26 | Discharge: 2019-05-29 | DRG: 472 | Disposition: A | Discharge status: Home / Self Care | Payer: BC Managed Care – PPO | Attending: Neurosurgery | Admitting: Neurosurgery

## 2019-05-26 ENCOUNTER — Encounter (HOSPITAL_COMMUNITY): Payer: Self-pay

## 2019-05-26 ENCOUNTER — Encounter (HOSPITAL_COMMUNITY)
Admission: RE | Disposition: A | Discharge status: Home / Self Care | Payer: Self-pay | Source: Home / Self Care | Attending: Neurosurgery

## 2019-05-26 ENCOUNTER — Inpatient Hospital Stay (HOSPITAL_COMMUNITY): Payer: BC Managed Care – PPO | Admitting: Certified Registered Nurse Anesthetist

## 2019-05-26 ENCOUNTER — Other Ambulatory Visit: Payer: Self-pay

## 2019-05-26 ENCOUNTER — Inpatient Hospital Stay (HOSPITAL_COMMUNITY): Payer: BC Managed Care – PPO | Admitting: Physician Assistant

## 2019-05-26 DIAGNOSIS — Z794 Long term (current) use of insulin: Secondary | ICD-10-CM | POA: Diagnosis not present

## 2019-05-26 DIAGNOSIS — G992 Myelopathy in diseases classified elsewhere: Secondary | ICD-10-CM | POA: Diagnosis present

## 2019-05-26 DIAGNOSIS — Z8673 Personal history of transient ischemic attack (TIA), and cerebral infarction without residual deficits: Secondary | ICD-10-CM | POA: Diagnosis not present

## 2019-05-26 DIAGNOSIS — Z79899 Other long term (current) drug therapy: Secondary | ICD-10-CM

## 2019-05-26 DIAGNOSIS — I10 Essential (primary) hypertension: Secondary | ICD-10-CM | POA: Diagnosis present

## 2019-05-26 DIAGNOSIS — G4733 Obstructive sleep apnea (adult) (pediatric): Secondary | ICD-10-CM | POA: Diagnosis present

## 2019-05-26 DIAGNOSIS — E785 Hyperlipidemia, unspecified: Secondary | ICD-10-CM | POA: Diagnosis present

## 2019-05-26 DIAGNOSIS — E11649 Type 2 diabetes mellitus with hypoglycemia without coma: Secondary | ICD-10-CM | POA: Diagnosis not present

## 2019-05-26 DIAGNOSIS — M545 Low back pain: Secondary | ICD-10-CM | POA: Diagnosis present

## 2019-05-26 DIAGNOSIS — F17210 Nicotine dependence, cigarettes, uncomplicated: Secondary | ICD-10-CM | POA: Diagnosis present

## 2019-05-26 DIAGNOSIS — F419 Anxiety disorder, unspecified: Secondary | ICD-10-CM | POA: Diagnosis present

## 2019-05-26 DIAGNOSIS — M19011 Primary osteoarthritis, right shoulder: Secondary | ICD-10-CM | POA: Diagnosis present

## 2019-05-26 DIAGNOSIS — Z6841 Body Mass Index (BMI) 40.0 and over, adult: Secondary | ICD-10-CM | POA: Diagnosis not present

## 2019-05-26 DIAGNOSIS — M47816 Spondylosis without myelopathy or radiculopathy, lumbar region: Secondary | ICD-10-CM | POA: Diagnosis present

## 2019-05-26 DIAGNOSIS — M19012 Primary osteoarthritis, left shoulder: Secondary | ICD-10-CM | POA: Diagnosis present

## 2019-05-26 DIAGNOSIS — Z888 Allergy status to other drugs, medicaments and biological substances status: Secondary | ICD-10-CM

## 2019-05-26 DIAGNOSIS — E1142 Type 2 diabetes mellitus with diabetic polyneuropathy: Secondary | ICD-10-CM | POA: Diagnosis present

## 2019-05-26 DIAGNOSIS — Z419 Encounter for procedure for purposes other than remedying health state, unspecified: Secondary | ICD-10-CM

## 2019-05-26 DIAGNOSIS — G8929 Other chronic pain: Secondary | ICD-10-CM | POA: Diagnosis present

## 2019-05-26 DIAGNOSIS — Z8614 Personal history of Methicillin resistant Staphylococcus aureus infection: Secondary | ICD-10-CM

## 2019-05-26 DIAGNOSIS — G839 Paralytic syndrome, unspecified: Secondary | ICD-10-CM | POA: Diagnosis present

## 2019-05-26 DIAGNOSIS — M419 Scoliosis, unspecified: Secondary | ICD-10-CM | POA: Diagnosis present

## 2019-05-26 DIAGNOSIS — M542 Cervicalgia: Secondary | ICD-10-CM | POA: Diagnosis present

## 2019-05-26 DIAGNOSIS — M4802 Spinal stenosis, cervical region: Principal | ICD-10-CM | POA: Diagnosis present

## 2019-05-26 DIAGNOSIS — Z09 Encounter for follow-up examination after completed treatment for conditions other than malignant neoplasm: Secondary | ICD-10-CM

## 2019-05-26 HISTORY — PX: POSTERIOR CERVICAL FUSION/FORAMINOTOMY: SHX5038

## 2019-05-26 LAB — GLUCOSE, CAPILLARY
Glucose-Capillary: 148 mg/dL — ABNORMAL HIGH (ref 70–99)
Glucose-Capillary: 209 mg/dL — ABNORMAL HIGH (ref 70–99)
Glucose-Capillary: 258 mg/dL — ABNORMAL HIGH (ref 70–99)
Glucose-Capillary: 147 mg/dL — ABNORMAL HIGH (ref 70–99)

## 2019-05-26 SURGERY — POSTERIOR CERVICAL FUSION/FORAMINOTOMY LEVEL 5
Anesthesia: General | Site: Spine Cervical

## 2019-05-26 MED ORDER — THROMBIN 5000 UNITS EX SOLR
CUTANEOUS | Status: AC
  Filled 2019-05-26: qty 10000

## 2019-05-26 MED ORDER — INSULIN ASPART 100 UNIT/ML ~~LOC~~ SOLN
0.0000 [IU] | Freq: Three times a day (TID) | SUBCUTANEOUS | Status: DC
  Administered 2019-05-26: 11 [IU] via SUBCUTANEOUS
  Administered 2019-05-29: 3 [IU] via SUBCUTANEOUS

## 2019-05-26 MED ORDER — BUPIVACAINE HCL (PF) 0.5 % IJ SOLN
INTRAMUSCULAR | Status: AC
  Filled 2019-05-26: qty 30

## 2019-05-26 MED ORDER — FENTANYL CITRATE (PF) 250 MCG/5ML IJ SOLN
INTRAMUSCULAR | Status: DC | PRN
  Administered 2019-05-26 (×3): 50 ug via INTRAVENOUS
  Administered 2019-05-26: 150 ug via INTRAVENOUS
  Administered 2019-05-26: 50 ug via INTRAVENOUS

## 2019-05-26 MED ORDER — HEMOSTATIC AGENTS (NO CHARGE) OPTIME
TOPICAL | Status: DC | PRN
  Administered 2019-05-26: 1 via TOPICAL

## 2019-05-26 MED ORDER — LIDOCAINE-EPINEPHRINE 1 %-1:100000 IJ SOLN
INTRAMUSCULAR | Status: DC | PRN
  Administered 2019-05-26: 10 mL

## 2019-05-26 MED ORDER — ENOXAPARIN SODIUM 40 MG/0.4ML ~~LOC~~ SOLN
40.0000 mg | SUBCUTANEOUS | Status: DC
  Administered 2019-05-27 – 2019-05-29 (×3): 40 mg via SUBCUTANEOUS
  Filled 2019-05-26 (×3): qty 0.4

## 2019-05-26 MED ORDER — PANTOPRAZOLE SODIUM 40 MG PO PACK
40.0000 mg | PACK | Freq: Every day | ORAL | Status: DC
  Filled 2019-05-26: qty 20

## 2019-05-26 MED ORDER — CHLORHEXIDINE GLUCONATE CLOTH 2 % EX PADS: 6.0000 | MEDICATED_PAD | Freq: Once | CUTANEOUS | Status: DC

## 2019-05-26 MED ORDER — EPHEDRINE SULFATE 50 MG/ML IJ SOLN
INTRAMUSCULAR | Status: DC | PRN
  Administered 2019-05-26 (×2): 10 mg via INTRAVENOUS

## 2019-05-26 MED ORDER — ONDANSETRON HCL 4 MG PO TABS: 4.0000 mg | ORAL_TABLET | Freq: Four times a day (QID) | ORAL | Status: DC | PRN

## 2019-05-26 MED ORDER — MIDAZOLAM HCL 5 MG/5ML IJ SOLN
INTRAMUSCULAR | Status: DC | PRN
  Administered 2019-05-26: 2 mg via INTRAVENOUS

## 2019-05-26 MED ORDER — HYDROMORPHONE HCL 1 MG/ML IJ SOLN
0.2500 mg | INTRAMUSCULAR | Status: DC | PRN
  Administered 2019-05-26 (×2): 0.5 mg via INTRAVENOUS

## 2019-05-26 MED ORDER — HYDROMORPHONE HCL 1 MG/ML IJ SOLN
0.5000 mg | INTRAMUSCULAR | Status: DC | PRN
  Administered 2019-05-26 – 2019-05-27 (×4): 0.5 mg via INTRAVENOUS
  Filled 2019-05-26 (×4): qty 1

## 2019-05-26 MED ORDER — METHOCARBAMOL 500 MG PO TABS
500.0000 mg | ORAL_TABLET | Freq: Three times a day (TID) | ORAL | Status: DC | PRN
  Administered 2019-05-27 – 2019-05-29 (×3): 500 mg via ORAL
  Filled 2019-05-26 (×3): qty 1

## 2019-05-26 MED ORDER — LIDOCAINE-EPINEPHRINE 1 %-1:100000 IJ SOLN
INTRAMUSCULAR | Status: AC
  Filled 2019-05-26: qty 1

## 2019-05-26 MED ORDER — LIDOCAINE 2% (20 MG/ML) 5 ML SYRINGE
INTRAMUSCULAR | Status: DC | PRN
  Administered 2019-05-26: 60 mg via INTRAVENOUS

## 2019-05-26 MED ORDER — SUGAMMADEX SODIUM 200 MG/2ML IV SOLN
INTRAVENOUS | Status: DC | PRN
  Administered 2019-05-26: 200 mg via INTRAVENOUS

## 2019-05-26 MED ORDER — DIAZEPAM 5 MG PO TABS
5.0000 mg | ORAL_TABLET | Freq: Four times a day (QID) | ORAL | Status: DC | PRN
  Filled 2019-05-26: qty 1

## 2019-05-26 MED ORDER — CEFAZOLIN SODIUM-DEXTROSE 2-4 GM/100ML-% IV SOLN
2.0000 g | Freq: Three times a day (TID) | INTRAVENOUS | Status: AC
  Administered 2019-05-26 – 2019-05-29 (×9): 2 g via INTRAVENOUS
  Filled 2019-05-26 (×9): qty 100

## 2019-05-26 MED ORDER — GABAPENTIN 300 MG PO CAPS
300.0000 mg | ORAL_CAPSULE | Freq: Two times a day (BID) | ORAL | Status: DC
  Administered 2019-05-26 – 2019-05-29 (×6): 300 mg via ORAL
  Filled 2019-05-26 (×6): qty 1

## 2019-05-26 MED ORDER — FLEET ENEMA 7-19 GM/118ML RE ENEM: 1.0000 | ENEMA | Freq: Once | RECTAL | Status: DC | PRN

## 2019-05-26 MED ORDER — HYDROMORPHONE HCL 1 MG/ML IJ SOLN
INTRAMUSCULAR | Status: AC
  Filled 2019-05-26: qty 1

## 2019-05-26 MED ORDER — FENTANYL CITRATE (PF) 100 MCG/2ML IJ SOLN
INTRAMUSCULAR | Status: AC
  Filled 2019-05-26: qty 2

## 2019-05-26 MED ORDER — DEXAMETHASONE SODIUM PHOSPHATE 10 MG/ML IJ SOLN
INTRAMUSCULAR | Status: DC | PRN
  Administered 2019-05-26: 10 mg via INTRAVENOUS

## 2019-05-26 MED ORDER — PIOGLITAZONE HCL 15 MG PO TABS
15.0000 mg | ORAL_TABLET | Freq: Every day | ORAL | Status: DC
  Administered 2019-05-26 – 2019-05-29 (×4): 15 mg via ORAL
  Filled 2019-05-26 (×4): qty 1

## 2019-05-26 MED ORDER — INSULIN LISPRO (1 UNIT DIAL) 100 UNIT/ML (KWIKPEN): 10.0000 [IU] | PEN_INJECTOR | Freq: Three times a day (TID) | SUBCUTANEOUS | Status: DC

## 2019-05-26 MED ORDER — 0.9 % SODIUM CHLORIDE (POUR BTL) OPTIME
TOPICAL | Status: DC | PRN
  Administered 2019-05-26: 1000 mL

## 2019-05-26 MED ORDER — NICOTINE 7 MG/24HR TD PT24
7.0000 mg | MEDICATED_PATCH | Freq: Every day | TRANSDERMAL | Status: DC
  Administered 2019-05-26 – 2019-05-29 (×4): 7 mg via TRANSDERMAL
  Filled 2019-05-26 (×4): qty 1

## 2019-05-26 MED ORDER — POTASSIUM CHLORIDE IN NACL 20-0.9 MEQ/L-% IV SOLN
INTRAVENOUS | Status: DC
  Filled 2019-05-26 (×2): qty 1000

## 2019-05-26 MED ORDER — FENTANYL CITRATE (PF) 100 MCG/2ML IJ SOLN
25.0000 ug | INTRAMUSCULAR | Status: DC | PRN
  Administered 2019-05-26 (×3): 50 ug via INTRAVENOUS

## 2019-05-26 MED ORDER — PHENOL 1.4 % MT LIQD: 1.0000 | OROMUCOSAL | Status: DC | PRN

## 2019-05-26 MED ORDER — PROPRANOLOL HCL 10 MG PO TABS
10.0000 mg | ORAL_TABLET | Freq: Three times a day (TID) | ORAL | Status: DC
  Administered 2019-05-26 – 2019-05-29 (×9): 10 mg via ORAL
  Filled 2019-05-26 (×11): qty 1

## 2019-05-26 MED ORDER — SODIUM CHLORIDE 0.9 % IV SOLN: INTRAVENOUS | Status: DC | PRN

## 2019-05-26 MED ORDER — BACITRACIN ZINC 500 UNIT/GM EX OINT
TOPICAL_OINTMENT | CUTANEOUS | Status: DC | PRN
  Administered 2019-05-26: 1 via TOPICAL

## 2019-05-26 MED ORDER — PROPRANOLOL HCL 10 MG PO TABS
10.0000 mg | ORAL_TABLET | Freq: Once | ORAL | Status: AC
  Administered 2019-05-26: 10 mg via ORAL
  Filled 2019-05-26: qty 1

## 2019-05-26 MED ORDER — CEFAZOLIN SODIUM-DEXTROSE 2-4 GM/100ML-% IV SOLN
INTRAVENOUS | Status: AC
  Filled 2019-05-26: qty 100

## 2019-05-26 MED ORDER — METFORMIN HCL ER 500 MG PO TB24
1000.0000 mg | ORAL_TABLET | Freq: Two times a day (BID) | ORAL | Status: DC
  Administered 2019-05-26 – 2019-05-29 (×6): 1000 mg via ORAL
  Filled 2019-05-26 (×7): qty 2

## 2019-05-26 MED ORDER — DIAZEPAM 5 MG PO TABS
5.0000 mg | ORAL_TABLET | Freq: Two times a day (BID) | ORAL | Status: DC | PRN
  Administered 2019-05-26 – 2019-05-27 (×2): 5 mg via ORAL
  Filled 2019-05-26: qty 1

## 2019-05-26 MED ORDER — POLYETHYLENE GLYCOL 3350 17 G PO PACK
17.0000 g | PACK | Freq: Every day | ORAL | Status: DC
  Administered 2019-05-27 – 2019-05-29 (×3): 17 g via ORAL
  Filled 2019-05-26 (×3): qty 1

## 2019-05-26 MED ORDER — LACTATED RINGERS IV SOLN: INTRAVENOUS | Status: DC | PRN

## 2019-05-26 MED ORDER — DOCUSATE SODIUM 100 MG PO CAPS
100.0000 mg | ORAL_CAPSULE | Freq: Two times a day (BID) | ORAL | Status: DC
  Administered 2019-05-26 – 2019-05-29 (×6): 100 mg via ORAL
  Filled 2019-05-26 (×6): qty 1

## 2019-05-26 MED ORDER — INSULIN ASPART 100 UNIT/ML ~~LOC~~ SOLN: 0.0000 [IU] | Freq: Every day | SUBCUTANEOUS | Status: DC

## 2019-05-26 MED ORDER — MENTHOL 3 MG MT LOZG: 1.0000 | LOZENGE | OROMUCOSAL | Status: DC | PRN

## 2019-05-26 MED ORDER — OXYCODONE-ACETAMINOPHEN 5-325 MG PO TABS
1.0000 | ORAL_TABLET | ORAL | Status: DC | PRN
  Administered 2019-05-26: 1 via ORAL
  Administered 2019-05-26 – 2019-05-27 (×3): 2 via ORAL
  Filled 2019-05-26 (×3): qty 2
  Filled 2019-05-26: qty 1
  Filled 2019-05-26: qty 2

## 2019-05-26 MED ORDER — ACETAMINOPHEN 325 MG PO TABS
650.0000 mg | ORAL_TABLET | ORAL | Status: DC | PRN
  Administered 2019-05-29: 650 mg via ORAL
  Filled 2019-05-26: qty 2

## 2019-05-26 MED ORDER — ONDANSETRON HCL 4 MG/2ML IJ SOLN: 4.0000 mg | Freq: Four times a day (QID) | INTRAMUSCULAR | Status: DC | PRN

## 2019-05-26 MED ORDER — ONDANSETRON HCL 4 MG/2ML IJ SOLN
INTRAMUSCULAR | Status: DC | PRN
  Administered 2019-05-26: 4 mg via INTRAVENOUS

## 2019-05-26 MED ORDER — FENTANYL CITRATE (PF) 250 MCG/5ML IJ SOLN
INTRAMUSCULAR | Status: AC
  Filled 2019-05-26: qty 5

## 2019-05-26 MED ORDER — ROCURONIUM BROMIDE 50 MG/5ML IV SOSY
PREFILLED_SYRINGE | INTRAVENOUS | Status: DC | PRN
  Administered 2019-05-26 (×3): 20 mg via INTRAVENOUS
  Administered 2019-05-26: 50 mg via INTRAVENOUS
  Administered 2019-05-26: 20 mg via INTRAVENOUS
  Administered 2019-05-26: 10 mg via INTRAVENOUS
  Administered 2019-05-26: 30 mg via INTRAVENOUS
  Administered 2019-05-26 (×2): 10 mg via INTRAVENOUS
  Administered 2019-05-26: 20 mg via INTRAVENOUS

## 2019-05-26 MED ORDER — PROPOFOL 10 MG/ML IV BOLUS
INTRAVENOUS | Status: AC
  Filled 2019-05-26: qty 40

## 2019-05-26 MED ORDER — THROMBIN 5000 UNITS EX SOLR
OROMUCOSAL | Status: DC | PRN
  Administered 2019-05-26: 5 mL via TOPICAL

## 2019-05-26 MED ORDER — BACITRACIN ZINC 500 UNIT/GM EX OINT
TOPICAL_OINTMENT | CUTANEOUS | Status: AC
  Filled 2019-05-26: qty 28.35

## 2019-05-26 MED ORDER — MIDAZOLAM HCL 2 MG/2ML IJ SOLN
INTRAMUSCULAR | Status: AC
  Filled 2019-05-26: qty 2

## 2019-05-26 MED ORDER — THROMBIN 5000 UNITS EX SOLR
CUTANEOUS | Status: AC
  Filled 2019-05-26: qty 5000

## 2019-05-26 MED ORDER — OXYCODONE HCL 5 MG PO TABS: 5.0000 mg | ORAL_TABLET | Freq: Once | ORAL | Status: DC | PRN

## 2019-05-26 MED ORDER — ROCURONIUM BROMIDE 10 MG/ML (PF) SYRINGE
PREFILLED_SYRINGE | INTRAVENOUS | Status: AC
  Filled 2019-05-26: qty 10

## 2019-05-26 MED ORDER — SODIUM CHLORIDE 0.9% FLUSH: 3.0000 mL | INTRAVENOUS | Status: DC | PRN

## 2019-05-26 MED ORDER — SODIUM CHLORIDE 0.9% FLUSH
3.0000 mL | Freq: Two times a day (BID) | INTRAVENOUS | Status: DC
  Administered 2019-05-26 – 2019-05-28 (×5): 3 mL via INTRAVENOUS

## 2019-05-26 MED ORDER — ALBUTEROL SULFATE HFA 108 (90 BASE) MCG/ACT IN AERS
INHALATION_SPRAY | RESPIRATORY_TRACT | Status: DC | PRN
  Administered 2019-05-26: 4 via RESPIRATORY_TRACT

## 2019-05-26 MED ORDER — PROPOFOL 10 MG/ML IV BOLUS
INTRAVENOUS | Status: DC | PRN
  Administered 2019-05-26: 160 mg via INTRAVENOUS
  Administered 2019-05-26 (×2): 40 mg via INTRAVENOUS

## 2019-05-26 MED ORDER — PHENYLEPHRINE HCL (PRESSORS) 10 MG/ML IV SOLN
INTRAVENOUS | Status: DC | PRN
  Administered 2019-05-26: 40 ug via INTRAVENOUS

## 2019-05-26 MED ORDER — ACETAMINOPHEN 650 MG RE SUPP: 650.0000 mg | RECTAL | Status: DC | PRN

## 2019-05-26 MED ORDER — DEXTROSE 5 % IV SOLN
3.0000 g | INTRAVENOUS | Status: AC
  Administered 2019-05-26: 3 g via INTRAVENOUS
  Filled 2019-05-26: qty 3000

## 2019-05-26 MED ORDER — PHENYLEPHRINE HCL-NACL 10-0.9 MG/250ML-% IV SOLN
INTRAVENOUS | Status: DC | PRN
  Administered 2019-05-26: 25 ug/min via INTRAVENOUS

## 2019-05-26 MED ORDER — THROMBIN 5000 UNITS EX SOLR
CUTANEOUS | Status: DC | PRN
  Administered 2019-05-26 (×2): 5000 [IU] via TOPICAL

## 2019-05-26 MED ORDER — SODIUM CHLORIDE 0.9 % IV SOLN
250.0000 mL | INTRAVENOUS | Status: DC
  Administered 2019-05-26: 250 mL via INTRAVENOUS

## 2019-05-26 MED ORDER — INSULIN GLARGINE 100 UNIT/ML ~~LOC~~ SOLN
54.0000 [IU] | Freq: Two times a day (BID) | SUBCUTANEOUS | Status: DC
  Administered 2019-05-26 – 2019-05-27 (×3): 54 [IU] via SUBCUTANEOUS
  Filled 2019-05-26 (×6): qty 0.54

## 2019-05-26 MED ORDER — SUCCINYLCHOLINE CHLORIDE 200 MG/10ML IV SOSY
PREFILLED_SYRINGE | INTRAVENOUS | Status: DC | PRN
  Administered 2019-05-26: 120 mg via INTRAVENOUS

## 2019-05-26 MED ORDER — OXYCODONE HCL 5 MG/5ML PO SOLN: 5.0000 mg | Freq: Once | ORAL | Status: DC | PRN

## 2019-05-26 SURGICAL SUPPLY — 56 items
BAG DECANTER FOR FLEXI CONT (MISCELLANEOUS) ×3 IMPLANT
BENZOIN TINCTURE PRP APPL 2/3 (GAUZE/BANDAGES/DRESSINGS) ×2 IMPLANT
BIT DRILL 2.4 (BIT) ×1 IMPLANT
BIT DRILL 2.4MM (BIT) ×1
BLADE ULTRA TIP 2M (BLADE) ×2 IMPLANT
BUR MATCHSTICK NEURO 3.0 LAGG (BURR) ×2 IMPLANT
BUR PRECISION FLUTE 5.0 (BURR) ×3 IMPLANT
CANISTER SUCT 3000ML PPV (MISCELLANEOUS) ×3 IMPLANT
CARTRIDGE OIL MAESTRO DRILL (MISCELLANEOUS) ×1 IMPLANT
DIFFUSER DRILL AIR PNEUMATIC (MISCELLANEOUS) ×3 IMPLANT
DRAPE C-ARM 42X72 X-RAY (DRAPES) ×6 IMPLANT
DRAPE C-ARMOR (DRAPES) ×2 IMPLANT
DRAPE LAPAROTOMY 100X72 PEDS (DRAPES) ×3 IMPLANT
DRSG OPSITE 4X5.5 SM (GAUZE/BANDAGES/DRESSINGS) ×2 IMPLANT
DRSG OPSITE POSTOP 4X6 (GAUZE/BANDAGES/DRESSINGS) ×2 IMPLANT
DURAPREP 6ML APPLICATOR 50/CS (WOUND CARE) ×3 IMPLANT
ELECT COATED BLADE 2.86 ST (ELECTRODE) ×2 IMPLANT
ELECT REM PT RETURN 9FT ADLT (ELECTROSURGICAL) ×3
ELECTRODE REM PT RTRN 9FT ADLT (ELECTROSURGICAL) ×1 IMPLANT
GLOVE BIO SURGEON STRL SZ7 (GLOVE) ×4 IMPLANT
GLOVE BIO SURGEON STRL SZ7.5 (GLOVE) ×10 IMPLANT
GLOVE BIOGEL PI IND STRL 7.0 (GLOVE) IMPLANT
GLOVE BIOGEL PI IND STRL 7.5 (GLOVE) ×2 IMPLANT
GLOVE BIOGEL PI INDICATOR 7.0 (GLOVE) ×6
GLOVE BIOGEL PI INDICATOR 7.5 (GLOVE) ×10
GLOVE ECLIPSE 7.0 STRL STRAW (GLOVE) ×3 IMPLANT
GOWN STRL REUS W/ TWL LRG LVL3 (GOWN DISPOSABLE) IMPLANT
GOWN STRL REUS W/ TWL XL LVL3 (GOWN DISPOSABLE) ×1 IMPLANT
GOWN STRL REUS W/TWL LRG LVL3 (GOWN DISPOSABLE) ×6
GOWN STRL REUS W/TWL XL LVL3 (GOWN DISPOSABLE) ×6
HEMOSTAT POWDER KIT SURGIFOAM (HEMOSTASIS) ×2 IMPLANT
KIT BASIN OR (CUSTOM PROCEDURE TRAY) ×3 IMPLANT
KIT TURNOVER KIT B (KITS) ×3 IMPLANT
NEEDLE HYPO 22GX1.5 SAFETY (NEEDLE) ×3 IMPLANT
NS IRRIG 1000ML POUR BTL (IV SOLUTION) ×3 IMPLANT
OIL CARTRIDGE MAESTRO DRILL (MISCELLANEOUS) ×3
PACK LAMINECTOMY NEURO (CUSTOM PROCEDURE TRAY) ×3 IMPLANT
PIN MAYFIELD SKULL DISP (PIN) ×3 IMPLANT
PUTTY DBM GRAFTON 5CC (Putty) ×2 IMPLANT
ROD PRE-CUT 3.5X90 (Rod) ×4 IMPLANT
SCREW MA INFINITY 3.5X12 (Screw) ×2 IMPLANT
SCREW MULTI AX 25X5.5 TRNS (Screw) IMPLANT
SCREW MULTI AXIAL 3.5X14MM (Screw) ×14 IMPLANT
SCREW MULTI AXIAL 5.5X25 (Screw) ×6 IMPLANT
SCREW SET 3600315 STANDARD (Screw) ×30 IMPLANT
SCREW SET 3600315 STD (Screw) IMPLANT
SPONGE SURGIFOAM ABS GEL SZ50 (HEMOSTASIS) ×3 IMPLANT
STAPLER VISISTAT 35W (STAPLE) ×3 IMPLANT
SUT VIC AB 0 CT1 18XCR BRD8 (SUTURE) ×1 IMPLANT
SUT VIC AB 0 CT1 8-18 (SUTURE) ×3
SUT VIC AB 2-0 CP2 18 (SUTURE) ×2 IMPLANT
SUT VIC AB 2-0 CT1 18 (SUTURE) IMPLANT
SUT VICRYL 3-0 RB1 18 ABS (SUTURE) ×2 IMPLANT
TOWEL GREEN STERILE (TOWEL DISPOSABLE) ×3 IMPLANT
TOWEL GREEN STERILE FF (TOWEL DISPOSABLE) ×3 IMPLANT
WATER STERILE IRR 1000ML POUR (IV SOLUTION) ×3 IMPLANT

## 2019-05-26 NOTE — Progress Notes (Signed)
Patient feeling claustrophobic and is requesting something for anxiety. He is also requesting some Nicoderm patches as he smokes 1 pack/day at home. Office for [facility] called with no answer.

## 2019-05-26 NOTE — Transfer of Care (Signed)
Immediate Anesthesia Transfer of Care Note  Patient: [name]  Procedure(s) Performed: POSTERIOR CERVICAL FUSION WITH LATERAL MASS FIXATION CERVICAL THREE- THORACIC ONE (N/A Spine Cervical)  Patient Location: PACU  Anesthesia Type:General  Level of Consciousness: awake, alert , patient cooperative and responds to stimulation  Airway & Oxygen Therapy: Patient Spontanous Breathing and Patient connected to face mask oxygen  Post-op Assessment: Report given to RN, Post -op Vital signs reviewed and stable and Patient moving all extremities X 4  Post vital signs: Reviewed and stable  Last Vitals:  Vitals Value Taken Time  BP 174/103 05/26/19 1211  Temp    Pulse 108 05/26/19 1212  Resp 26 05/26/19 1212  SpO2 97 % 05/26/19 1212  Vitals shown include unvalidated device data.  Last Pain:  Vitals:   05/26/19 0634  TempSrc: Oral  PainSc:          Complications: No apparent anesthesia complications

## 2019-05-26 NOTE — Op Note (Signed)
PREOP DIAGNOSIS: cervical stenosis with myelopathy  POSTOP DIAGNOSIS: cervical stenosis with myelopathy   PROCEDURE: 1. Posterior arthrodesis C3-4 2.  Posterior arthrodesis, additional levels C4-5, C5-6, C6-7, C7-T1 3. Laminectomy and foraminotomies, C3-4 4. Laminectomy and foraminotomies, additional levels C4-5, C5-6, C6-7 5. Segmental instrumentation with lateral mass/pedicle screw and rod construct, C3-T1 6. Harvest of local autograft 7. Use of morselized bone allograft    SURGEON: Dr. Duffy Rhody, MD  ASSISTANT: Sherley Bounds, MD.  Please note, no qualified trainees were available to assist with the procedure.  Assistance was required for retraction of the visceral structures to safely allow for instrumentation.  ANESTHESIA: General Endotracheal  EBL: 250 cc  IMPLANTS: Medtronic 3.5 x 14 mm lateral mass screws bilaterally at C3, C4, C5 and on the left at C6 right at 6, 3.5 mm x 12 mm.  5.5 x 25 mm screws placed at T1 bilaterally.  Bilateral 90 mm titanium and Medtronic Infinity screw caps. DBM  SPECIMENS: None  DRAINS: Subfascial drain  COMPLICATIONS: None immediate  CONDITION: Hemodynamically stable to PACU  HISTORY: [name] is a 44 y.o. y.o. male presented to the clinic with progressive myelopathy with hand weakness and difficulty walking.  He also complained of significant neck pain.  He was found to have significant myelopathy.  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  A long discussion with the patient regarding treatment options and given his significant pain as well as cord impingement with cord signal change, I recommended surgical decompression and fusion.  Risks, benefits, alternatives, and expected convalescence were discussed with the patient.  Risks discussed included but were not limited to bleeding, pain, infection, pseudoarthrosis, hardware failure, adjacent segment disease, CSF  leak, neurologic deficits, weakness, numbness, paralysis, coma, and death. After all questions were answered, informed consent was obtained.  PROCEDURE IN DETAIL: The patient was brought to the operating room . After induction of general anesthesia, patient's head was placed in a [place] head holder and the patient was positioned prone on the operative table with all pressure points meticulously padded and secured in neutral position. The skin of the posterior neck was then prepped and draped in the usual sterile fashion.  X-ray was used to localize incision over the appropriate after timeout was conducted, the skin was infiltrated with local anesthetic. Skin incision was then made sharply and Bovie electrocautery was used to dissect the subcutaneous tissue and sharply opened the fascia.  Paraspinous muscles were dissected off from C3-T1 lamina in subperiosteal fashion.  Self-retaining retractor was used.  X-ray was used to confirm localization.  En bloc laminectomies were then performed by drilling troughs at the junction of the lateral mass and lamina bilaterally and removing intervening ligamentum flavum P.  Most inferior most levels.  This bone was harvested for autograft.  Additional foraminotomies were performed at right C5-6 and C6-7.  Decompression was confirmed with easy passage.  Meticulous epidural hemostasis obtained.  Lateral mass screws were then placed bilaterally at C3, C4, C5, and C6 using the Magerl technique.  Pilot holes were drilled followed by our relative probe confirming good cannulation of.  The holes were then tapped and lateral mass screws were placed with good purchase.  At T1, using the C-arm x-ray, pilot hole was drilled and a pedicle probe was used to cannulate the pedicle.  A ball ended probe was used to sound the pedicle and determine the appropriate length of pedicle screw and confirmed good  cannulation.  The screw holes were tapped and then pedicle screws were placed.  The  lateral masses and transverse process were then decorticated including the joint spaces.  Rod was then placed in the tulip head and secured with screw caps and final tightened.  Final x-ray confirmed appropriate instrumentation placement.  The wound was irrigated thoroughly with antibiotic impregnated irrigation.  Autograft mixed with allograft was placed in the lateral gutters bilaterally.  A 7 flat JP drain was placed in the subfascial space and tunneled out the skin and secured with a stitch.  The muscle layer was closed with 0 Vicryl stitches.  The fascia was closed with 0 Vicryl stitches.  The subcutaneous layer was closed with 0 Vicryl stitches.  The dermal layer was closed with 2-0 Vicryl stitches in buried fashion.  Skin was closed with staples.  A sterile dressing was then placed patient was then removed from [place] head holder and flipped supine and extubated by the anesthesia service.  All counts were correct at the end of surgery.  No complications were noted.

## 2019-05-26 NOTE — Anesthesia Procedure Notes (Signed)
Procedure Name: Intubation Date/Time: 05/26/2019 7:55 AM Performed by: Margarita Rana, CRNA Pre-anesthesia Checklist: Patient identified, Patient being monitored, Timeout performed, Emergency Drugs available and Suction available Patient Re-evaluated:Patient Re-evaluated prior to induction Oxygen Delivery Method: Circle System Utilized Preoxygenation: Pre-oxygenation with 100% oxygen Induction Type: IV induction Ventilation: Mask ventilation without difficulty Laryngoscope Size: Glidescope and 4 Grade View: Grade I Tube type: Oral Tube size: 7.5 mm Number of attempts: 1 Airway Equipment and Method: Video-laryngoscopy Placement Confirmation: ETT inserted through vocal cords under direct vision,  positive ETCO2 and breath sounds checked- equal and bilateral Secured at: 21 cm Tube secured with: Tape Dental Injury: Teeth and Oropharynx as per pre-operative assessment

## 2019-05-26 NOTE — H&P (Signed)
Chief Complaint   Hand weakness  History of Present Illness  [name] is a 44 y.o. male smoker with DM who presented for neck and shoulder pain and difficulty with left arm numbness and poor coordination in his L>R arm and in his legs.  He is a smoker who has cut down from 3 pks/day to 1 pk/day.   Past Medical History   Past Medical History:  Diagnosis Date  . Acute pancreatitis 07/2010  . Anxiety   . Arthritis    "lower back and shoulders" (10/07/2017)  . Chronic lower back pain   . Dyslipidemia   . Headache    "weekly" (10/07/2017)  . Hypercholesteremia   . Hypertension   . MRSA (methicillin resistant staph aureus) culture positive   . Obesity   . Obesity   . OSA on CPAP   . Peripheral neuropathy   . Scoliosis   . Sleep apnea    "can't tolerate any of the masks" (10/07/2017)  . Stroke [facility])    stroke-like 2.5 years ago  . TIA (transient ischemic attack)   . Type II diabetes mellitus ([place])   . Umbilical hernia     Past Surgical History   Past Surgical History:  Procedure Laterality Date  . HIP PINNING Bilateral    "they are worn out" (10/07/2017)  . PICC LINE INSERTION  07/2010   "in hospital for ~ 2 months; took PICC out when I was discharged"  . 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 Smoking status: Current Every Day Smoker    Packs/day: 0.50    Years: 20.00    Pack years: 10.00    Types: Cigarettes  . Smokeless tobacco: Former Systems developer    Types: Snuff  . Tobacco comment: 10/07/2017 "quit years ago"  Substance Use Topics  . Alcohol use: Not Currently  . Drug use: No    Medications   Prior to Admission medications   Medication Sig Start Date End Date Taking? Authorizing Provider  amLODipine (NORVASC) 10 MG tablet Take 1 tablet (10 mg total) by mouth daily. 10/07/17  Yes Alphonzo Grieve, MD  cyclobenzaprine (FLEXERIL) 10 MG tablet Take 10 mg by mouth  at bedtime as needed for muscle spasms. 05/03/19  Yes [provider]  gabapentin (NEURONTIN) 300 MG capsule Take 300 mg by mouth 2 (two) times daily.   Yes [provider]  HUMALOG KWIKPEN 100 UNIT/ML KwikPen Inject 10-15 Units into the skin 3 (three) times daily before meals.  06/12/18  Yes [provider]  Insulin Glargine (TOUJEO SOLOSTAR [place]) Inject 54 Units into the skin 2 (two) times daily.    Yes [provider]  metFORMIN (GLUCOPHAGE-XR) 500 MG 24 hr tablet Take 2 tablets (1,000 mg total) by mouth 2 (two) times daily with a meal. 10/07/17  Yes Alphonzo Grieve, MD  methocarbamol (ROBAXIN) 500 MG tablet Take 500 mg by mouth daily as needed for muscle spasms. 04/02/19  Yes [provider]  pioglitazone (ACTOS) 15 MG tablet Take 15 mg by mouth daily. 06/06/17  Yes [provider]  propranolol (INDERAL) 10 MG tablet Take 10 mg by mouth 3 (three) times daily.   Yes [provider]  aspirin EC 81 MG EC tablet Take 1 tablet (81 mg total) by mouth daily. Patient not taking: Reported on 05/17/2019 10/08/17   Alphonzo Grieve, MD  Allergies   Allergies  Allergen Reactions  . Losartan Swelling and Rash    Patient says 'he's not 100% sure if it was losartan, but he thinks it is." He knows it was a blood pressure medicine.    Review of Systems  ROS  Neurologic Exam  Obese Awake, alert, oriented Memory and concentration grossly intact Speech fluent, appropriate CN grossly intact Motor exam: RUE 5/5 except 4/5 hand grip, LUE 5/5 D, 4/5 T, WExt, and hand grip. LEs 5/5 Sensation grossly intact to LT  Imaging  Severe cervical stenosis with OPLL  Impression  - 44 y.o. male with severe cervical stenosis with OPLL  Plan  - C3-T1 PCDF today

## 2019-05-27 ENCOUNTER — Encounter: Payer: Self-pay | Admitting: *Deleted

## 2019-05-27 ENCOUNTER — Inpatient Hospital Stay (HOSPITAL_COMMUNITY): Payer: BC Managed Care – PPO

## 2019-05-27 LAB — GLUCOSE, CAPILLARY
Glucose-Capillary: 106 mg/dL — ABNORMAL HIGH (ref 70–99)
Glucose-Capillary: 82 mg/dL (ref 70–99)
Glucose-Capillary: 52 mg/dL — ABNORMAL LOW (ref 70–99)
Glucose-Capillary: 63 mg/dL — ABNORMAL LOW (ref 70–99)
Glucose-Capillary: 100 mg/dL — ABNORMAL HIGH (ref 70–99)
Glucose-Capillary: 126 mg/dL — ABNORMAL HIGH (ref 70–99)

## 2019-05-27 LAB — MRSA PCR SCREENING: MRSA by PCR: NEGATIVE

## 2019-05-27 MED ORDER — PANTOPRAZOLE SODIUM 40 MG PO TBEC
40.0000 mg | DELAYED_RELEASE_TABLET | Freq: Every day | ORAL | Status: DC
  Administered 2019-05-27 – 2019-05-29 (×3): 40 mg via ORAL
  Filled 2019-05-27 (×3): qty 1

## 2019-05-27 MED ORDER — OXYCODONE-ACETAMINOPHEN 5-325 MG PO TABS
2.0000 | ORAL_TABLET | ORAL | Status: DC | PRN
  Administered 2019-05-27 – 2019-05-29 (×9): 2 via ORAL
  Filled 2019-05-27 (×9): qty 2

## 2019-05-27 MED ORDER — DIAZEPAM 5 MG PO TABS
5.0000 mg | ORAL_TABLET | Freq: Three times a day (TID) | ORAL | Status: DC | PRN
  Administered 2019-05-28 – 2019-05-29 (×3): 5 mg via ORAL
  Filled 2019-05-27 (×3): qty 1

## 2019-05-27 MED ORDER — HYDROMORPHONE HCL 1 MG/ML IJ SOLN
1.0000 mg | INTRAMUSCULAR | Status: DC | PRN
  Administered 2019-05-27 – 2019-05-28 (×8): 1 mg via INTRAVENOUS
  Filled 2019-05-27 (×9): qty 1

## 2019-05-27 NOTE — Evaluation (Signed)
Physical Therapy Evaluation and Discharge Patient Details Name: [name] MRN: [id number] DOB: May 13, 1975 Today's Date: 05/27/2019   History of Present Illness  44 y.o. male smoker with DM and h/o bil hip pinning presented for neck and shoulder pain and difficulty with left arm numbness and poor coordination in his L>R arm and in his legs. 05/26/19 underwent C3-T1 laminectomies, foraminotomies, and posterior arthodesis.   Clinical Impression   Patient evaluated by Physical Therapy with no further acute PT needs identified. All education has been completed and the patient has no further questions. We discussed that several weeks after surgery, MD may refer him for either OP PT or OT for functional use of LUE if indicated. PT is signing off. Thank you for this referral.     Follow Up Recommendations No PT follow up    Equipment Recommendations  None recommended by PT    Recommendations for Other Services       Precautions / Restrictions Precautions Precautions: Cervical Precaution Booklet Issued: Yes (comment)(given earlier by OT) Precaution Comments: pt could not state precautions and reviewed Required Braces or Orthoses: Cervical Brace Cervical Brace: Hard collar;At all times;Other (comment)(Must wear in bed; off for showers) Restrictions Weight Bearing Restrictions: No      Mobility  Bed Mobility Overal bed mobility: Needs Assistance Bed Mobility: Supine to Sit;Sit to Sidelying Rolling: Min assist   Supine to sit: Min assist;HOB elevated   Sit to sidelying: Min assist General bed mobility comments: Pt reports the log roll technique will not work well for him because of his extra weight. He reports he plans to sleep in a recliner on return home. Allowed HOB fully elevated for pivot to sit EOB. HOB partially lowered for return to bed  Transfers Overall transfer level: Needs assistance Equipment used: None Transfers: Sit to/from Stand Sit to Stand: Supervision         General transfer comment: supervision for safety only; denied dizziness, no unsteadiness  Ambulation/Gait Ambulation/Gait assistance: Supervision Gait Distance (Feet): 130 Feet Assistive device: None Gait Pattern/deviations: Step-through pattern;Decreased stride length;Wide base of support     General Gait Details: slow gait due to incr pain at neck; uses wide BOS due to body habitus  Stairs         General stair comments: Educated on using cane on steps and how to flex at hips to see the first step; must have someone with him for safety initially due to collar   Wheelchair Mobility    Modified Rankin (Stroke Patients Only)       Balance Overall balance assessment: Needs assistance Sitting-balance support: No upper extremity supported;Feet supported Sitting balance-Leahy Scale: Fair     Standing balance support: No upper extremity supported;During functional activity Standing balance-Leahy Scale: Fair                               Pertinent Vitals/Pain Pain Assessment: 0-10 Pain Score: 10-Worst pain ever(pt without signs associated with level 10 pain) Pain Location: Neck Pain Descriptors / Indicators: Constant;Discomfort;Grimacing Pain Intervention(s): Limited activity within patient's tolerance;Monitored during session;Premedicated before session;Repositioned    Home Living Family/patient expects to be discharged to:: Private residence Living Arrangements: Children;Parent(Mother; son Mon-Wed) Available Help at Discharge: Family;Available PRN/intermittently Type of Home: House Home Access: Stairs to enter Entrance Stairs-Rails: None Entrance Stairs-Number of Steps: 3-4 Home Layout: Two level;Full bath on main level(His bedroom on main floor) Home Equipment: Cane - single point  Prior Function Level of Independence: Independent         Comments: Works full time "builds bridges"; between injections for his hips he sometimes uses his cane  due to hip pain     Hand Dominance   Dominant Hand: Right    Extremity/Trunk Assessment   Upper Extremity Assessment Upper Extremity Assessment: Defer to OT evaluation RUE Deficits / Details: WFL strength. Limited ROM at shoulders. RUE Coordination: decreased gross motor LUE Deficits / Details: WFL strength. Limited ROM at shoulders. Continues to reports slight shoulder pain. Denies any numbness or tingling. Finger opposition LUE Coordination: decreased gross motor    Lower Extremity Assessment Lower Extremity Assessment: Overall WFL for tasks assessed    Cervical / Trunk Assessment Cervical / Trunk Assessment: Other exceptions Cervical / Trunk Exceptions: S/p Cervical sx  Communication   Communication: No difficulties  Cognition Arousal/Alertness: Awake/alert Behavior During Therapy: WFL for tasks assessed/performed;Flat affect Overall Cognitive Status: Within Functional Limits for tasks assessed                                 General Comments: Despite significant pain, pt agreeable to therapy.       General Comments General comments (skin integrity, edema, etc.): Patient groggy from pre-medication for pain. Voice was very low and at times difficult to understand (slightly garbled). When asked to repeat phrases, he was louder and understandable    Exercises     Assessment/Plan    PT Assessment Patent does not need any further PT services  PT Problem List         PT Treatment Interventions      PT Goals (Current goals can be found in the Care Plan section)  Acute Rehab PT Goals Patient Stated Goal: Stop this pain PT Goal Formulation: All assessment and education complete, DC therapy    Frequency     Barriers to discharge        Co-evaluation               AM-PAC PT "6 Clicks" Mobility  Outcome Measure Help needed turning from your back to your side while in a flat bed without using bedrails?: A Lot Help needed moving from lying on  your back to sitting on the side of a flat bed without using bedrails?: A Lot Help needed moving to and from a bed to a chair (including a wheelchair)?: A Little Help needed standing up from a chair using your arms (e.g., wheelchair or bedside chair)?: A Little Help needed to walk in hospital room?: A Little Help needed climbing 3-5 steps with a railing? : A Little 6 Click Score: 16    End of Session Equipment Utilized During Treatment: Cervical collar Activity Tolerance: Patient tolerated treatment well Patient left: in bed;with call bell/phone within reach;with nursing/sitter in room;with family/visitor present;with SCD's reapplied Nurse Communication: Mobility status PT Visit Diagnosis: Other symptoms and signs involving the nervous system (R29.898)    Time: 1102-1130 PT Time Calculation (min) (ACUTE ONLY): 28 min   Charges:   PT Evaluation $PT Eval Low Complexity: 1 Low PT Treatments $Gait Training: 8-22 mins         Jerolyn Center, PT Pager [phone]   Zena Amos 05/27/2019, 12:02 PM

## 2019-05-27 NOTE — Progress Notes (Signed)
Subjective: Patient reports neck pain/soreness.  Still has some numbness in left shoulder  Objective: Vital signs in last 24 hours: Temp:  [97 F (36.1 C)-97.9 F (36.6 C)] 97.8 F (36.6 C) (03/25 0733) Pulse Rate:  [83-113] 83 (03/25 0733) Resp:  [8-26] 18 (03/25 0733) BP: (130-176)/(80-111) 167/102 (03/25 0733) SpO2:  [94 %-99 %] 98 % (03/25 0733)  Intake/Output from previous day: 03/24 0701 - 03/25 0700 In: 1410 [I.V.:1300; IV Piggyback:50] Out: 1830 [Urine:1385; Drains:195; Blood:250] Intake/Output this shift: Total I/O In: -  Out: 820 [Urine:775; Drains:45]  5/5 strength proximal and distal UEs C-collar in place  Lab Results: No results for input(s): WBC, HGB, HCT, PLT in the last 72 hours. BMET No results for input(s): NA, K, CL, CO2, GLUCOSE, BUN, CREATININE, CALCIUM in the last 72 hours.  Studies/Results: DG Cervical Spine 2 or 3 views  Result Date: 05/27/2019 CLINICAL DATA:  Postoperative evaluation. Prior cervical spine fusion. EXAM: CERVICAL SPINE - 2-3 VIEW COMPARISON:  05/26/2019. FINDINGS: Posterior fusion C3 through T1. Hardware intact. Anatomic alignment. Diffuse degenerative change. Wiring noted over the cervicothoracic spine. Surgical staples noted posteriorly. IMPRESSION: Posterior fusion C3 through T1. Hardware intact. Anatomic alignment. Electronically Signed   By: Maisie Fus  Register   On: 05/27/2019 07:57   DG Cervical Spine 2-3 Views  Result Date: 05/26/2019 CLINICAL DATA:  Posterior cervical fusion EXAM: CERVICAL SPINE - 2-3 VIEW; DG C-ARM 1-60 MIN COMPARISON:  05/18/2019 FLUOROSCOPY TIME:  Fluoroscopy Time:  19 seconds Radiation Exposure Index (if provided by the fluoroscopic device): Not available Number of Acquired Spot Images: 3 FINDINGS: Initial images demonstrate surgical instrument at the C6-7 level. Subsequent changes of posterior fusion from C3-T1 are noted. IMPRESSION: Cervical fusion as described. Electronically Signed   By: Alcide Clever [name]    On: 05/26/2019 11:51   DG C-Arm 1-60 Min  Result Date: 05/26/2019 CLINICAL DATA:  Posterior cervical fusion EXAM: CERVICAL SPINE - 2-3 VIEW; DG C-ARM 1-60 MIN COMPARISON:  05/18/2019 FLUOROSCOPY TIME:  Fluoroscopy Time:  19 seconds Radiation Exposure Index (if provided by the fluoroscopic device): Not available Number of Acquired Spot Images: 3 FINDINGS: Initial images demonstrate surgical instrument at the C6-7 level. Subsequent changes of posterior fusion from C3-T1 are noted. IMPRESSION: Cervical fusion as described. Electronically Signed   By: Alcide Clever [name]   On: 05/26/2019 11:51    Assessment/Plan: POD#1 s/p C3-T1 PCDF - have increased dilaudid and percocet - PT/OT, homehealth - IS - lovenox for DVT prophylaxis   [name] 05/27/2019, 10:25 AM

## 2019-05-27 NOTE — Evaluation (Signed)
Occupational Therapy Evaluation Patient Details Name: [name] MRN: [id number] DOB: 21-Dec-1975 Today's Date: 05/27/2019    History of Present Illness 44 y.o. male smoker with DM and h/o bil hip pinning presented for neck and shoulder pain and difficulty with left arm numbness and poor coordination in his L>R arm and in his legs. 05/26/19 underwent C3-T1 laminectomies, foraminotomies, and posterior arthodesis.     Clinical Impression   PTA, pt was living with his mother and son and was independent. Pt currently requiring Mod-Max A for dressing, bathing, and toileting. Pt performing functional mobility with Min Guard A and RW. Pt moving slowly and cautiously due to pain. Initiating education on cervical precautions, bed mobility, grooming, and LB ADLs. Pt would benefit from further acute OT to facilitate safe dc. Recommend dc to home with HHOT for further OT to optimize safety, independence with ADLs, and return to PLOF.     Follow Up Recommendations  Home health OT;Supervision/Assistance - 24 hour ; pending progress   Equipment Recommendations  3 in 1 bedside commode    Recommendations for Other Services PT consult     Precautions / Restrictions Precautions Precautions: Cervical Precaution Booklet Issued: Yes (comment) Precaution Comments: Educated pt on cervical precautions and initated education of compensatory techniques Required Braces or Orthoses: Cervical Brace Cervical Brace: Hard collar;At all times;Other (comment)(Must wear in bed; off for showers)      Mobility Bed Mobility Overal bed mobility: Needs Assistance Bed Mobility: Supine to Sit;Sit to Sidelying;Rolling Rolling: Min assist   Supine to sit: Min assist;HOB elevated   Sit to sidelying: Mod assist General bed mobility comments: Educating pt on log roll technique. Pt declining to perform log roll when exiting bed; supported pts head when elevating trunk. In returning to bed, pt performing log roll with Mod A for  managing BLEs and then Min A to support head during roll to supine.   Transfers Overall transfer level: Needs assistance Equipment used: Rolling walker (2 wheeled) Transfers: Sit to/from Stand Sit to Stand: Min guard         General transfer comment: Min Guard A for safety. Cues for hand placement at RW    Balance Overall balance assessment: Needs assistance Sitting-balance support: No upper extremity supported;Feet supported Sitting balance-Leahy Scale: Fair     Standing balance support: No upper extremity supported;During functional activity Standing balance-Leahy Scale: Fair                             ADL either performed or assessed with clinical judgement   ADL Overall ADL's : Needs assistance/impaired Eating/Feeding: Set up;Sitting   Grooming: Oral care;Min guard;Standing Grooming Details (indicate cue type and reason): Educating pt on compensatory techniques for oral care. Pt demonstrating understanding. Requiring increased time for reach towards faucet as pt with decreased ROM at shouders and moving slowly.  Upper Body Bathing: Moderate assistance;Sitting   Lower Body Bathing: Moderate assistance;Sit to/from stand   Upper Body Dressing : Maximal assistance;Sitting   Lower Body Dressing: Maximal assistance;Sit to/from stand Lower Body Dressing Details (indicate cue type and reason): Pt unable to perform firgure four position Toilet Transfer: Min guard;Ambulation;RW(simulated in room) Toilet Transfer Details (indicate cue type and reason): Min Guard A for safety and cues for safe hand positioning         Functional mobility during ADLs: Min guard;Rolling walker General ADL Comments: Pt with decreased functional performance due to pain and decreased ROM  Vision         Perception     Praxis      Pertinent Vitals/Pain Pain Assessment: 0-10 Pain Score: 9  Pain Location: Neck Pain Descriptors / Indicators:  Constant;Discomfort;Grimacing Pain Intervention(s): Monitored during session;Repositioned;Limited activity within patient's tolerance;Patient requesting pain meds-RN notified     Hand Dominance Right   Extremity/Trunk Assessment Upper Extremity Assessment Upper Extremity Assessment: RUE deficits/detail;LUE deficits/detail RUE Deficits / Details: WFL strength. Limited ROM at shoulders. RUE Coordination: decreased gross motor LUE Deficits / Details: WFL strength. Limited ROM at shoulders. Continues to reports slight shoulder pain. Denies any numbness or tingling. Finger opposition LUE Coordination: decreased gross motor   Lower Extremity Assessment Lower Extremity Assessment: Defer to PT evaluation   Cervical / Trunk Assessment Cervical / Trunk Assessment: Other exceptions Cervical / Trunk Exceptions: S/p Cervical sx   Communication Communication Communication: No difficulties   Cognition Arousal/Alertness: Awake/alert Behavior During Therapy: WFL for tasks assessed/performed;Flat affect Overall Cognitive Status: Within Functional Limits for tasks assessed                                 General Comments: Despite significant pain, pt agreeable to therapy.    General Comments       Exercises     Shoulder Instructions      Home Living Family/patient expects to be discharged to:: Private residence Living Arrangements: Children;Parent(Mother; son Mon-Wed) Available Help at Discharge: Family;Available PRN/intermittently Type of Home: House Home Access: Stairs to enter [employer] of Steps: 3-4 Entrance Stairs-Rails: None Home Layout: Two level;Full bath on main level(His bedroom on main floor)     Bathroom Shower/Tub: Chief Strategy Officer: Standard     Home Equipment: Cane - single point          Prior Functioning/Environment Level of Independence: Independent        Comments: Works full time "builds bridges"         OT Problem List: Decreased strength;Decreased range of motion;Decreased activity tolerance;Impaired balance (sitting and/or standing);Decreased knowledge of use of DME or AE;Decreased knowledge of precautions;Pain      OT Treatment/Interventions: Self-care/ADL training;Therapeutic exercise;Energy conservation;DME and/or AE instruction;Therapeutic activities;Patient/family education    OT Goals(Current goals can be found in the care plan section) Acute Rehab OT Goals Patient Stated Goal: Stop this pain OT Goal Formulation: With patient Time For Goal Achievement: 06/10/19 Potential to Achieve Goals: Good  OT Frequency: Min 3X/week   Barriers to D/C:            Co-evaluation              AM-PAC OT "6 Clicks" Daily Activity     Outcome Measure Help from another person eating meals?: None Help from another person taking care of personal grooming?: A Little Help from another person toileting, which includes using toliet, bedpan, or urinal?: A Little Help from another person bathing (including washing, rinsing, drying)?: A Lot Help from another person to put on and taking off regular upper body clothing?: A Lot Help from another person to put on and taking off regular lower body clothing?: A Lot 6 Click Score: 16   End of Session Equipment Utilized During Treatment: Cervical collar;Rolling walker Nurse Communication: Mobility status;Patient requests pain meds  Activity Tolerance: Patient tolerated treatment well;Patient limited by pain Patient left: in bed;with call bell/phone within reach  OT Visit Diagnosis: Unsteadiness on feet (R26.81);Other abnormalities of gait  and mobility (R26.89);Muscle weakness (generalized) (M62.81);Pain Pain - part of body: (Neck)                Time: 1448-1856 OT Time Calculation (min): 25 min Charges:  OT General Charges $OT Visit: 1 Visit OT Evaluation $OT Eval Moderate Complexity: 1 Mod OT Treatments $Self Care/Home Management : 8-22  mins  Marycatherine Maniscalco MSOT, OTR/L Acute Rehab Pager: [phone] Office: [place] 05/27/2019, 9:39 AM

## 2019-05-28 LAB — GLUCOSE, CAPILLARY
Glucose-Capillary: 37 mg/dL — CL (ref 70–99)
Glucose-Capillary: 111 mg/dL — ABNORMAL HIGH (ref 70–99)
Glucose-Capillary: 92 mg/dL (ref 70–99)
Glucose-Capillary: 80 mg/dL (ref 70–99)
Glucose-Capillary: 95 mg/dL (ref 70–99)
Glucose-Capillary: 123 mg/dL — ABNORMAL HIGH (ref 70–99)
Glucose-Capillary: 154 mg/dL — ABNORMAL HIGH (ref 70–99)

## 2019-05-28 MED ORDER — INSULIN GLARGINE 100 UNIT/ML ~~LOC~~ SOLN
30.0000 [IU] | Freq: Two times a day (BID) | SUBCUTANEOUS | Status: DC
  Administered 2019-05-28 – 2019-05-29 (×2): 30 [IU] via SUBCUTANEOUS
  Filled 2019-05-28 (×3): qty 0.3

## 2019-05-28 MED ORDER — DEXTROSE 50 % IV SOLN
INTRAVENOUS | Status: AC
  Administered 2019-05-28: 50 mL
  Filled 2019-05-28: qty 50

## 2019-05-28 NOTE — Progress Notes (Signed)
CRITICAL VALUE ALERT  Critical Value: Blood Sugar of 37  Date & Time Notied:  05/28/19 0105 BG checked because pt was diaphoretic with no temperature, claiming that he gets like that when he is hypoglycemic.   Provider Notified: Update left for MD Elsner with neurosurgery.  Orders Received/Actions taken: Dextrose injection given along with a cup of juice and peanut butter. BG re-checked 40 minutes later. BG of 111 with stable vitals.

## 2019-05-28 NOTE — Discharge Summary (Signed)
Physician Discharge Summary  Patient ID: [name] MRN: [id number] DOB/AGE: February 11, 1976 44 y.o.  Admit date: 05/26/2019 Discharge date: 05/29/2018  Admission Diagnoses:   Stenosis of cervical spine with myelopathy [name]  Discharge Diagnoses:  Active Problems:   Stenosis of cervical spine with myelopathy [name]   Discharged Condition: Fair  Hospital Course:  43 yo M with DM, OSA, smoker with severe cervical stenosis with OPLL and progressive myelopathy was brought to the hospital for elective C3-T1 PCDF.  He underwent the surgery on 05/26/18.  He was admitted to the hospital.  Postop x-ray showed satisfactory instrumentation.  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 PT was consulted and he was cleared for home.  He had an episode of hypoglycemia which required adjustment of his insulin regimen. His drain was removed on POD#2.  On the day of discharge, his pain was well-controlled with PO meds, he was voiding and eating well, ambulating and sufficiently mobile and deemed ready for discharge.   Consults: PT/OT  Significant Diagnostic Studies: postop x-ray shows satisfactory instrumentation   Discharge Exam: Blood pressure 112/72, pulse 79, temperature 98.2 F (36.8 C), temperature source Oral, resp. rate 18, height 5\' 6"  (1.676 m), weight 125 kg, SpO2 97 %. Awake, alert, Ox3 2/5 L delt, 3/5 biceps, 4+/5 HG bilaterally, otw 5/5 Incision c/d/i  Disposition:   Discharge Instructions    Incentive spirometry RT   Complete by: As directed       Follow-up Information    , MD. Schedule an appointment as soon as possible for a visit in 2 week(s).   Specialty: Neurosurgery Contact information: [address] [place] [phone]           Signed: [phone] 05/28/2019, 8:24 PM

## 2019-05-28 NOTE — Progress Notes (Signed)
Inpatient Diabetes Program Recommendations  AACE/ADA: New Consensus Statement on Inpatient Glycemic Control (2015)  Target Ranges:  Prepandial:   less than 140 mg/dL      Peak postprandial:   less than 180 mg/dL (1-2 hours)      Critically ill patients:  140 - 180 mg/dL   Lab Results  Component Value Date   GLUCAP 80 05/28/2019   HGBA1C 7.5 (H) 05/24/2019    Review of Glycemic Control Results for [name], [name] (MRN [id number]) as of 05/28/2019 10:55  Ref. Range 05/28/2019 01:04 05/28/2019 01:49 05/28/2019 04:36 05/28/2019 07:43  Glucose-Capillary Latest Ref Range: 70 - 99 mg/dL 37 (LL) 614 (H) 92 80   Diabetes history: Type 2 DM Outpatient Diabetes medications: Humalog 10-15 units TID, Toujeo 54 units BID, Metformin 1000 mg BID, Actos 15 mg QD Current orders for Inpatient glycemic control: Lantus 54 units BID, Novolog 0-20 units TID, Novolog 0-5 units QHS, Metformin 1000 mg BID, Actos 15 mg QD  Inpatient Diabetes Program Recommendations:    Noted hypoglycemia of 37 mg/dL following Lantus dose. Consider decreasing Lantus to 30 units BID.   Thanks, Lujean Rave, MSN, RNC-OB Diabetes Coordinator [phone] (8a-5p)

## 2019-05-28 NOTE — Progress Notes (Signed)
Occupational Therapy Treatment Patient Details Name: [name] MRN: [id number] DOB: Feb 27, 1976 Today's Date: 05/28/2019    History of present illness 44 y.o. male smoker with DM and h/o bil hip pinning presented for neck and shoulder pain and difficulty with left arm numbness and poor coordination in his L>R arm and in his legs. 05/26/19 underwent C3-T1 laminectomies, foraminotomies, and posterior arthodesis.    OT comments  Pt progressing towards established OT goals. Proving pt with education and handout on AE for LB ADLs; pt demonstrating understanding and donning/doffing pants and socks with Min Guard A. Educating pt on compensatory techniques for toilet hygiene; pt demonstrating understanding. Initiated education on tub trnasfer with 3n1; will need further education and practice. Pt performing functional mobility in hallway with Min Guard A, RW, and increased time. Continue to recommend dc to home and will continue to follow acutely as admitted.    Follow Up Recommendations  No OT follow up;Supervision/Assistance - 24 hour    Equipment Recommendations  3 in 1 bedside commode    Recommendations for Other Services PT consult    Precautions / Restrictions Precautions Precautions: Cervical Precaution Booklet Issued: Yes (comment)(In room) Required Braces or Orthoses: Cervical Brace Cervical Brace: Hard collar;At all times;Other (comment)(Must wear in bed; off for showers) Restrictions Weight Bearing Restrictions: No       Mobility Bed Mobility Overal bed mobility: Needs Assistance Bed Mobility: Sidelying to Sit;Rolling Rolling: Min guard Sidelying to sit: Min guard       General bed mobility comments: Pt performing log roll technique with increased time and cues. Min Guard A for safety  Transfers Overall transfer level: Needs assistance Equipment used: None Transfers: Sit to/from Stand Sit to Stand: Min guard         General transfer comment: Min Guard A for  safety    Balance Overall balance assessment: Needs assistance Sitting-balance support: No upper extremity supported;Feet supported Sitting balance-Leahy Scale: Fair     Standing balance support: No upper extremity supported;During functional activity Standing balance-Leahy Scale: Fair                             ADL either performed or assessed with clinical judgement   ADL Overall ADL's : Needs assistance/impaired     Grooming: Standing;Wash/dry hands;Supervision/safety             Upper Body Dressing Details (indicate cue type and reason): Will need education on UB dressing techniques and brace management Lower Body Dressing: Min guard;Sit to/from stand;With adaptive equipment;Cueing for sequencing Lower Body Dressing Details (indicate cue type and reason): Educating pt on use of AE for LB dressing. Pt demonstrating understanding and donning/doffing pants and underwear with Min Guard A for safety in standing Toilet Transfer: Min guard;Ambulation;RW;Regular Toilet;Grab bars Toilet Transfer Details (indicate cue type and reason): Min Guard A for safety and cues for safe hand positioning         Functional mobility during ADLs: Min guard;Rolling walker       Vision       Perception     Praxis      Cognition Arousal/Alertness: Awake/alert Behavior During Therapy: Flat affect Overall Cognitive Status: Within Functional Limits for tasks assessed                                          Exercises  Shoulder Instructions       General Comments      Pertinent Vitals/ Pain       Pain Assessment: Faces Faces Pain Scale: Hurts even more Pain Location: Neck Pain Descriptors / Indicators: Constant;Discomfort;Grimacing Pain Intervention(s): Monitored during session;Limited activity within patient's tolerance;Repositioned  Home Living                                          Prior Functioning/Environment               Frequency  Min 3X/week        Progress Toward Goals  OT Goals(current goals can now be found in the care plan section)  Progress towards OT goals: Progressing toward goals  Acute Rehab OT Goals Patient Stated Goal: Stop this pain OT Goal Formulation: With patient Time For Goal Achievement: 06/10/19 Potential to Achieve Goals: Good ADL Goals Pt Will Perform Upper Body Dressing: with set-up;with supervision;sitting Pt Will Perform Lower Body Dressing: with set-up;with supervision;sit to/from stand;with adaptive equipment Pt Will Transfer to Toilet: with set-up;with supervision;ambulating;bedside commode Pt Will Perform Toileting - Clothing Manipulation and hygiene: with set-up;with supervision;sitting/lateral leans;sit to/from stand Additional ADL Goal #1: Pt will perform bed mobility with Supervision in preparation for ADLs  Plan Discharge plan remains appropriate    Co-evaluation                 AM-PAC OT "6 Clicks" Daily Activity     Outcome Measure   Help from another person eating meals?: None Help from another person taking care of personal grooming?: A Little Help from another person toileting, which includes using toliet, bedpan, or urinal?: A Little Help from another person bathing (including washing, rinsing, drying)?: A Lot Help from another person to put on and taking off regular upper body clothing?: A Lot Help from another person to put on and taking off regular lower body clothing?: A Lot 6 Click Score: 16    End of Session Equipment Utilized During Treatment: Cervical collar;Rolling walker;Gait belt  OT Visit Diagnosis: Unsteadiness on feet (R26.81);Other abnormalities of gait and mobility (R26.89);Muscle weakness (generalized) (M62.81);Pain Pain - part of body: (Neck)   Activity Tolerance Patient tolerated treatment well;Patient limited by pain   Patient Left in chair;with call bell/phone within reach   Nurse Communication Mobility  status        Time: 0254-2706 OT Time Calculation (min): 32 min  Charges: OT General Charges $OT Visit: 1 Visit OT Treatments $Self Care/Home Management : 23-37 mins  [place], OTR/L Acute Rehab Pager: [phone] Office: [place] 05/28/2019, 11:49 AM

## 2019-05-28 NOTE — Progress Notes (Signed)
Subjective: Patient reports left arm/shoulder burning pain; neck pain improved slightly.  Cleared by PT.  Objective: Vital signs in last 24 hours: Temp:  [97.8 F (36.6 C)-98.9 F (37.2 C)] 98.9 F (37.2 C) (03/26 0746) Pulse Rate:  [78-93] 89 (03/26 0746) Resp:  [18-24] 18 (03/26 0746) BP: (119-167)/(82-95) 128/86 (03/26 0746) SpO2:  [96 %-100 %] 96 % (03/26 0746)  Intake/Output from previous day: 03/25 0701 - 03/26 0700 In: 0  Out: 2245 [Urine:2125; Drains:120] Intake/Output this shift: No intake/output data recorded.  Left shoulder abd 2/5, biceps 4-/5.  Otw 5/5 strength. Incision c/d/i.  Lab Results: No results for input(s): WBC, HGB, HCT, PLT in the last 72 hours. BMET No results for input(s): NA, K, CL, CO2, GLUCOSE, BUN, CREATININE, CALCIUM in the last 72 hours.  Studies/Results: DG Cervical Spine 2 or 3 views  Result Date: 05/27/2019 CLINICAL DATA:  Postoperative evaluation. Prior cervical spine fusion. EXAM: CERVICAL SPINE - 2-3 VIEW COMPARISON:  05/26/2019. FINDINGS: Posterior fusion C3 through T1. Hardware intact. Anatomic alignment. Diffuse degenerative change. Wiring noted over the cervicothoracic spine. Surgical staples noted posteriorly. IMPRESSION: Posterior fusion C3 through T1. Hardware intact. Anatomic alignment. Electronically Signed   By: Maisie Fus  Register   On: 05/27/2019 07:57   DG Cervical Spine 2-3 Views  Result Date: 05/26/2019 CLINICAL DATA:  Posterior cervical fusion EXAM: CERVICAL SPINE - 2-3 VIEW; DG C-ARM 1-60 MIN COMPARISON:  05/18/2019 FLUOROSCOPY TIME:  Fluoroscopy Time:  19 seconds Radiation Exposure Index (if provided by the fluoroscopic device): Not available Number of Acquired Spot Images: 3 FINDINGS: Initial images demonstrate surgical instrument at the C6-7 level. Subsequent changes of posterior fusion from C3-T1 are noted. IMPRESSION: Cervical fusion as described. Electronically Signed   By: Alcide Clever [name]   On: 05/26/2019 11:51   DG  C-Arm 1-60 Min  Result Date: 05/26/2019 CLINICAL DATA:  Posterior cervical fusion EXAM: CERVICAL SPINE - 2-3 VIEW; DG C-ARM 1-60 MIN COMPARISON:  05/18/2019 FLUOROSCOPY TIME:  Fluoroscopy Time:  19 seconds Radiation Exposure Index (if provided by the fluoroscopic device): Not available Number of Acquired Spot Images: 3 FINDINGS: Initial images demonstrate surgical instrument at the C6-7 level. Subsequent changes of posterior fusion from C3-T1 are noted. IMPRESSION: Cervical fusion as described. Electronically Signed   By: Alcide Clever [name]   On: 05/26/2019 11:51    Assessment/Plan: POD#2 s/p C3-T1 PCDF - x-rays reviewed, instrumentation satisfactory - had hypoglycemic episode last night; have reduced Lantus to 30 - discussed with patient natural history of C5 palsy, with good recovery expected. - Cleared by PT, awaiting OT - drain removed today - okay to resume aspirin on Monday, anticoagulation 14 days after surgery. - likely DC tomorrow   Bedelia Person 05/28/2019, 11:10 AM

## 2019-05-28 NOTE — Discharge Instructions (Addendum)
Call primary care physician after discharge to set up appointment for continued diabetes care.  Resume daily aspirin on Wednesday (1 week after surgery)  Percocet prescription has been sent to his [facility] pharmacy

## 2019-05-28 NOTE — Anesthesia Postprocedure Evaluation (Signed)
Anesthesia Post Note  Patient: [name]  Procedure([name]) Performed: POSTERIOR CERVICAL FUSION WITH LATERAL MASS FIXATION CERVICAL THREE- THORACIC ONE (N/A Spine Cervical)     Patient location during evaluation: PACU Anesthesia Type: General Level of consciousness: awake and alert Pain management: pain level controlled Vital Signs Assessment: post-procedure vital signs reviewed and stable Respiratory status: spontaneous breathing, nonlabored ventilation, respiratory function stable and patient connected to nasal cannula oxygen Cardiovascular status: blood pressure returned to baseline and stable Postop Assessment: no apparent nausea or vomiting Anesthetic complications: no    Last Vitals:  Vitals:   05/28/19 1146 05/28/19 1730  BP: 133/90 112/72  Pulse: 89 79  Resp: 18 18  Temp: 37 C 36.8 C  SpO2: 98% 97%    Last Pain:  Vitals:   05/28/19 1830  TempSrc:   PainSc: 8                  [name]

## 2019-05-29 LAB — GLUCOSE, CAPILLARY: Glucose-Capillary: 142 mg/dL — ABNORMAL HIGH (ref 70–99)

## 2019-05-29 MED ORDER — DOCUSATE SODIUM 100 MG PO CAPS: 100.0000 mg | ORAL_CAPSULE | Freq: Two times a day (BID) | ORAL | 0 refills | Status: DC

## 2019-05-29 MED ORDER — OXYCODONE-ACETAMINOPHEN 5-325 MG PO TABS: 2.0000 | ORAL_TABLET | ORAL | 0 refills | Status: DC | PRN

## 2019-05-29 MED ORDER — METHOCARBAMOL 500 MG PO TABS: 500.0000 mg | ORAL_TABLET | Freq: Three times a day (TID) | ORAL | 1 refills | Status: DC | PRN

## 2019-05-29 NOTE — Plan of Care (Signed)
Problem: Education: Goal: Ability to verbalize activity precautions or restrictions will improve 05/29/2019 1014 by Jill Side, RN Outcome: Adequate for Discharge 05/29/2019 1013 by Jill Side, RN Outcome: Adequate for Discharge Goal: Knowledge of the prescribed therapeutic regimen will improve 05/29/2019 1014 by Jill Side, RN Outcome: Adequate for Discharge 05/29/2019 1013 by Jill Side, RN Outcome: Adequate for Discharge Goal: Understanding of discharge needs will improve 05/29/2019 1014 by Jill Side, RN Outcome: Adequate for Discharge 05/29/2019 1013 by Jill Side, RN Outcome: Adequate for Discharge   Problem: Activity: Goal: Ability to avoid complications of mobility impairment will improve 05/29/2019 1014 by Jill Side, RN Outcome: Adequate for Discharge 05/29/2019 1013 by Jill Side, RN Outcome: Adequate for Discharge Goal: Ability to tolerate increased activity will improve 05/29/2019 1014 by Jill Side, RN Outcome: Adequate for Discharge 05/29/2019 1013 by Jill Side, RN Outcome: Adequate for Discharge Goal: Will remain free from falls 05/29/2019 1014 by Jill Side, RN Outcome: Adequate for Discharge 05/29/2019 1013 by Jill Side, RN Outcome: Adequate for Discharge   Problem: Bowel/Gastric: Goal: Gastrointestinal status for postoperative course will improve 05/29/2019 1014 by Jill Side, RN Outcome: Adequate for Discharge 05/29/2019 1013 by Jill Side, RN Outcome: Adequate for Discharge   Problem: Clinical Measurements: Goal: Ability to maintain clinical measurements within normal limits will improve 05/29/2019 1014 by Jill Side, RN Outcome: Adequate for Discharge 05/29/2019 1013 by Jill Side, RN Outcome: Adequate for Discharge Goal: Postoperative complications will be avoided or minimized 05/29/2019 1014 by Jill Side, RN Outcome: Adequate for Discharge 05/29/2019 1013 by Jill Side, RN Outcome: Adequate for Discharge Goal: Diagnostic test results will improve 05/29/2019 1014 by Jill Side, RN Outcome: Adequate for Discharge 05/29/2019 1013 by Jill Side, RN Outcome: Adequate for Discharge   Problem: Pain Management: Goal: Pain level will decrease 05/29/2019 1014 by Jill Side, RN Outcome: Adequate for Discharge 05/29/2019 1013 by Jill Side, RN Outcome: Adequate for Discharge   Problem: Skin Integrity: Goal: Will show signs of wound healing 05/29/2019 1014 by Jill Side, RN Outcome: Adequate for Discharge 05/29/2019 1013 by Jill Side, RN Outcome: Adequate for Discharge   Problem: Health Behavior/Discharge Planning: Goal: Identification of resources available to assist in meeting health care needs will improve 05/29/2019 1014 by Jill Side, RN Outcome: Adequate for Discharge 05/29/2019 1013 by Jill Side, RN Outcome: Adequate for Discharge   Problem: Bladder/Genitourinary: Goal: Urinary functional status for postoperative course will improve 05/29/2019 1014 by Jill Side, RN Outcome: Adequate for Discharge 05/29/2019 1013 by Jill Side, RN Outcome: Adequate for Discharge   Problem: Education: Goal: Knowledge of General Education information will improve Description: Including pain rating scale, medication(s)/side effects and non-pharmacologic comfort measures 05/29/2019 1014 by Jill Side, RN Outcome: Adequate for Discharge 05/29/2019 1013 by Jill Side, RN Outcome: Adequate for Discharge   Problem: Health Behavior/Discharge Planning: Goal: Ability to manage health-related needs will improve 05/29/2019 1014 by Jill Side, RN Outcome: Adequate for Discharge 05/29/2019 1013 by Jill Side, RN Outcome: Adequate for Discharge   Problem: Clinical Measurements: Goal: Ability to maintain clinical measurements within normal limits will improve 05/29/2019 1014 by Jill Side, RN Outcome:  Adequate for Discharge 05/29/2019 1013 by Jill Side, RN Outcome: Adequate for Discharge Goal: Will remain free from infection 05/29/2019 1014 by Jill Side, RN Outcome: Adequate for Discharge 05/29/2019  1013 by Ginnie Smart, RN Outcome: Adequate for Discharge Goal: Diagnostic test results will improve 05/29/2019 1014 by Ginnie Smart, RN Outcome: Adequate for Discharge 05/29/2019 1013 by Ginnie Smart, RN Outcome: Adequate for Discharge Goal: Respiratory complications will improve 05/29/2019 1014 by Ginnie Smart, RN Outcome: Adequate for Discharge 05/29/2019 1013 by Ginnie Smart, RN Outcome: Adequate for Discharge Goal: Cardiovascular complication will be avoided 05/29/2019 1014 by Ginnie Smart, RN Outcome: Adequate for Discharge 05/29/2019 1013 by Ginnie Smart, RN Outcome: Adequate for Discharge   Problem: Activity: Goal: Risk for activity intolerance will decrease 05/29/2019 1014 by Ginnie Smart, RN Outcome: Adequate for Discharge 05/29/2019 1013 by Ginnie Smart, RN Outcome: Adequate for Discharge   Problem: Nutrition: Goal: Adequate nutrition will be maintained 05/29/2019 1014 by Ginnie Smart, RN Outcome: Adequate for Discharge 05/29/2019 1013 by Ginnie Smart, RN Outcome: Adequate for Discharge   Problem: Coping: Goal: Level of anxiety will decrease 05/29/2019 1014 by Ginnie Smart, RN Outcome: Adequate for Discharge 05/29/2019 1013 by Ginnie Smart, RN Outcome: Adequate for Discharge   Problem: Elimination: Goal: Will not experience complications related to bowel motility 05/29/2019 1014 by Ginnie Smart, RN Outcome: Adequate for Discharge 05/29/2019 1013 by Ginnie Smart, RN Outcome: Adequate for Discharge Goal: Will not experience complications related to urinary retention 05/29/2019 1014 by Ginnie Smart, RN Outcome: Adequate for Discharge 05/29/2019 1013 by Ginnie Smart, RN Outcome: Adequate for Discharge   Problem:  Pain Managment: Goal: General experience of comfort will improve 05/29/2019 1014 by Ginnie Smart, RN Outcome: Adequate for Discharge 05/29/2019 1013 by Ginnie Smart, RN Outcome: Adequate for Discharge   Problem: Safety: Goal: Ability to remain free from injury will improve 05/29/2019 1014 by Ginnie Smart, RN Outcome: Adequate for Discharge 05/29/2019 1013 by Ginnie Smart, RN Outcome: Adequate for Discharge   Problem: Skin Integrity: Goal: Risk for impaired skin integrity will decrease 05/29/2019 1014 by Ginnie Smart, RN Outcome: Adequate for Discharge 05/29/2019 1013 by Ginnie Smart, RN Outcome: Adequate for Discharge   Problem: Acute Rehab OT Goals (only OT should resolve) Goal: Pt. Will Perform Upper Body Dressing Outcome: Adequate for Discharge Goal: Pt. Will Perform Lower Body Dressing Outcome: Adequate for Discharge Goal: Pt. Will Transfer To Toilet Outcome: Adequate for Discharge Goal: Pt. Will Perform Toileting-Clothing Manipulation Outcome: Adequate for Discharge Goal: OT Additional ADL Goal #1 Outcome: Adequate for Discharge

## 2019-05-29 NOTE — Progress Notes (Signed)
Occupational Therapy Treatment Patient Details Name: [name] MRN: [id number] DOB: 09-Aug-1975 Today's Date: 05/29/2019    History of present illness 44 y.o. male smoker with DM and h/o bil hip pinning presented for neck and shoulder pain and difficulty with left arm numbness and poor coordination in his L>R arm and in his legs. 05/26/19 underwent C3-T1 laminectomies, foraminotomies, and posterior arthodesis.    OT comments  Pt demonstrates improving ability to perform ADLs, but is limited this date due to pain.  He was instructed how to don/doff collar and how to change and manage pads.  Reinforced neck precautions.   Follow Up Recommendations  No OT follow up;Supervision/Assistance - 24 hour    Equipment Recommendations  3 in 1 bedside commode    Recommendations for Other Services PT consult    Precautions / Restrictions Precautions Precautions: Cervical Precaution Booklet Issued: Yes (comment) Required Braces or Orthoses: Cervical Brace Cervical Brace: Hard collar;At all times;Other (comment)       Mobility Bed Mobility                  Transfers                      Balance                                           ADL either performed or assessed with clinical judgement   ADL Overall ADL's : Needs assistance/impaired                                       General ADL Comments: Pt instructed how to don/doff collar and able to demonstrate independence.  He was also instructed how to change the pads.  Discussed recommendation for tub seat.  Reinforced neck precautions.  Pt reports he was instructed on AE, and doesn't need further instruction.  He states his family will assist if needed      Vision       Perception     Praxis      Cognition Arousal/Alertness: Awake/alert Behavior During Therapy: Flat affect Overall Cognitive Status: Within Functional Limits for tasks assessed                                          Exercises     Shoulder Instructions       General Comments      Pertinent Vitals/ Pain       Pain Assessment: 0-10 Faces Pain Scale: Hurts whole lot Pain Location: Neck Pain Descriptors / Indicators: Constant;Discomfort;Grimacing Pain Intervention(s): Monitored during session;Limited activity within patient's tolerance  Home Living                                          Prior Functioning/Environment              Frequency  Min 3X/week        Progress Toward Goals  OT Goals(current goals can now be found in the care plan section)  Progress towards OT goals: Progressing toward goals     Plan  Discharge plan needs to be updated    Co-evaluation                 AM-PAC OT "6 Clicks" Daily Activity     Outcome Measure   Help from another person eating meals?: None Help from another person taking care of personal grooming?: A Little Help from another person toileting, which includes using toliet, bedpan, or urinal?: A Little Help from another person bathing (including washing, rinsing, drying)?: A Little Help from another person to put on and taking off regular upper body clothing?: A Little Help from another person to put on and taking off regular lower body clothing?: A Lot 6 Click Score: 18    End of Session Equipment Utilized During Treatment: Cervical collar;Rolling walker;Gait belt  OT Visit Diagnosis: Pain Pain - part of body: (neck )   Activity Tolerance Patient tolerated treatment well;Patient limited by pain   Patient Left in chair;with call bell/phone within reach   Nurse Communication          Time: 4854-6270 OT Time Calculation (min): 17 min  Charges: OT General Charges $OT Visit: 1 Visit OT Treatments $Self Care/Home Management : 8-22 mins  Nilsa Nutting., OTR/L Acute Rehabilitation Services Pager [phone] Office [phone]    Lucille Passy M 05/29/2019, 4:30 PM

## 2019-05-29 NOTE — Plan of Care (Signed)

## 2019-05-29 NOTE — Progress Notes (Signed)
Patient and daughter educated on AVS at this time.  All discharge instructions informed on and all questions answered.  Reviewed information for follow up appointments and where to pick up prescription medications.  Patient taken down in wheelchair to daughters car.

## 2019-05-29 NOTE — Discharge Summary (Signed)
Physician Discharge Summary  Patient ID: [name] MRN: [id number] DOB/AGE: 42/20/1977 42 y.o.  Admit date: 05/26/2019 Discharge date: 05/29/2019  Admission Diagnoses: Cervical stenosis, cervical myelopathy, cervicalgia  Discharge Diagnoses: The same Active Problems:   Stenosis of cervical spine with myelopathy [facility])   Discharged Condition: good  Hospital Course: Dr. Marcello Moores performed a C3-T1 posterior decompression, instrumentation and fusion on the patient on 05/26/2019.  The patient's postoperative course was unremarkable.  On postoperative day #3 the patient requested discharge to home.  He was given oral and written discharge instructions.  All his questions were answered.  Consults: None Significant Diagnostic Studies: None Treatments: C3-T1 posterior decompression, instrumentation and fusion. Discharge Exam: Blood pressure (!) 157/97, pulse 85, temperature 98.3 F (36.8 C), temperature source Oral, resp. rate 16, height 5\' 6"  (1.676 m), weight 125 kg, SpO2 99 %. The patient is alert and pleasant.  He is moving all 4 extremities well.  Disposition: Home  Discharge Instructions    Call MD for:  difficulty breathing, headache or visual disturbances   Complete by: As directed    Call MD for:  extreme fatigue   Complete by: As directed    Call MD for:  hives   Complete by: As directed    Call MD for:  persistant dizziness or light-headedness   Complete by: As directed    Call MD for:  persistant nausea and vomiting   Complete by: As directed    Call MD for:  redness, tenderness, or signs of infection (pain, swelling, redness, odor or green/yellow discharge around incision site)   Complete by: As directed    Call MD for:  severe uncontrolled pain   Complete by: As directed    Call MD for:  temperature >100.4   Complete by: As directed    Diet - low sodium heart healthy   Complete by: As directed    Discharge instructions   Complete by: As directed    Call  [phone] for a followup appointment. Take a stool softener while you are using pain medications.   Driving Restrictions   Complete by: As directed    Do not drive for 2 weeks.   Incentive spirometry RT   Complete by: As directed    Increase activity slowly   Complete by: As directed    Lifting restrictions   Complete by: As directed    Do not lift more than 5 pounds. No excessive bending or twisting.   May shower / Bathe   Complete by: As directed    Remove the dressing for 3 days after surgery.  You may shower, but leave the incision alone.   No dressing needed   Complete by: As directed      Allergies as of 05/29/2019      Reactions   Losartan Swelling, Rash   Patient says 'he's not 100% sure if it was losartan, but he thinks it is." He knows it was a blood pressure medicine.      Medication List    STOP taking these medications   aspirin 81 MG EC tablet   cyclobenzaprine 10 MG tablet Commonly known as: FLEXERIL     TAKE these medications   amLODipine 10 MG tablet Commonly known as: NORVASC Take 1 tablet (10 mg total) by mouth daily.   docusate sodium 100 MG capsule Commonly known as: COLACE Take 1 capsule (100 mg total) by mouth 2 (two) times daily.   gabapentin 300 MG capsule Commonly known as: NEURONTIN Take  300 mg by mouth 2 (two) times daily.   HumaLOG KwikPen 100 UNIT/ML KwikPen Generic drug: insulin lispro Inject 10-15 Units into the skin 3 (three) times daily before meals.   metFORMIN 500 MG 24 hr tablet Commonly known as: GLUCOPHAGE-XR Take 2 tablets (1,000 mg total) by mouth 2 (two) times daily with a meal.   methocarbamol 500 MG tablet Commonly known as: ROBAXIN Take 1 tablet (500 mg total) by mouth every 8 (eight) hours as needed for muscle spasms. What changed: when to take this   oxyCODONE-acetaminophen 5-325 MG tablet Commonly known as: PERCOCET/ROXICET Take 2 tablets by mouth every 4 (four) hours as needed for moderate pain.    pioglitazone 15 MG tablet Commonly known as: ACTOS Take 15 mg by mouth daily.   propranolol 10 MG tablet Commonly known as: INDERAL Take 10 mg by mouth 3 (three) times daily.   TOUJEO SOLOSTAR [place] Inject 54 Units into the skin 2 (two) times daily.      Follow-up Information    Bedelia Person, MD. Schedule an appointment as soon as possible for a visit in 2 week(s).   Specialty: Neurosurgery Contact information: [address] [place] [address] [phone]           Signed: Cristi Loron 05/29/2019, 9:59 AM

## 2019-07-20 DIAGNOSIS — G4700 Insomnia, unspecified: Secondary | ICD-10-CM | POA: Insufficient documentation

## 2019-08-10 ENCOUNTER — Telehealth: Payer: Self-pay | Admitting: Physical Medicine and Rehabilitation

## 2019-08-10 NOTE — Telephone Encounter (Signed)
Patient called.   Requesting a call back to set up an appointment.   Call back: [phone]

## 2019-08-11 ENCOUNTER — Telehealth: Payer: Self-pay | Admitting: Physical Medicine and Rehabilitation

## 2019-08-11 NOTE — Telephone Encounter (Signed)
Pt called returning a missed call regarding an appt.  [phone]

## 2019-08-11 NOTE — Telephone Encounter (Signed)
Pt would like to have Lt hip inj, pt had one 03/31/19 and states it helped him out a lot. If pt states no traumas/injuries ok to schedule?

## 2019-08-11 NOTE — Telephone Encounter (Signed)
Called patient and left message asking him to let us know what type of appointment he wants. He has had injections of both hips in the past.

## 2019-08-11 NOTE — Telephone Encounter (Signed)
ok 

## 2019-08-11 NOTE — Telephone Encounter (Signed)
Pt called stating he would like to get scheduled for a L hip inj.   [phone]

## 2019-08-11 NOTE — Telephone Encounter (Signed)
Called pt and lvm #1 to get scheduled for Lt hip inj.

## 2019-08-12 NOTE — Telephone Encounter (Signed)
See previous message

## 2019-08-12 NOTE — Telephone Encounter (Signed)
Scheduled for 6/23 at 1330.

## 2019-08-24 ENCOUNTER — Ambulatory Visit (HOSPITAL_COMMUNITY)
Admission: EM | Admit: 2019-08-24 | Discharge: 2019-08-24 | Disposition: A | Discharge status: Home / Self Care | Payer: BC Managed Care – PPO | Attending: Family Medicine | Admitting: Family Medicine

## 2019-08-24 ENCOUNTER — Other Ambulatory Visit: Payer: Self-pay

## 2019-08-24 ENCOUNTER — Encounter (HOSPITAL_COMMUNITY): Payer: Self-pay | Admitting: Emergency Medicine

## 2019-08-24 DIAGNOSIS — E1142 Type 2 diabetes mellitus with diabetic polyneuropathy: Secondary | ICD-10-CM | POA: Insufficient documentation

## 2019-08-24 DIAGNOSIS — Z8249 Family history of ischemic heart disease and other diseases of the circulatory system: Secondary | ICD-10-CM | POA: Insufficient documentation

## 2019-08-24 DIAGNOSIS — Z20822 Contact with and (suspected) exposure to covid-19: Secondary | ICD-10-CM | POA: Insufficient documentation

## 2019-08-24 DIAGNOSIS — Z794 Long term (current) use of insulin: Secondary | ICD-10-CM | POA: Insufficient documentation

## 2019-08-24 DIAGNOSIS — J069 Acute upper respiratory infection, unspecified: Secondary | ICD-10-CM | POA: Diagnosis not present

## 2019-08-24 DIAGNOSIS — G4733 Obstructive sleep apnea (adult) (pediatric): Secondary | ICD-10-CM | POA: Insufficient documentation

## 2019-08-24 DIAGNOSIS — Z79899 Other long term (current) drug therapy: Secondary | ICD-10-CM | POA: Diagnosis not present

## 2019-08-24 DIAGNOSIS — E785 Hyperlipidemia, unspecified: Secondary | ICD-10-CM | POA: Diagnosis not present

## 2019-08-24 DIAGNOSIS — Z888 Allergy status to other drugs, medicaments and biological substances status: Secondary | ICD-10-CM | POA: Diagnosis not present

## 2019-08-24 DIAGNOSIS — F17210 Nicotine dependence, cigarettes, uncomplicated: Secondary | ICD-10-CM | POA: Diagnosis not present

## 2019-08-24 DIAGNOSIS — I10 Essential (primary) hypertension: Secondary | ICD-10-CM | POA: Insufficient documentation

## 2019-08-24 DIAGNOSIS — E669 Obesity, unspecified: Secondary | ICD-10-CM | POA: Diagnosis not present

## 2019-08-24 DIAGNOSIS — Z8673 Personal history of transient ischemic attack (TIA), and cerebral infarction without residual deficits: Secondary | ICD-10-CM | POA: Insufficient documentation

## 2019-08-24 DIAGNOSIS — J029 Acute pharyngitis, unspecified: Secondary | ICD-10-CM | POA: Diagnosis present

## 2019-08-24 LAB — SARS CORONAVIRUS 2 (TAT 6-24 HRS): SARS Coronavirus 2: NEGATIVE

## 2019-08-24 MED ORDER — CETIRIZINE HCL 10 MG PO TABS: 10.0000 mg | ORAL_TABLET | Freq: Every day | ORAL | 0 refills | Status: DC

## 2019-08-24 MED ORDER — FLUTICASONE PROPIONATE 50 MCG/ACT NA SUSP: 1.0000 | Freq: Every day | NASAL | 2 refills | Status: DC

## 2019-08-24 NOTE — ED Triage Notes (Signed)
Sore throat and cough for 3 days.   J&J vaccine 1-1.5 months ago.

## 2019-08-24 NOTE — ED Provider Notes (Signed)
[place]    CSN: [id number] Arrival date & time: 08/24/19  0932      History   Chief Complaint Chief Complaint  Patient presents with  . Sore Throat  . Cough    HPI [name] is a 44 y.o. male.   Patient is a 44 year old male with past medical history of pancreatitis, anxiety, arthritis, chronic back pain, dyslipidemia, MRSA, hypertension, obesity, CPAP, peripheral neuropathy, stroke, type 2 diabetes.  He presents today with approximate 3 days of sore throat, cough, nasal congestion.  Symptoms have been constant.  He is not take anything for symptoms.  Was Covid vaccinated approximately a month and a half ago.  No recent sick contacts or recent traveling.  ROS per HPI      Past Medical History:  Diagnosis Date  . Acute pancreatitis 07/2010  . Anxiety   . Arthritis    "lower back and shoulders" (10/07/2017)  . Chronic lower back pain   . Dyslipidemia   . Headache    "weekly" (10/07/2017)  . Hypercholesteremia   . Hypertension   . MRSA (methicillin resistant staph aureus) culture positive   . Obesity   . Obesity   . OSA on CPAP   . Peripheral neuropathy   . Scoliosis   . Sleep apnea    "can't tolerate any of the masks" (10/07/2017)  . Stroke [facility])    stroke-like 2.5 years ago  . TIA (transient ischemic attack)   . Type II diabetes mellitus ([place])   . Umbilical hernia     Patient Active Problem List   Diagnosis Date Noted  . Stenosis of cervical spine with myelopathy ([place]) 05/26/2019  . Thrombocytopenia, unspecified ([place]) 07/03/2018  . Type 2 diabetes mellitus without complication ([place]) 35/57/3220  . OSA (obstructive sleep apnea) 07/03/2018  . Excessive daytime sleepiness 07/03/2018  . Class 3 severe obesity due to excess calories with serious comorbidity and body mass index (BMI) of 40.0 to 44.9 in adult ([place]) 07/03/2018  . Non-seasonal allergic rhinitis due to pollen 07/03/2018  . TIA (transient ischemic attack) 10/07/2017    Past Surgical  History:  Procedure Laterality Date  . HIP PINNING Bilateral    "they are worn out" (10/07/2017)  . PICC LINE INSERTION  07/2010   "in hospital for ~ 2 months; took PICC out when I was discharged"  . POSTERIOR CERVICAL FUSION/FORAMINOTOMY N/A 05/26/2019   Procedure: POSTERIOR CERVICAL FUSION WITH LATERAL MASS FIXATION CERVICAL THREE- THORACIC ONE;  Surgeon: Vallarie Mare, MD;  Location: [place];  Service: Neurosurgery;  Laterality: N/A;  posterior, Cervical/Thoracic  . 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 Authorizing Provider  amLODipine (NORVASC) 10 MG tablet Take 1 tablet (10 mg total) by mouth daily. 10/07/17  Yes Alphonzo Grieve, MD  Dapagliflozin Propanediol (FARXIGA PO) Take by mouth.   Yes [provider]  escitalopram (LEXAPRO) 20 MG tablet Take 20 mg by mouth daily.   Yes [provider]  gabapentin (NEURONTIN) 300 MG capsule Take 300 mg by mouth 2 (two) times daily.   Yes [provider]  HUMALOG KWIKPEN 100 UNIT/ML KwikPen Inject 10-15 Units into the skin 3 (three) times daily before meals.  06/12/18  Yes [provider]  Insulin Glargine (TOUJEO SOLOSTAR L[place]) Inject 54 Units into the skin 2 (two) times  daily.    Yes [provider]  metFORMIN (GLUCOPHAGE-XR) 500 MG 24 hr tablet Take 2 tablets (1,000 mg total) by mouth 2 (two) times daily with a meal. 10/07/17  Yes Nyra Market, MD  methocarbamol (ROBAXIN) 500 MG tablet Take 1 tablet (500 mg total) by mouth every 8 (eight) hours as needed for muscle spasms. 05/29/19  Yes Tressie Stalker, MD  propranolol (INDERAL) 10 MG tablet Take 10 mg by mouth 3 (three) times daily.   Yes [provider]  cetirizine (ZYRTEC) 10 MG tablet Take 1 tablet (10 mg total) by mouth daily. 08/24/19   Dahlia Byes A, NP  docusate sodium (COLACE)  100 MG capsule Take 1 capsule (100 mg total) by mouth 2 (two) times daily. 05/29/19   Tressie Stalker, MD  fluticasone [facility]) 50 MCG/ACT nasal spray Place 1 spray into both nostrils daily. 08/24/19   Dahlia Byes A, NP  oxyCODONE-acetaminophen (PERCOCET/ROXICET) 5-325 MG tablet Take 2 tablets by mouth every 4 (four) hours as needed for moderate pain. 05/29/19   Tressie Stalker, MD  pioglitazone (ACTOS) 15 MG tablet Take 15 mg by mouth daily. 06/06/17   [provider]    Family History Family History  Problem Relation Age of Onset  . Hypertension Mother   . Hypertension Father     Social History Social History   Tobacco Use  . Smoking status: Current Every Day Smoker    Packs/day: 0.50    Years: 20.00    Pack years: 10.00    Types: Cigarettes  . Smokeless tobacco: Former Neurosurgeon    Types: Snuff  . Tobacco comment: 10/07/2017 "quit years ago"  Vaping Use  . Vaping Use: Never used  Substance Use Topics  . Alcohol use: Not Currently  . Drug use: No     Allergies   Losartan   Review of Systems Review of Systems   Physical Exam Triage Vital Signs ED Triage Vitals  Enc Vitals Group     BP 08/24/19 0824 128/86     Pulse Rate 08/24/19 0824 79     Resp 08/24/19 0824 16     Temp 08/24/19 0824 99 F (37.2 C)     Temp Source 08/24/19 0824 Oral     SpO2 08/24/19 0824 99 %     Weight --      Height --      Head Circumference --      Peak Flow --      Pain Score 08/24/19 0821 4     Pain Loc --      Pain Edu? --      Excl. in GC? --    No data found.  Updated Vital Signs BP 128/86   Pulse 79   Temp 99 F (37.2 C) (Oral)   Resp 16   SpO2 99%   Visual Acuity Right Eye Distance:   Left Eye Distance:   Bilateral Distance:    Right Eye Near:   Left Eye Near:    Bilateral Near:     Physical Exam Vitals and nursing note reviewed.  Constitutional:      General: He is not in acute distress.    Appearance: Normal appearance. He is not ill-appearing,  toxic-appearing or diaphoretic.  HENT:     Right Ear: Tympanic membrane and ear canal normal.     Left Ear: Tympanic membrane and ear canal normal.     Nose: Congestion present.     Mouth/Throat:     Pharynx: Posterior  oropharyngeal erythema present.  Eyes:     Conjunctiva/sclera: Conjunctivae normal.  Cardiovascular:     Rate and Rhythm: Normal rate and regular rhythm.  Pulmonary:     Effort: Pulmonary effort is normal.     Breath sounds: Normal breath sounds.  Musculoskeletal:     Cervical back: Normal range of motion.  Skin:    General: Skin is warm and dry.  Neurological:     Mental Status: He is alert.  Psychiatric:        Mood and Affect: Mood normal.      UC Treatments / Results  Labs (all labs ordered are listed, but only abnormal results are displayed) Labs Reviewed  SARS CORONAVIRUS 2 (TAT 6-24 HRS)    EKG   Radiology No results found.  Procedures Procedures (including critical care time)  Medications Ordered in UC Medications - No data to display  Initial Impression / Assessment and Plan / UC Course  I have reviewed the triage vital signs and the nursing notes.  Pertinent labs & imaging results that were available during my care of the patient were reviewed by me and considered in my medical decision making (see chart for details).     Viral URI with cough Over-the-counter medicines as needed.  Recommended Flonase and Zyrtec. Covid swab pending Final Clinical Impressions(s) / UC Diagnoses   Final diagnoses:  Viral URI with cough     Discharge Instructions     This is most likely some sort of viral upper respiratory infection.  You can do Flonase and Zyrtec for your  symptoms.  Over-the-counter medicines as needed Covid swab pending we will call you if your results are positive    ED Prescriptions    Medication Sig Dispense Auth. Provider   fluticasone (FLONASE) 50 MCG/ACT nasal spray Place 1 spray into both nostrils daily. 16 g Ralene Gasparyan,  Maurissa Ambrose A, NP   cetirizine (ZYRTEC) 10 MG tablet Take 1 tablet (10 mg total) by mouth daily. 30 tablet Dahlia Byes A, NP     PDMP not reviewed this encounter.   Dahlia Byes A, NP 08/24/19 [phone]

## 2019-08-24 NOTE — Discharge Instructions (Addendum)
This is most likely some sort of viral upper respiratory infection.  You can do Flonase and Zyrtec for your  symptoms.  Over-the-counter medicines as needed Covid swab pending we will call you if your results are positive

## 2019-08-25 ENCOUNTER — Encounter: Payer: Self-pay | Admitting: Physical Medicine and Rehabilitation

## 2019-08-25 ENCOUNTER — Ambulatory Visit (INDEPENDENT_AMBULATORY_CARE_PROVIDER_SITE_OTHER): Payer: BC Managed Care – PPO | Admitting: Physical Medicine and Rehabilitation

## 2019-08-25 ENCOUNTER — Ambulatory Visit: Payer: Self-pay

## 2019-08-25 DIAGNOSIS — M25552 Pain in left hip: Secondary | ICD-10-CM

## 2019-08-25 MED ORDER — TRIAMCINOLONE ACETONIDE 40 MG/ML IJ SUSP
60.0000 mg | INTRAMUSCULAR | Status: AC | PRN
  Administered 2019-08-25: 60 mg via INTRA_ARTICULAR

## 2019-08-25 MED ORDER — BUPIVACAINE HCL 0.25 % IJ SOLN
4.0000 mL | INTRAMUSCULAR | Status: AC | PRN
  Administered 2019-08-25: 4 mL via INTRA_ARTICULAR

## 2019-08-25 NOTE — Progress Notes (Signed)
[name] - 44 y.o. male MRN [id number]  Date of birth: 1975/11/07  Office Visit Note: Visit Date: 08/25/2019 PCP: Jarome Matin, MD Referred by: Jarome Matin, MD  Subjective: Chief Complaint  Patient presents with  . Lower Back - Pain  . Left Hip - Pain   HPI:  [name] is a 44 y.o. male who comes in today for planned Left repeat anesthetic hip arthrogram with fluoroscopic guidance.  The patient has failed conservative care including home exercise, medications, time and activity modification.  This injection will be diagnostic and hopefully therapeutic.  Please see requesting physician notes for further details and justification.  Pt states pain in the left side of the lower back and left hip (no groin pain). Pt states last injection 03/31/19 helped out a lot. Sitting up in the bed makes it worse. Moving around helps with pain.    ROS Otherwise per HPI.  Assessment & Plan: Visit Diagnoses:  1. Left hip pain     Plan: No additional findings.   Meds & Orders: No orders of the defined types were placed in this encounter.   Orders Placed This Encounter  Procedures  . Large Joint Inj: L hip joint  . XR C-ARM NO REPORT    Follow-up: Return if symptoms worsen or fail to improve.   Procedures: Large Joint Inj: L hip joint on 08/25/2019 1:28 PM Indications: diagnostic evaluation and pain Details: 22 G 3.5 in needle, fluoroscopy-guided anterior approach  Arthrogram: No  Medications: 4 mL bupivacaine 0.25 %; 60 mg triamcinolone acetonide 40 MG/ML Outcome: tolerated well, no immediate complications  There was excellent flow of contrast producing a partial arthrogram of the hip. The patient did have relief of symptoms during the anesthetic phase of the injection. Procedure, treatment alternatives, risks and benefits explained, specific risks discussed. Consent was given by the patient. Immediately prior to procedure a time out was called to verify the correct patient,  procedure, equipment, support staff and site/side marked as required. Patient was prepped and draped in the usual sterile fashion.      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  Joint spacing looks fairly well maintained. There is some inferior  spurring on the right and there is a small area of ossification of the  superior lateral aspect of the acetabulum. There is no fractures or  dislocations. Sacroiliac joints appear well-maintained. Hip height seem to  be equal but there does appear to be some rotation with the left iliac  crest higher than the right. ----  MRI LUMBAR SPINE WITHOUT CONTRAST   Technique: Multiplanar and multiecho pulse sequences of the lumbar  spine were obtained without intravenous contrast.   Comparison: Plain films 06/17/2007   Findings: The sagittal MR images demonstrate normal overall  alignment of the lumbar vertebral bodies. They demonstrate normal  marrow signal. There is disc desiccation at L2-L3 and at L5-S1.  There are moderate degenerative changes for the patient's age. The  last full intervertebral the space is labeled L5-S1. This  correlates with the plain films. The conus medullaris terminates  at L1.   L1-L2: No significant findings   L2-L3: Broad-based shallow disc protrusion with flattening of the  anterior thecal sac. There is also a shallow right foraminal disc  protrusion with right foraminal encroachment.   L3-L4: Moderate sized central and right  paracentral disc  protrusion with mass effect on the anterior thecal sac. Minimal  medial foraminal encroachment also   L4-L5: Moderate to large central disc protrusion with significant  mass effect on the anterior thecal sac. There is also right  lateral recess stenosis and biforaminal stenosis, right greater  than left.   L5-S1: Moderate  to large central and right paracentral disc  protrusion. There is mass effect on the anterior thecal sac.  There is also a moderate sized broad-based left foraminal disc  protrusion which contacts and compresses the left L5 nerve root.  Mild right foraminal stenosis.   IMPRESSION:   1. Multilevel disc disease with specific findings as discussed  above. There are significant disc protrusions at L3-L4, L4-L5 and  L5-S1.     Objective:  VS:  HT:    WT:   BMI:     BP:   HR: bpm  TEMP: ( )  RESP:  Physical Exam   Imaging: No results found.

## 2019-08-25 NOTE — Progress Notes (Signed)
 .  Numeric Pain Rating Scale and Functional Assessment Average Pain 4   In the last MONTH (on 0-10 scale) has pain interfered with the following?  1. General activity like being  able to carry out your everyday physical activities such as walking, climbing stairs, carrying groceries, or moving a chair?  Rating(7)    -Dye Allergies.

## 2019-09-09 ENCOUNTER — Other Ambulatory Visit (HOSPITAL_COMMUNITY): Payer: Self-pay | Admitting: Internal Medicine

## 2019-09-09 DIAGNOSIS — M25519 Pain in unspecified shoulder: Secondary | ICD-10-CM | POA: Insufficient documentation

## 2019-09-09 DIAGNOSIS — M79604 Pain in right leg: Secondary | ICD-10-CM | POA: Insufficient documentation

## 2019-09-09 DIAGNOSIS — M7989 Other specified soft tissue disorders: Secondary | ICD-10-CM

## 2019-09-10 ENCOUNTER — Ambulatory Visit (HOSPITAL_COMMUNITY): Payer: BC Managed Care – PPO

## 2019-09-13 ENCOUNTER — Other Ambulatory Visit: Payer: Self-pay

## 2019-09-13 ENCOUNTER — Ambulatory Visit (HOSPITAL_COMMUNITY)
Admission: RE | Admit: 2019-09-13 | Discharge: 2019-09-13 | Disposition: A | Discharge status: Home / Self Care | Payer: BC Managed Care – PPO | Source: Ambulatory Visit | Attending: Internal Medicine | Admitting: Internal Medicine

## 2019-09-13 DIAGNOSIS — M7989 Other specified soft tissue disorders: Secondary | ICD-10-CM | POA: Insufficient documentation

## 2019-09-13 NOTE — Progress Notes (Signed)
Lower extremity venous RT study completed.   Unable to reach Jason Harman, MD. Left voicemail for DJ, RN.   See Cv Proc for preliminary results.   [name]

## 2019-09-20 ENCOUNTER — Other Ambulatory Visit: Payer: Self-pay

## 2019-09-20 ENCOUNTER — Ambulatory Visit: Payer: Self-pay

## 2019-09-20 ENCOUNTER — Encounter: Payer: Self-pay | Admitting: Orthopedic Surgery

## 2019-09-20 ENCOUNTER — Ambulatory Visit: Payer: BC Managed Care – PPO | Admitting: Orthopedic Surgery

## 2019-09-20 ENCOUNTER — Telehealth: Payer: Self-pay | Admitting: Orthopedic Surgery

## 2019-09-20 VITALS — Ht 66.0 in | Wt 275.0 lb

## 2019-09-20 DIAGNOSIS — M25512 Pain in left shoulder: Secondary | ICD-10-CM

## 2019-09-20 DIAGNOSIS — G8929 Other chronic pain: Secondary | ICD-10-CM | POA: Diagnosis not present

## 2019-09-20 DIAGNOSIS — M25511 Pain in right shoulder: Secondary | ICD-10-CM | POA: Diagnosis not present

## 2019-09-20 NOTE — Progress Notes (Signed)
Office Visit Note   Patient: [name]           Date of Birth: 1975-05-24           MRN: [id number] Visit Date: 09/20/2019              Requested by: Jarome Matin, MD [address] [place],  [place] [address] PCP: Jarome Matin, MD  Chief Complaint  Patient presents with   Right Shoulder - Pain   Left Shoulder - Pain      HPI: Patient is a 44 year old gentleman who was seen in referral from Dr. Jarold Motto.  Patient is 4 months status post cervical spine fusion from C3-T1 by Dr. Maisie Fus.  Patient states he had neck pain and shoulder pain prior to surgery and he states he still has pain worse in the left shoulder than the right.  He states he cannot reach overhead.  He has weakness in both shoulders with trying to reach overhead.  Assessment & Plan: Visit Diagnoses:  1. Chronic pain of both shoulders     Plan: Patient will need physical therapy for strengthening of his shoulders.  The [facility] joint is asymptomatic bilaterally.  He has no impingement symptoms and no symptoms consistent with rotator cuff pathology.  Follow-Up Instructions: Return in about 4 weeks (around 10/18/2019).   Ortho Exam  Patient is alert, oriented, no adenopathy, well-dressed, normal affect, normal respiratory effort. Examination patient has abduction flexion to 90 degrees on the left 120 degrees on the right.  There is no tenderness to palpation over the [facility] joint bilaterally.  He does have some tenderness to palpation anteriorly to the left shoulder more than the right.  Left shoulder has no pain with Neer or Hawkins impingement test no pain with a drop arm test.  Patient has no rotator cuff symptoms.  Imaging: XR Shoulder Left  Result Date: 09/20/2019 2 view radiographs of the left shoulder shows a congruent glenohumeral joint with AC arthropathy.  XR Shoulder Right  Result Date: 09/20/2019 2 view radiographs of the right shoulder shows a congruent joint there is some AC arthropathy.  No  images are attached to the encounter.  Labs: Lab Results  Component Value Date   HGBA1C 7.5 (H) 05/24/2019   HGBA1C 9.4 (H) 10/07/2017   HGBA1C (H) 07/10/2010    12.7 (NOTE)                                                                       According to the ADA Clinical Practice Recommendations for 2011, when HbA1c is used as a screening test:   >=6.5%   Diagnostic of Diabetes Mellitus           (if abnormal result  is confirmed)  5.7-6.4%   Increased risk of developing Diabetes Mellitus  References:Diagnosis and Classification of Diabetes Mellitus,Diabetes Care,2011,34(Suppl 1):S62-S69 and Standards of Medical Care in         Diabetes - 2011,Diabetes Care,2011,34  (Suppl 1):S11-S61.   REPTSTATUS 09/27/2012 FINAL 09/25/2012   GRAMSTAIN  07/28/2010    NO WBC SEEN NO SQUAMOUS EPITHELIAL CELLS SEEN NO ORGANISMS SEEN   GRAMSTAIN  07/28/2010    RARE WBC PRESENT,BOTH PMN AND MONONUCLEAR NO SQUAMOUS EPITHELIAL CELLS SEEN NO ORGANISMS SEEN  CULT STREPTOCOCCUS,BETA HEMOLYIC NOT GROUP A 09/25/2012     Lab Results  Component Value Date   ALBUMIN 3.6 10/28/2016   ALBUMIN 3.3 (L) 10/11/2015   ALBUMIN 3.6 09/12/2010   PREALBUMIN 11.0 (L) 08/20/2010   PREALBUMIN 13.4 (L) 08/06/2010   PREALBUMIN 7.4 (L) 08/02/2010    Lab Results  Component Value Date   MG 2.0 08/23/2010   MG 1.8 08/20/2010   MG 2.1 08/16/2010   No results found for: VD25OH  Lab Results  Component Value Date   PREALBUMIN 11.0 (L) 08/20/2010   PREALBUMIN 13.4 (L) 08/06/2010   PREALBUMIN 7.4 (L) 08/02/2010   CBC EXTENDED Latest Ref Rng & Units 05/24/2019 10/07/2017 06/22/2017  WBC 4.0 - 10.5 K/uL 7.6 7.6 9.6  RBC 4.22 - 5.81 MIL/uL 4.95 4.81 5.34  HGB 13.0 - 17.0 g/dL 54.2 70.6 23.7  HCT 39 - 52 % 48.2 43.0 48.7  PLT 150 - 400 K/uL 107(L) 98(L) 124(L)  NEUTROABS 1.7 - 7.7 K/uL - 5.2 4.5  LYMPHSABS 0.7 - 4.0 K/uL - 2.0 4.5(H)     Body mass index is 44.39 kg/m.  Orders:  Orders Placed This Encounter    Procedures   XR Shoulder Right   XR Shoulder Left   No orders of the defined types were placed in this encounter.    Procedures: No procedures performed  Clinical Data: No additional findings.  ROS:  All other systems negative, except as noted in the HPI. Review of Systems  Objective: Vital Signs: Ht 5\' 6"  (1.676 m)    Wt 275 lb (124.7 kg)    BMI 44.39 kg/m   Specialty Comments:  No specialty comments available.  PMFS History: Patient Active Problem List   Diagnosis Date Noted   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   Thrombocytopenia, unspecified ([facility]) 07/03/2018   Type 2 diabetes mellitus without complication ([facility]) 07/03/2018   OSA (obstructive sleep apnea) 07/03/2018   Excessive daytime sleepiness 07/03/2018   Class 3 severe obesity due to excess calories with serious comorbidity and body mass index (BMI) of 40.0 to 44.9 in adult [facility]) 07/03/2018   Non-seasonal allergic rhinitis due to pollen 07/03/2018   TIA (transient ischemic attack) 10/07/2017   Past Medical History:  Diagnosis Date   Acute pancreatitis 07/2010   Anxiety    Arthritis    "lower back and shoulders" (10/07/2017)   Chronic lower back pain    Dyslipidemia    Headache    "weekly" (10/07/2017)   Hypercholesteremia    Hypertension    MRSA (methicillin resistant staph aureus) culture positive    Obesity    Obesity    OSA on CPAP    Peripheral neuropathy    Scoliosis    Sleep apnea    "can't tolerate any of the masks" (10/07/2017)   Stroke ([facility])    stroke-like 2.5 years ago   TIA (transient ischemic attack)    Type II diabetes mellitus ([facility])    Umbilical hernia     Family History  Problem Relation Age of Onset   Hypertension Mother    Hypertension Father     Past Surgical History:  Procedure Laterality Date   HIP PINNING Bilateral    "they are worn out" (10/07/2017)   PICC LINE INSERTION  07/2010   "in hospital for ~ 2 months; took  PICC out when I was discharged"   POSTERIOR CERVICAL FUSION/FORAMINOTOMY N/A 05/26/2019   Procedure: POSTERIOR CERVICAL FUSION WITH LATERAL MASS FIXATION CERVICAL THREE- THORACIC  ONE;  Surgeon: Bedelia Person, MD;  [facility]: [facility];  Service: Neurosurgery;  Laterality: N/A;  posterior, Cervical/Thoracic   TESTICLE REMOVAL Right ~ 2012   "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

## 2019-09-21 ENCOUNTER — Telehealth: Payer: Self-pay | Admitting: Orthopedic Surgery

## 2019-09-21 ENCOUNTER — Other Ambulatory Visit: Payer: Self-pay

## 2019-09-21 NOTE — Telephone Encounter (Signed)
Made in error

## 2019-09-21 NOTE — Telephone Encounter (Signed)
Note written and faxed to number provided. Can call with questions.

## 2019-09-21 NOTE — Telephone Encounter (Signed)
Pt had an appt on 09/20/19 and would like a work note to be faxed over.   Fax# [phone]

## 2019-10-05 ENCOUNTER — Telehealth: Payer: Self-pay | Admitting: Physical Medicine and Rehabilitation

## 2019-10-05 NOTE — Telephone Encounter (Signed)
Lease advise.

## 2019-10-05 NOTE — Telephone Encounter (Signed)
Patient called requesting an appt for right hip injection. Patient states last injection did not touch the pain. Please contact patient for appt at [phone].

## 2019-10-06 ENCOUNTER — Telehealth: Payer: Self-pay | Admitting: Physical Medicine and Rehabilitation

## 2019-10-06 NOTE — Telephone Encounter (Signed)
Can try one more and see if better flow of contrast but may need to regroup with Orthopedic surgeon. I think he came to [place] from PCP but we can see if he has seen  one of our guys

## 2019-10-06 NOTE — Telephone Encounter (Signed)
Patient returned call asked for a call back. The number to contact patient is [phone]

## 2019-10-06 NOTE — Telephone Encounter (Signed)
Note: Patient said to call him back at 2:30pm. That is when he can answer the phone  [phone]   See previous message

## 2019-10-06 NOTE — Telephone Encounter (Signed)
See 8/3 message. °

## 2019-10-06 NOTE — Telephone Encounter (Signed)
See 8/3 message.

## 2019-10-06 NOTE — Telephone Encounter (Signed)
Left message #1

## 2019-10-06 NOTE — Telephone Encounter (Signed)
Scheduled for 8/9

## 2019-10-11 ENCOUNTER — Ambulatory Visit (INDEPENDENT_AMBULATORY_CARE_PROVIDER_SITE_OTHER): Payer: BC Managed Care – PPO | Admitting: Physical Medicine and Rehabilitation

## 2019-10-11 ENCOUNTER — Ambulatory Visit: Payer: Self-pay

## 2019-10-11 ENCOUNTER — Encounter: Payer: Self-pay | Admitting: Physical Medicine and Rehabilitation

## 2019-10-11 VITALS — BP 143/87 | HR 71

## 2019-10-11 DIAGNOSIS — M25552 Pain in left hip: Secondary | ICD-10-CM

## 2019-10-11 NOTE — Progress Notes (Signed)
Pt states lower back pain. Pt state walking and sitting in bed makes pain worse. Pt states he trys to rest to ease pain.  Numeric Pain Rating Scale and Functional Assessment Average Pain 10   In the last MONTH (on 0-10 scale) has pain interfered with the following?  1. General activity like being  able to carry out your everyday physical activities such as walking, climbing stairs, carrying groceries, or moving a chair?  Rating(3)   +Driver, -BT, -Dye Allergies.

## 2019-10-15 ENCOUNTER — Other Ambulatory Visit: Payer: Self-pay

## 2019-10-15 ENCOUNTER — Ambulatory Visit (INDEPENDENT_AMBULATORY_CARE_PROVIDER_SITE_OTHER): Payer: BC Managed Care – PPO | Admitting: Rehabilitative and Restorative Service Providers"

## 2019-10-15 ENCOUNTER — Encounter: Payer: Self-pay | Admitting: Rehabilitative and Restorative Service Providers"

## 2019-10-15 DIAGNOSIS — M6281 Muscle weakness (generalized): Secondary | ICD-10-CM

## 2019-10-15 DIAGNOSIS — R293 Abnormal posture: Secondary | ICD-10-CM

## 2019-10-15 DIAGNOSIS — M25512 Pain in left shoulder: Secondary | ICD-10-CM

## 2019-10-15 DIAGNOSIS — G8929 Other chronic pain: Secondary | ICD-10-CM | POA: Diagnosis not present

## 2019-10-15 NOTE — Patient Instructions (Signed)
Access Code: [email or web address] URL: [email or web address] Date: 10/15/2019 Prepared by: Pauletta Browns  Exercises Standing Scapular Retraction - 5 x daily - 7 x weekly - 1 sets - 5 reps - 5 hold Supine Scapular Protraction in Flexion with Dumbbells - 1 x daily - 7 x weekly - 2 sets - 10-20 reps - 3 seconds hold Shoulder External Rotation with Anchored Resistance with Towel Under Elbow - 1 x daily - 7 x weekly - 2-3 sets - 10 reps - 3 hold Standing Shoulder Internal Rotation with Anchored Resistance - 1 x daily - 7 x weekly - 2-3 sets - 10 reps - 3 seconds hold

## 2019-10-15 NOTE — Therapy (Signed)
[facility] Physical Therapy [address] [place], [place], [address] Phone: [phone]   Fax:  [phone]  Physical Therapy Evaluation  Patient Details  Name: [name] MRN: [id number] Date of Birth: 1975-05-12 Referring Provider (PT): Aldean Baker MD   Encounter Date: 10/15/2019   PT End of Session - 10/15/19 1612    Visit Number 1    Number of Visits 8    PT Start Time 1508    PT Stop Time 1555    PT Time Calculation (min) 47 min    Activity Tolerance Patient tolerated treatment well;No increased pain;Patient limited by fatigue    Behavior During Therapy [facility] for tasks assessed/performed           Past Medical History:  Diagnosis Date  . Acute pancreatitis 07/2010  . Anxiety   . Arthritis    "lower back and shoulders" (10/07/2017)  . Chronic lower back pain   . Dyslipidemia   . Headache    "weekly" (10/07/2017)  . Hypercholesteremia   . Hypertension   . MRSA (methicillin resistant staph aureus) culture positive   . Obesity   . Obesity   . OSA on CPAP   . Peripheral neuropathy   . Scoliosis   . Sleep apnea    "can't tolerate any of the masks" (10/07/2017)  . Stroke [facility])    stroke-like 2.5 years ago  . TIA (transient ischemic attack)   . Type II diabetes mellitus (HCC)   . Umbilical hernia     Past Surgical History:  Procedure Laterality Date  . HIP PINNING Bilateral    "they are worn out" (10/07/2017)  . PICC LINE INSERTION  07/2010   "in hospital for ~ 2 months; took PICC out when I was discharged"  . POSTERIOR CERVICAL FUSION/FORAMINOTOMY N/A 05/26/2019   Procedure: POSTERIOR CERVICAL FUSION WITH LATERAL MASS FIXATION CERVICAL THREE- THORACIC ONE;  Surgeon: Bedelia Person, MD;  Location: [facility] OR;  Service: Neurosurgery;  Laterality: N/A;  posterior, Cervical/Thoracic  . 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    Subjective  Assessment - 10/15/19 1605    Subjective [name] has L shoulder weakness and pain after a cervical fusion.  His shoulders have always been weak but his L side has been noticeably weaker since surgery.    Limitations Lifting    Patient Stated Goals Strengthen shoulders for reaching and lifting.    Currently in Pain? 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  1    Has the patient had a decrease in activity level because of a fear of falling?  No    Is the patient reluctant to leave their home because of a fear of falling?  No      Prior Function   Level of Independence Independent      Cognition   Overall Cognitive Status Within Functional Limits for tasks assessed      Posture/Postural Control   Posture/Postural Control Postural limitations    Postural Limitations Forward head;Rounded Shoulders;Decreased lumbar lordosis      ROM / Strength   AROM / PROM / Strength AROM;Strength      AROM   Overall AROM  Deficits    AROM Assessment Site Shoulder    Right/Left Shoulder Left;Right    Right Shoulder Flexion 160 Degrees    Right Shoulder Internal Rotation 65 Degrees    Right Shoulder External Rotation 80 Degrees    Right Shoulder Horizontal  ADduction 20 Degrees    Left Shoulder Flexion 160 Degrees    Left Shoulder Internal Rotation 70  Degrees    Left Shoulder External Rotation 90 Degrees    Left Shoulder Horizontal ADduction 25 Degrees      Strength   Overall Strength Deficits    Strength Assessment Site Shoulder    Right/Left Shoulder Left;Right                      Objective measurements completed on examination: See above findings.       [facility] Adult PT Treatment/Exercise - 10/15/19 0001      Exercises   Exercises Shoulder      Shoulder Exercises: Supine   Protraction Strengthening;Both;20 reps   5# 2 sets     Shoulder Exercises: Standing   External Rotation Strengthening;Left;10 reps;Theraband   2 sets   Theraband Level (Shoulder External Rotation) Level 2 (Red)    Internal Rotation Strengthening;Left;10 reps;Theraband   2 sets   Theraband Level (Shoulder Internal Rotation) Level 2 (Red)    Other Standing Exercises Shoulder blade pinches 10X 5 seconds                  PT Education - 10/15/19 1611    Education Details Discussed anatomy of the cervical spine and shoulder.  Discussed exam findings and recommended plan of care.  Estimated 6-8 weeks of PT on a 1X/week basis with consistent HEP compliance.  Prescribed appropriate HEP to address impairments noted today.    Person(s) Educated Patient    Methods Explanation;Demonstration;Verbal cues;Handout    Comprehension Verbalized understanding;Returned demonstration;Need further instruction;Verbal cues required               PT Long Term Goals - 10/15/19 1618      PT LONG TERM GOAL #1   Title [name] will report L shoulder pain consistently 0-2/10 on the Numeric Pain Rating Scale.    Time 8    Period Weeks    Status New    Target Date 12/10/19      PT LONG TERM GOAL #2   Title [name] will report the ability to reach and function overhead at work and home with at least 90% function.    Baseline < 50%    Time 8    Period Weeks    Status New    Target Date 12/10/19      PT LONG TERM GOAL #3   Title Improve L shoulder  strength to 5/5 MMT.  Time 8    Period Weeks    Status New    Target Date 12/10/19      PT LONG TERM GOAL #4   Title [name] will be independent with his long-term DC HEP.    Time 8    Period Weeks    Status New    Target Date 12/10/19                  Plan - 10/15/19 1613    Clinical Impression Statement [name] has some B shoulder weakness with L > R after a cervical fusion.  Scapular and rotator cuff strengthening will be the emphasis although some cervical stabilization work will also help.  [name]'s prognosis is good with the recommended course of treatment.    Personal Factors and Comorbidities Comorbidity 1    Comorbidities Cervical fusion, diabetes    Examination-Activity Limitations Lift;Carry;Reach Overhead    Examination-Participation Restrictions Occupation;Driving    Clinical Decision Making Low    Rehab Potential Good    PT Frequency 1x / week    PT Duration 8 weeks    PT Treatment/Interventions ADLs/Self Care Home Management;Cryotherapy;Therapeutic exercise;Therapeutic activities;Neuromuscular re-education;Patient/family education;Manual techniques    PT Next Visit Plan Progress scapular, RTC and postural strength.    PT Home Exercise Plan [id number]    Consulted and Agree with Plan of Care Patient           Patient will benefit from skilled therapeutic intervention in order to improve the following deficits and impairments:  Decreased activity tolerance, Decreased endurance, Decreased range of motion, Decreased strength, Impaired UE functional use, Postural dysfunction, Pain  Visit Diagnosis: Abnormal posture  Chronic left shoulder pain  Muscle weakness (generalized)     Problem List Patient Active Problem List   Diagnosis Date Noted  . 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  . Thrombocytopenia, unspecified (HCC) 07/03/2018  . Type 2 diabetes mellitus without complication (HCC) 07/03/2018  . OSA (obstructive sleep apnea)  07/03/2018  . Excessive daytime sleepiness 07/03/2018  . Class 3 severe obesity due to excess calories with serious comorbidity and body mass index (BMI) of 40.0 to 44.9 in adult (HCC) 07/03/2018  . Non-seasonal allergic rhinitis due to pollen 07/03/2018  . TIA (transient ischemic attack) 10/07/2017    Cherlyn Cushing PT, MPT 10/15/2019, 4:22 PM  [facility] Physical Therapy [address] [place], [place], [address] Phone: [phone]   Fax:  [phone]  Name: [name] MRN: [id number] Date of Birth: 06-26-1975

## 2019-10-20 ENCOUNTER — Institutional Professional Consult (permissible substitution): Payer: BC Managed Care – PPO | Admitting: Orthopaedic Surgery

## 2019-10-21 ENCOUNTER — Ambulatory Visit: Payer: BC Managed Care – PPO | Admitting: Orthopedic Surgery

## 2019-10-26 ENCOUNTER — Encounter: Payer: Self-pay | Admitting: Adult Health

## 2019-10-26 MED ORDER — BUPIVACAINE HCL 0.25 % IJ SOLN
4.0000 mL | INTRAMUSCULAR | Status: AC | PRN
  Administered 2019-10-11: 4 mL via INTRA_ARTICULAR

## 2019-10-26 MED ORDER — TRIAMCINOLONE ACETONIDE 40 MG/ML IJ SUSP
60.0000 mg | INTRAMUSCULAR | Status: AC | PRN
  Administered 2019-10-11: 60 mg via INTRA_ARTICULAR

## 2019-10-26 NOTE — Progress Notes (Signed)
[name] - 44 y.o. male MRN [id number]  Date of birth: 03/31/1975  Office Visit Note: Visit Date: 10/11/2019 PCP: Jarome Matin, MD Referred by: Jarome Matin, MD  Subjective: Chief Complaint  Patient presents with  . Lower Back - Pain   HPI: [name] is a 44 y.o. male who comes in today For brief evaluation and management and possible repeat left intra-articular hip injection.  Prior injection in June gave him temporary relief but his symptoms returned pretty quickly.  His notes can be reviewed we have seen the patient over the last many years initially at the request of Dr. Dossie Arbour his primary care physician.  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  This was completed by Dr. Ranee Gosselin.  He also has a history of remote lumbar disc herniation.  His case is complicated by obesity and diabetes as well as chronic anticoagulation.  He has had a history of cervical spine issues as well and this been more recent and really more of an issue than his lower back.  His left hip pain is really what is debilitating for him.  He is on chronic pain medication through his primary care physician.  Today his pain is primarily in the left hip and groin worse with movement and standing going from sit to stand.  He rates his pain as a 10 out of 10.  He has had no falls or trauma.  He really is not getting pain down past the knee and no paresthesias.  Today I am going to repeat the injection diagnostically and hopefully this will give him some longer term relief but even if it short-term and he feels much better I think the neck step is to see Dr. Doneen Poisson for evaluation.  He has seen Dr. Aldean Baker in the past for his shoulders.  Those notes were reviewed today.  He has been in physical therapy.  ROS Otherwise per HPI.  Assessment & Plan: Visit Diagnoses:  1. Pain in left hip     Plan: No  additional findings.   Meds & Orders: No orders of the defined types were placed in this encounter.   Orders Placed This Encounter  Procedures  . Large Joint Inj  . XR C-ARM NO REPORT    Follow-up: No follow-ups on file.   Procedures: Left intra-articular hip injection fluoroscopic guidance on 10/11/2019 2:00 PM Indications: diagnostic evaluation and pain Details: 22 G 3.5 in needle, fluoroscopy-guided anterior approach  Arthrogram: No  Medications: 4 mL bupivacaine 0.25 %; 60 mg triamcinolone acetonide 40 MG/ML Outcome: tolerated well, no immediate complications  There was excellent flow of contrast producing a partial arthrogram of the hip. The patient did have relief of symptoms during the anesthetic phase of the injection. Procedure, treatment alternatives, risks and benefits explained, specific risks discussed. Consent was given by the patient. Immediately prior to procedure a time out was called to verify the correct patient, procedure, equipment, support staff and site/side marked as required. Patient was prepped and draped in the usual sterile fashion.      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  Joint spacing looks fairly well maintained. There is some inferior  spurring on the right and there is a small area of ossification of the  superior lateral aspect of the acetabulum. There is no fractures or  dislocations. Sacroiliac joints appear well-maintained. Hip height seem to  be equal but there does appear to be some rotation with the left iliac  crest higher than the right. ----  MRI LUMBAR SPINE WITHOUT CONTRAST   Technique: Multiplanar and multiecho pulse sequences of the lumbar  spine were obtained without intravenous contrast.   Comparison: Plain films 06/17/2007   Findings: The sagittal MR images demonstrate  normal overall  alignment of the lumbar vertebral bodies. They demonstrate normal  marrow signal. There is disc desiccation at L2-L3 and at L5-S1.  There are moderate degenerative changes for the patient's age. The  last full intervertebral the space is labeled L5-S1. This  correlates with the plain films. The conus medullaris terminates  at L1.   L1-L2: No significant findings   L2-L3: Broad-based shallow disc protrusion with flattening of the  anterior thecal sac. There is also a shallow right foraminal disc  protrusion with right foraminal encroachment.   L3-L4: Moderate sized central and right paracentral disc  protrusion with mass effect on the anterior thecal sac. Minimal  medial foraminal encroachment also   L4-L5: Moderate to large central disc protrusion with significant  mass effect on the anterior thecal sac. There is also right  lateral recess stenosis and biforaminal stenosis, right greater  than left.   L5-S1: Moderate to large central and right paracentral disc  protrusion. There is mass effect on the anterior thecal sac.  There is also a moderate sized broad-based left foraminal disc  protrusion which contacts and compresses the left L5 nerve root.  Mild right foraminal stenosis.   IMPRESSION:   1. Multilevel disc disease with specific findings as discussed  above. There are significant disc protrusions at L3-L4, L4-L5 and  L5-S1.   He reports that he has been smoking cigarettes. He has a 10.00 pack-year smoking history. He has quit using smokeless tobacco.  His smokeless tobacco use included snuff.  Recent Labs    05/24/19 0842  HGBA1C 7.5*    Objective:  VS:  HT:    WT:   BMI:     BP:(!) 143/87  HR:71bpm  TEMP: ( )  RESP:  Physical Exam Constitutional:      General: He is not in acute distress.    Appearance: Normal appearance. He is obese. He is not ill-appearing.  HENT:     Head: Normocephalic and atraumatic.     Right Ear: External ear  normal.     Left Ear: External ear normal.  Eyes:     Extraocular Movements: Extraocular movements intact.  Cardiovascular:     Rate and Rhythm: Normal rate.     Pulses: Normal pulses.  Abdominal:     General: There is no distension.     Palpations: Abdomen is soft.  Musculoskeletal:        General: No tenderness or signs of injury.     Right lower leg: No edema.     Left lower leg: No edema.     Comments: Patient has good distal strength without clonus.  He has concordant hip and groin pain with left internal hip rotation.  Skin:    Findings: No erythema or rash.  Neurological:     General: No focal deficit present.     Mental Status: He is alert and oriented to person, place, and time.     Sensory: No sensory deficit.     Motor: No weakness or  abnormal muscle tone.     Coordination: Coordination normal.  Psychiatric:        Mood and Affect: Mood normal.        Behavior: Behavior normal.     Ortho Exam  Imaging: No results found.  Past Medical/Family/Surgical/Social History: Medications & Allergies reviewed per EMR, new medications updated. Patient Active Problem List   Diagnosis Date Noted  . 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  . Thrombocytopenia, unspecified (HCC) 07/03/2018  . Type 2 diabetes mellitus without complication (HCC) 07/03/2018  . OSA (obstructive sleep apnea) 07/03/2018  . Excessive daytime sleepiness 07/03/2018  . Class 3 severe obesity due to excess calories with serious comorbidity and body mass index (BMI) of 40.0 to 44.9 in adult (HCC) 07/03/2018  . Non-seasonal allergic rhinitis due to pollen 07/03/2018  . TIA (transient ischemic attack) 10/07/2017   Past Medical History:  Diagnosis Date  . Acute pancreatitis 07/2010  . Anxiety   . Arthritis    "lower back and shoulders" (10/07/2017)  . Chronic lower back pain   . Dyslipidemia   . Headache    "weekly" (10/07/2017)  . Hypercholesteremia   . Hypertension   . MRSA  (methicillin resistant staph aureus) culture positive   . Obesity   . Obesity   . OSA on CPAP   . Peripheral neuropathy   . Scoliosis   . Sleep apnea    "can't tolerate any of the masks" (10/07/2017)  . Stroke [facility])    stroke-like 2.5 years ago  . TIA (transient ischemic attack)   . Type II diabetes mellitus (HCC)   . Umbilical hernia    Family History  Problem Relation Age of Onset  . Hypertension Mother   . Hypertension Father    Past Surgical History:  Procedure Laterality Date  . HIP PINNING Bilateral    "they are worn out" (10/07/2017)  . PICC LINE INSERTION  07/2010   "in hospital for ~ 2 months; took PICC out when I was discharged"  . POSTERIOR CERVICAL FUSION/FORAMINOTOMY N/A 05/26/2019   Procedure: POSTERIOR CERVICAL FUSION WITH LATERAL MASS FIXATION CERVICAL THREE- THORACIC ONE;  Surgeon: Bedelia Person, MD;  Location: [facility] OR;  Service: Neurosurgery;  Laterality: N/A;  posterior, Cervical/Thoracic  . 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 Not on file  Tobacco Use  . Smoking status: Current Every Day Smoker    Packs/day: 0.50    Years: 20.00    Pack years: 10.00    Types: Cigarettes  . Smokeless tobacco: Former Neurosurgeon    Types: Snuff  . Tobacco comment: 10/07/2017 "quit years ago"  Vaping Use  . Vaping Use: Never used  Substance and Sexual Activity  . Alcohol use: Not Currently  . Drug use: No  . Sexual activity: Yes

## 2019-10-28 ENCOUNTER — Ambulatory Visit: Payer: BC Managed Care – PPO | Admitting: Adult Health

## 2019-10-28 ENCOUNTER — Ambulatory Visit: Payer: Self-pay

## 2019-10-28 ENCOUNTER — Ambulatory Visit: Payer: BC Managed Care – PPO | Admitting: Orthopaedic Surgery

## 2019-10-28 ENCOUNTER — Encounter: Payer: Self-pay | Admitting: Adult Health

## 2019-10-28 VITALS — BP 158/89 | HR 78 | Ht 66.0 in | Wt 272.0 lb

## 2019-10-28 VITALS — Ht 66.0 in | Wt 272.0 lb

## 2019-10-28 DIAGNOSIS — M1612 Unilateral primary osteoarthritis, left hip: Secondary | ICD-10-CM

## 2019-10-28 DIAGNOSIS — M545 Low back pain: Secondary | ICD-10-CM | POA: Diagnosis not present

## 2019-10-28 DIAGNOSIS — Z9989 Dependence on other enabling machines and devices: Secondary | ICD-10-CM

## 2019-10-28 DIAGNOSIS — M1611 Unilateral primary osteoarthritis, right hip: Secondary | ICD-10-CM | POA: Diagnosis not present

## 2019-10-28 DIAGNOSIS — Z969 Presence of functional implant, unspecified: Secondary | ICD-10-CM | POA: Insufficient documentation

## 2019-10-28 DIAGNOSIS — G8929 Other chronic pain: Secondary | ICD-10-CM

## 2019-10-28 DIAGNOSIS — M25552 Pain in left hip: Secondary | ICD-10-CM | POA: Diagnosis not present

## 2019-10-28 DIAGNOSIS — G4733 Obstructive sleep apnea (adult) (pediatric): Secondary | ICD-10-CM

## 2019-10-28 NOTE — Progress Notes (Signed)
PATIENT: [name] DOB: Feb 16, 1976  REASON FOR VISIT: follow up HISTORY FROM: patient  HISTORY OF PRESENT ILLNESS: Today 10/28/19:  [name] is a 44 year old male with a history of obstructive sleep apnea on CPAP.  His download indicates that he uses machine nightly for compliance of 100%.  He uses machine greater than 4 hours 28 days for compliance of 93%.  On average he uses his machine 6 hours and 2 minutes.  His residual AHI is 1.5 on 7 to 18 cm of water with EPR 3.  Leak in the 95th percentile is 4.1.  Reports that the CPAP is working well for him.  He returns today for follow-up.  HISTORY 10/22/18:  Mr.[name] is a 44 year old male with a history of obstructive sleep apnea on CPAP.  He returns today for follow-up.  His download indicates that he uses his machine nightly for compliance of 100%.  He uses his machine greater than 4 hours 28 days for compliance of 93%.  On average he uses his machine 6 hours and 9 minutes.  His residual AHI is 1.3 on 7 to 18 cm of water with EPR 3.  His leak in the 95th percentile is 6.8.  Overall he is doing well.  He denies any new issues with his machine.   REVIEW OF SYSTEMS: Out of a complete 14 system review of symptoms, the patient complains only of the following symptoms, and all other reviewed systems are negative.  FSS 27 ESS 6  ALLERGIES: Allergies  Allergen Reactions  . Losartan Swelling and Rash    Patient says 'he's not 100% sure if it was losartan, but he thinks it is." He knows it was a blood pressure medicine.    HOME MEDICATIONS: Outpatient Medications Prior to Visit  Medication Sig Dispense Refill  . amLODipine (NORVASC) 10 MG tablet Take 1 tablet (10 mg total) by mouth daily. 90 tablet 0  . Dapagliflozin Propanediol (FARXIGA PO) Take by mouth.    . escitalopram (LEXAPRO) 20 MG tablet Take 20 mg by mouth daily.    Marland Kitchen gabapentin (NEURONTIN) 300 MG capsule Take 300 mg by mouth 2 (two) times daily.    Marland Kitchen HUMALOG KWIKPEN 100  UNIT/ML KwikPen Inject 10-15 Units into the skin 3 (three) times daily before meals.     . Insulin Glargine (TOUJEO SOLOSTAR Aquilla) Inject 54 Units into the skin 2 (two) times daily.     . metFORMIN (GLUCOPHAGE-XR) 500 MG 24 hr tablet Take 2 tablets (1,000 mg total) by mouth 2 (two) times daily with a meal. 120 tablet 2  . propranolol (INDERAL) 10 MG tablet Take 10 mg by mouth 3 (three) times daily.    . cetirizine (ZYRTEC) 10 MG tablet Take 1 tablet (10 mg total) by mouth daily. 30 tablet 0  . fluticasone (FLONASE) 50 MCG/ACT nasal spray Place 1 spray into both nostrils daily. 16 g 2  . methocarbamol (ROBAXIN) 500 MG tablet Take 1 tablet (500 mg total) by mouth every 8 (eight) hours as needed for muscle spasms. 50 tablet 1   No facility-administered medications prior to visit.    PAST MEDICAL HISTORY: Past Medical History:  Diagnosis Date  . Acute pancreatitis 07/2010  . Anxiety   . Arthritis    "lower back and shoulders" (10/07/2017)  . Chronic lower back pain   . Dyslipidemia   . Headache    "weekly" (10/07/2017)  . Hypercholesteremia   . Hypertension   . MRSA (methicillin resistant staph aureus) culture  positive   . Obesity   . Obesity   . OSA on CPAP   . Peripheral neuropathy   . Scoliosis   . Sleep apnea    "can't tolerate any of the masks" (10/07/2017)  . Stroke [facility])    stroke-like 2.5 years ago  . TIA (transient ischemic attack)   . Type II diabetes mellitus (HCC)   . Umbilical hernia     PAST SURGICAL HISTORY: Past Surgical History:  Procedure Laterality Date  . HIP PINNING Bilateral    "they are worn out" (10/07/2017)  . PICC LINE INSERTION  07/2010   "in hospital for ~ 2 months; took PICC out when I was discharged"  . POSTERIOR CERVICAL FUSION/FORAMINOTOMY N/A 05/26/2019   Procedure: POSTERIOR CERVICAL FUSION WITH LATERAL MASS FIXATION CERVICAL THREE- THORACIC ONE;  Surgeon: Bedelia Person, MD;  Location: [facility] OR;  Service: Neurosurgery;  Laterality: N/A;  posterior,  Cervical/Thoracic  . 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 Hypertension Mother   . Hypertension Father     SOCIAL HISTORY: Social History   Socioeconomic History  . Marital status: Legally Separated    Spouse name: Not on file  . Number of children: Not on file  . Years of education: Not on file  . Highest education level: Not on file  Occupational History  . Not on file  Tobacco Use  . Smoking status: Current Every Day Smoker    Packs/day: 0.50    Years: 20.00    Pack years: 10.00    Types: Cigarettes  . Smokeless tobacco: Former Neurosurgeon    Types: Snuff  . Tobacco comment: 10/07/2017 "quit years ago"  Vaping Use  . Vaping Use: Never used  Substance and Sexual Activity  . Alcohol use: Not Currently  . Drug use: No  . Sexual activity: Yes  Other Topics Concern  . Not on file  Social History Narrative  . Not on file   Social Determinants of Health   Financial Resource Strain:   . Difficulty of Paying Living Expenses: Not on file  Food Insecurity:   . Worried About Programme researcher, broadcasting/film/video in the Last Year: Not on file  . Ran Out of Food in the Last Year: Not on file  Transportation Needs:   . Lack of Transportation (Medical): Not on file  . Lack of Transportation (Non-Medical): Not on file  Physical Activity:   . Days of Exercise per Week: Not on file  . Minutes of Exercise per Session: Not on file  Stress:   . Feeling of Stress : Not on file  Social Connections:   . Frequency of Communication with Friends and Family: Not on file  . Frequency of Social Gatherings with Friends and Family: Not on file  . Attends Religious Services: Not on file  . Active Member of Clubs or Organizations: Not on file  . Attends Banker Meetings: Not on file  . Marital Status: Not on file  Intimate Partner Violence:   . Fear of Current or Ex-Partner:  Not on file  . Emotionally Abused: Not on file  . Physically Abused: Not on file  . Sexually Abused: Not on file      PHYSICAL EXAM  Vitals:   10/28/19 1314  BP: (!) 158/89  Pulse: 78  Weight: 272 lb (123.4 kg)  Height: 5\' 6"  (1.676  m)   Body mass index is 43.9 kg/m.  Generalized: Well developed, in no acute distress  Chest: Lungs clear to auscultation bilaterally  Neurological examination  Mentation: Alert oriented to time, place, history taking. Follows all commands speech and language fluent Cranial nerve II-XII: Extraocular movements were full, visual field were full on confrontational test Head turning and shoulder shrug  were normal and symmetric. Motor: The motor testing reveals 5 over 5 strength of all 4 extremities. Good symmetric motor tone is noted throughout.  Sensory: Sensory testing is intact to soft touch on all 4 extremities. No evidence of extinction is noted.  Gait and station: Gait is normal.    DIAGNOSTIC DATA (LABS, IMAGING, TESTING) - I reviewed patient records, labs, notes, testing and imaging myself where available.  Lab Results  Component Value Date   WBC 7.6 05/24/2019   HGB 15.6 05/24/2019   HCT 48.2 05/24/2019   MCV 97.4 05/24/2019   PLT 107 (L) 05/24/2019      Component Value Date/Time   NA 140 05/24/2019 0842   K 4.2 05/24/2019 0842   CL 106 05/24/2019 0842   CO2 27 05/24/2019 0842   GLUCOSE 120 (H) 05/24/2019 0842   BUN 17 05/24/2019 0842   CREATININE 0.73 05/24/2019 0842   CALCIUM 8.2 (L) 05/24/2019 0842   PROT 6.5 10/28/2016 0305   ALBUMIN 3.6 10/28/2016 0305   AST 17 10/28/2016 0305   ALT 14 (L) 10/28/2016 0305   ALKPHOS 73 10/28/2016 0305   BILITOT 0.7 10/28/2016 0305   GFRNONAA >60 05/24/2019 0842   GFRAA >60 05/24/2019 0842   Lab Results  Component Value Date   CHOL 254 (H) 10/07/2017   HDL 36 (L) 10/07/2017   LDLCALC UNABLE TO CALCULATE IF TRIGLYCERIDE OVER 400 mg/dL 61/95/0932   LDLDIRECT 81 10/07/2017   TRIG  775 (H) 10/07/2017   CHOLHDL 7.1 10/07/2017   Lab Results  Component Value Date   HGBA1C 7.5 (H) 05/24/2019   No results found for: VITAMINB12 Lab Results  Component Value Date   TSH 1.114 07/08/2010      ASSESSMENT AND PLAN 44 y.o. year old male  has a past medical history of Acute pancreatitis (07/2010), Anxiety, Arthritis, Chronic lower back pain, Dyslipidemia, Headache, Hypercholesteremia, Hypertension, MRSA (methicillin resistant staph aureus) culture positive, Obesity, Obesity, OSA on CPAP, Peripheral neuropathy, Scoliosis, Sleep apnea, Stroke (HCC), TIA (transient ischemic attack), Type II diabetes mellitus (HCC), and Umbilical hernia. here with:  1. OSA on CPAP  - CPAP compliance excellent - Good treatment of AHI  - Encourage patient to use CPAP nightly and > 4 hours each night - F/U in 1 year or sooner if needed   I spent 25 minutes of face-to-face and non-face-to-face time with patient.  This included previsit chart review, lab review, study review, order entry, electronic health record documentation, patient education.  Butch Penny, MSN, NP-C 10/28/2019, 1:32 PM [facility] [address] [place], [place] [address] [phone]

## 2019-10-28 NOTE — Progress Notes (Signed)
Office Visit Note   Patient: [name]           Date of Birth: 05/12/1975           MRN: [id number] Visit Date: 10/28/2019              Requested by: Jarome Matin, MD [address] [place],  [place] [address] PCP: Jarome Matin, MD   Assessment & Plan: Visit Diagnoses:  1. Left hip pain   2. Chronic bilateral low back pain, unspecified whether sciatica present   3. Unilateral primary osteoarthritis, right hip   4. Unilateral primary osteoarthritis, left hip   5. Retained orthopedic hardware     Plan: At this point, he is in need of hip replacement surgery.  I counseled him about this extensively.  He is going to continue to work on weight loss but I do feel it is essential we proceed with the surgery when it is safe to do so from the COVID-19 pandemic standpoint.  We did draw hemoglobin A1c in the office today and will inform him of those results.  I did explain in detail what this type of surgery involves.  We would start with the left hip first and replaced the right hip at another period of time.  He understands this is quite difficult given the retained screws that have been in his hips for 30 years.  I showed him a hip replacement model and explained in detail the interoperative postoperative course.  He understands the risk of surgery are heightened given his obesity and diabetes.  If his hemoglobin A1c is in an acceptable level, we can work on getting him scheduled for surgery once we are allowed to perform surgery where the patient stays overnight.  I would not feel comfortable sending him on the same day.  All questions and concerns were answered and addressed.  Follow-Up Instructions: Return for 2 weeks post-op.   Orders:  Orders Placed This Encounter  Procedures  . XR HIP UNILAT W OR W/O PELVIS 1V LEFT  . XR Lumbar Spine 2-3 Views  . HgB A1c   No orders of the defined types were placed in this encounter.     Procedures: No procedures performed   Clinical  Data: No additional findings.   Subjective: Chief Complaint  Patient presents with  . Left Hip - Pain  The patient is a very pleasant 44 year old gentleman that I am seeing for the first time but he is an established patient in this practice.  He has a history of bilateral slipped capital femoral epiphysis (SCFE) and did have screw fixation in both hips placed when he was 44 years old.  He now has bilateral hip and groin pain with the left worse than the right.  This is been going on for several years now.  He has had multiple injections in both hips and it used help but is gotten to the point where it has not helped.  His bilateral hip pain is detriment affecting his mobility, his quality of life and his actives daily living.  He is a diabetic and is unsure of his hemoglobin A1c.  He is obese with a BMI of 43.9.  He does have chronic back issues and this may be related more to his hips because when he does get a hip injection his pain does subside significantly.  HPI  Review of Systems Today he denies any headache, chest pain, shortness of breath, fever, chills, nausea, vomiting  Objective: Vital  Signs: Ht 5\' 6"  (1.676 m)   Wt 272 lb (123.4 kg)   BMI 43.90 kg/m   Physical Exam He is alert and orient x3 and in no acute distress Ortho Exam Examination of both hips shows significant limitations with internal and external rotation.  Both his thighs are actually atrophied and his abdomen is easy to mobilize in order to perform direct anterior hip replacement surgery.  He has well-healed large incisions of the lateral aspect of both hips from his previous surgery as a child. Specialty Comments:  No specialty comments available.  Imaging: XR HIP UNILAT W OR W/O PELVIS 1V LEFT  Result Date: 10/28/2019 An AP pelvis shows significant arthritis of both hips with retained screws from slipped capital femoral epiphysis surgery.  There is joint space narrowing with flattening of the femoral head  bilaterally.  There is periarticular osteophytes as well.  XR Lumbar Spine 2-3 Views  Result Date: 10/28/2019 2 views of the lumbar spine show degenerative changes at multiple levels.    PMFS History: Patient Active Problem List   Diagnosis Date Noted  . Unilateral primary osteoarthritis, right hip 10/28/2019  . Unilateral primary osteoarthritis, left hip 10/28/2019  . Retained orthopedic hardware 10/28/2019  . 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  . Thrombocytopenia, unspecified (HCC) 07/03/2018  . Type 2 diabetes mellitus without complication (HCC) 07/03/2018  . OSA (obstructive sleep apnea) 07/03/2018  . Excessive daytime sleepiness 07/03/2018  . Class 3 severe obesity due to excess calories with serious comorbidity and body mass index (BMI) of 40.0 to 44.9 in adult (HCC) 07/03/2018  . Non-seasonal allergic rhinitis due to pollen 07/03/2018  . TIA (transient ischemic attack) 10/07/2017   Past Medical History:  Diagnosis Date  . Acute pancreatitis 07/2010  . Anxiety   . Arthritis    "lower back and shoulders" (10/07/2017)  . Chronic lower back pain   . Dyslipidemia   . Headache    "weekly" (10/07/2017)  . Hypercholesteremia   . Hypertension   . MRSA (methicillin resistant staph aureus) culture positive   . Obesity   . Obesity   . OSA on CPAP   . Peripheral neuropathy   . Scoliosis   . Sleep apnea    "can't tolerate any of the masks" (10/07/2017)  . Stroke [facility])    stroke-like 2.5 years ago  . TIA (transient ischemic attack)   . Type II diabetes mellitus (HCC)   . Umbilical hernia     Family History  Problem Relation Age of Onset  . Hypertension Mother   . Hypertension Father     Past Surgical History:  Procedure Laterality Date  . HIP PINNING Bilateral    "they are worn out" (10/07/2017)  . PICC LINE INSERTION  07/2010   "in hospital for ~ 2 months; took PICC out when I was discharged"  . POSTERIOR CERVICAL FUSION/FORAMINOTOMY N/A 05/26/2019    Procedure: POSTERIOR CERVICAL FUSION WITH LATERAL MASS FIXATION CERVICAL THREE- THORACIC ONE;  Surgeon: 05/28/2019, MD;  Location: [facility] OR;  Service: Neurosurgery;  Laterality: N/A;  posterior, Cervical/Thoracic  . 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 Not on file  Tobacco Use  . Smoking status: Current Every Day Smoker    Packs/day: 0.50    Years: 20.00    Pack years: 10.00    Types: Cigarettes  . Smokeless tobacco: Former 2013  Types: Snuff  . Tobacco comment: 10/07/2017 "quit years ago"  Vaping Use  . Vaping Use: Never used  Substance and Sexual Activity  . Alcohol use: Not Currently  . Drug use: No  . Sexual activity: Yes

## 2019-10-28 NOTE — Patient Instructions (Addendum)
Continue using CPAP nightly and greater than 4 hours each night If your symptoms worsen or you develop new symptoms please let us know.   Hello

## 2019-10-29 ENCOUNTER — Ambulatory Visit (INDEPENDENT_AMBULATORY_CARE_PROVIDER_SITE_OTHER): Payer: BC Managed Care – PPO | Admitting: Physical Therapy

## 2019-10-29 ENCOUNTER — Other Ambulatory Visit: Payer: Self-pay

## 2019-10-29 ENCOUNTER — Encounter: Payer: Self-pay | Admitting: Physical Therapy

## 2019-10-29 DIAGNOSIS — G8929 Other chronic pain: Secondary | ICD-10-CM

## 2019-10-29 DIAGNOSIS — M6281 Muscle weakness (generalized): Secondary | ICD-10-CM

## 2019-10-29 DIAGNOSIS — M25512 Pain in left shoulder: Secondary | ICD-10-CM | POA: Diagnosis not present

## 2019-10-29 DIAGNOSIS — R293 Abnormal posture: Secondary | ICD-10-CM | POA: Diagnosis not present

## 2019-10-29 LAB — HEMOGLOBIN A1C
Hgb A1c MFr Bld: 7.4 % of total Hgb — ABNORMAL HIGH (ref <5.7)
Mean Plasma Glucose: 166 (calc)
eAG (mmol/L): 9.2 (calc)

## 2019-10-29 LAB — EXTRA SPECIMEN

## 2019-10-29 NOTE — Therapy (Signed)
[facility] [address] [place], [place], [address] Phone: [phone]   Fax:  [phone]  Physical Therapy Treatment  Patient Details  Name: [name] MRN: [id number] Date of Birth: 17-Feb-1976 Referring Provider (PT): Aldean Baker MD   Encounter Date: 10/29/2019   PT End of Session - 10/29/19 1518    Visit Number 2    Number of Visits 8    PT Start Time 1516    PT Stop Time 1558    PT Time Calculation (min) 42 min    Activity Tolerance Patient tolerated treatment well;No increased pain;Patient limited by fatigue    Behavior During Therapy [facility] for tasks assessed/performed           Past Medical History:  Diagnosis Date   Acute pancreatitis 07/2010   Anxiety    Arthritis    "lower back and shoulders" (10/07/2017)   Chronic lower back pain    Dyslipidemia    Headache    "weekly" (10/07/2017)   Hypercholesteremia    Hypertension    MRSA (methicillin resistant staph aureus) culture positive    Obesity    Obesity    OSA on CPAP    Peripheral neuropathy    Scoliosis    Sleep apnea    "can't tolerate any of the masks" (10/07/2017)   Stroke (HCC)    stroke-like 2.5 years ago   TIA (transient ischemic attack)    Type II diabetes mellitus (HCC)    Umbilical hernia     Past Surgical History:  Procedure Laterality Date   HIP PINNING Bilateral    "they are worn out" (10/07/2017)   PICC LINE INSERTION  07/2010   "in hospital for ~ 2 months; took PICC out when I was discharged"   POSTERIOR CERVICAL FUSION/FORAMINOTOMY N/A 05/26/2019   Procedure: POSTERIOR CERVICAL FUSION WITH LATERAL MASS FIXATION CERVICAL THREE- THORACIC ONE;  Surgeon: Bedelia Person, MD;  Location: [facility] OR;  Service: Neurosurgery;  Laterality: N/A;  posterior, Cervical/Thoracic   TESTICLE REMOVAL Right ~ 2012   "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   Subjective Assessment -  10/29/19 1519    Subjective "I try to do the exercises throughout the week, I try to do somethings every.    Patient Stated Goals Strengthen shoulders for reaching and lifting.    Currently in Pain? Yes    Pain Score 2     Pain Location Shoulder    Pain Orientation Left    Pain Descriptors / Indicators Aching;Sore    Aggravating Factors  using the arm, reaching over head    Pain Relieving Factors rest                             [facility] Adult PT Treatment/Exercise - 10/29/19 0001      Neck Exercises: Supine   Neck Retraction 10 reps;5 secs      Shoulder Exercises: Supine   Protraction 20 reps   maintained protraction with rhythmic stabilzation   Other Supine Exercises scapular retraction with ER 2 x 12 with red theraband      Shoulder Exercises: Seated   Row 10 reps;Theraband   maintain chin tuck x 2 sets    Theraband Level (Shoulder Row) Level 2 (Red)    External Rotation Left;10 reps;Theraband    Theraband Level (Shoulder External Rotation) Level 2 (Red)    Internal Rotation  Strengthening;Left;10 reps;Theraband    Theraband Level (Shoulder Internal Rotation) Level 2 (Red)    Flexion 10 reps;Both   scaption angle x 2 sets     Shoulder Exercises: ROM/Strengthening   Other ROM/Strengthening Exercises wall walks flexion 1 x 10 with lowering focusing on eccentric loading x 3-4 seconds      Shoulder Exercises: Stretch   Other Shoulder Stretches upper trap stretch L 2 x 30    Other Shoulder Stretches door way stretch for upper pec stretch (hands by hips      Manual Therapy   Manual therapy comments MTPR for the L levator scapulae, rhomboids and upper trap.                        PT Long Term Goals - 10/15/19 1618      PT LONG TERM GOAL #1   Title [name] will report L shoulder pain consistently 0-2/10 on the Numeric Pain Rating Scale.    Time 8    Period Weeks    Status New    Target Date 12/10/19      PT LONG TERM GOAL #2   Title [name]  will report the ability to reach and function overhead at work and home with at least 90% function.    Baseline < 50%    Time 8    Period Weeks    Status New    Target Date 12/10/19      PT LONG TERM GOAL #3   Title Improve L shoulder strength to 5/5 MMT.    Time 8    Period Weeks    Status New    Target Date 12/10/19      PT LONG TERM GOAL #4   Title [name] will be independent with his long-term DC HEP.    Time 8    Period Weeks    Status New    Target Date 12/10/19                 Plan - 10/29/19 1600    Clinical Impression Statement pt reports being consistent with his exericses. continued strengtheing the rotator cuff as well as scapular stabilizers. He demonstrates limited active L shoulder flexion focused on wall walks with eccentric loading to promote overhead reaching strength. no report of pain following session.    PT Treatment/Interventions ADLs/Self Care Home Management;Cryotherapy;Therapeutic exercise;Therapeutic activities;Neuromuscular re-education;Patient/family education;Manual techniques    PT Next Visit Plan Progress scapular, RTC and postural strength.    Consulted and Agree with Plan of Care Patient           Patient will benefit from skilled therapeutic intervention in order to improve the following deficits and impairments:  Decreased activity tolerance, Decreased endurance, Decreased range of motion, Decreased strength, Impaired UE functional use, Postural dysfunction, Pain  Visit Diagnosis: Abnormal posture  Chronic left shoulder pain  Muscle weakness (generalized)     Problem List Patient Active Problem List   Diagnosis Date Noted   Unilateral primary osteoarthritis, right hip 10/28/2019   Unilateral primary osteoarthritis, left hip 10/28/2019   Retained orthopedic hardware 10/28/2019   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   Thrombocytopenia, unspecified (HCC) 07/03/2018   Type 2 diabetes mellitus  without complication (HCC) 07/03/2018   OSA (obstructive sleep apnea) 07/03/2018   Excessive daytime sleepiness 07/03/2018   Class 3 severe obesity due to excess calories with serious comorbidity and body mass index (BMI) of 40.0 to 44.9 in adult [facility])  07/03/2018   Non-seasonal allergic rhinitis due to pollen 07/03/2018   TIA (transient ischemic attack) 10/07/2017    Lulu Riding PT, DPT, LAT, ATC  10/29/19  4:07 PM      [facility] [address] [place], [place], [address] Phone: [phone]   Fax:  [phone]  Name: Mando Blatz MRN: [id number] Date of Birth: June 30, 1975

## 2019-11-04 ENCOUNTER — Telehealth: Payer: Self-pay | Admitting: Podiatry

## 2019-11-04 ENCOUNTER — Ambulatory Visit: Payer: BC Managed Care – PPO | Admitting: Physician Assistant

## 2019-11-04 ENCOUNTER — Encounter: Payer: Self-pay | Admitting: Orthopedic Surgery

## 2019-11-04 VITALS — Ht 66.0 in | Wt 272.0 lb

## 2019-11-04 DIAGNOSIS — M25511 Pain in right shoulder: Secondary | ICD-10-CM | POA: Diagnosis not present

## 2019-11-04 DIAGNOSIS — M25512 Pain in left shoulder: Secondary | ICD-10-CM

## 2019-11-04 DIAGNOSIS — G8929 Other chronic pain: Secondary | ICD-10-CM | POA: Diagnosis not present

## 2019-11-04 NOTE — Progress Notes (Signed)
Office Visit Note   Patient: [name]           Date of Birth: April 12, 1975           MRN: [id number] Visit Date: 11/04/2019              Requested by: Jarome Matin, MD [address] [place],  [place] [address] PCP: Jarome Matin, MD  Chief Complaint  Patient presents with  . Right Shoulder - Follow-up  . Left Shoulder - Follow-up      HPI: Patient presents today for his bilateral shoulders.  He has a history of right shoulder weakness.  He did not have significant pain.  He was referred to physical therapy.  He has been to physical therapy twice.  He does have more visits planned.  He does think it is helping with his strength  Assessment & Plan: Visit Diagnoses: No diagnosis found.  Plan: Continue with physical therapy follow-up in 4 weeks if he still having difficulties  Follow-Up Instructions: No follow-ups on file.   Ortho Exam  Patient is alert, oriented, no adenopathy, well-dressed, normal affect, normal respiratory effort. Bilateral shoulders: He does have forward elevation to 170 degrees he has abduction to almost 90 degrees.  Strength is fair slightly better in the right than the left.  He has internal rotation behind the back to mid back on the right and to just above the belt line on the left.  No tenderness to palpation over the [facility] joint.  Imaging: No results found. No images are attached to the encounter.  Labs: Lab Results  Component Value Date   HGBA1C 7.4 (H) 10/28/2019   HGBA1C 7.5 (H) 05/24/2019   HGBA1C 9.4 (H) 10/07/2017   REPTSTATUS 09/27/2012 FINAL 09/25/2012   GRAMSTAIN  07/28/2010    NO WBC SEEN NO SQUAMOUS EPITHELIAL CELLS SEEN NO ORGANISMS SEEN   GRAMSTAIN  07/28/2010    RARE WBC PRESENT,BOTH PMN AND MONONUCLEAR NO SQUAMOUS EPITHELIAL CELLS SEEN NO ORGANISMS SEEN   CULT STREPTOCOCCUS,BETA HEMOLYIC NOT GROUP A 09/25/2012     Lab Results  Component Value Date   ALBUMIN 3.6 10/28/2016   ALBUMIN 3.3 (L) 10/11/2015   ALBUMIN  3.6 09/12/2010   PREALBUMIN 11.0 (L) 08/20/2010   PREALBUMIN 13.4 (L) 08/06/2010   PREALBUMIN 7.4 (L) 08/02/2010    Lab Results  Component Value Date   MG 2.0 08/23/2010   MG 1.8 08/20/2010   MG 2.1 08/16/2010   No results found for: VD25OH  Lab Results  Component Value Date   PREALBUMIN 11.0 (L) 08/20/2010   PREALBUMIN 13.4 (L) 08/06/2010   PREALBUMIN 7.4 (L) 08/02/2010   CBC EXTENDED Latest Ref Rng & Units 05/24/2019 10/07/2017 06/22/2017  WBC 4.0 - 10.5 K/uL 7.6 7.6 9.6  RBC 4.22 - 5.81 MIL/uL 4.95 4.81 5.34  HGB 13.0 - 17.0 g/dL 86.7 61.9 50.9  HCT 39 - 52 % 48.2 43.0 48.7  PLT 150 - 400 K/uL 107(L) 98(L) 124(L)  NEUTROABS 1.7 - 7.7 K/uL - 5.2 4.5  LYMPHSABS 0.7 - 4.0 K/uL - 2.0 4.5(H)     Body mass index is 43.9 kg/m.  Orders:  No orders of the defined types were placed in this encounter.  No orders of the defined types were placed in this encounter.    Procedures: No procedures performed  Clinical Data: No additional findings.  ROS:  All other systems negative, except as noted in the HPI. Review of Systems  Objective: Vital Signs: Ht 5\' 6"  (1.676  m)   Wt 272 lb (123.4 kg)   BMI 43.90 kg/m   Specialty Comments:  No specialty comments available.  PMFS History: Patient Active Problem List   Diagnosis Date Noted  . Unilateral primary osteoarthritis, right hip 10/28/2019  . Unilateral primary osteoarthritis, left hip 10/28/2019  . Retained orthopedic hardware 10/28/2019  . 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  . Thrombocytopenia, unspecified (HCC) 07/03/2018  . Type 2 diabetes mellitus without complication (HCC) 07/03/2018  . OSA (obstructive sleep apnea) 07/03/2018  . Excessive daytime sleepiness 07/03/2018  . Class 3 severe obesity due to excess calories with serious comorbidity and body mass index (BMI) of 40.0 to 44.9 in adult (HCC) 07/03/2018  . Non-seasonal allergic rhinitis due to pollen 07/03/2018  . TIA (transient  ischemic attack) 10/07/2017   Past Medical History:  Diagnosis Date  . Acute pancreatitis 07/2010  . Anxiety   . Arthritis    "lower back and shoulders" (10/07/2017)  . Chronic lower back pain   . Dyslipidemia   . Headache    "weekly" (10/07/2017)  . Hypercholesteremia   . Hypertension   . MRSA (methicillin resistant staph aureus) culture positive   . Obesity   . Obesity   . OSA on CPAP   . Peripheral neuropathy   . Scoliosis   . Sleep apnea    "can't tolerate any of the masks" (10/07/2017)  . Stroke [facility])    stroke-like 2.5 years ago  . TIA (transient ischemic attack)   . Type II diabetes mellitus (HCC)   . Umbilical hernia     Family History  Problem Relation Age of Onset  . Hypertension Mother   . Hypertension Father     Past Surgical History:  Procedure Laterality Date  . HIP PINNING Bilateral    "they are worn out" (10/07/2017)  . PICC LINE INSERTION  07/2010   "in hospital for ~ 2 months; took PICC out when I was discharged"  . POSTERIOR CERVICAL FUSION/FORAMINOTOMY N/A 05/26/2019   Procedure: POSTERIOR CERVICAL FUSION WITH LATERAL MASS FIXATION CERVICAL THREE- THORACIC ONE;  Surgeon: Bedelia Person, MD;  Location: [facility] OR;  Service: Neurosurgery;  Laterality: N/A;  posterior, Cervical/Thoracic  . 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 Not on file  Tobacco Use  . Smoking status: Current Every Day Smoker    Packs/day: 0.50    Years: 20.00    Pack years: 10.00    Types: Cigarettes  . Smokeless tobacco: Former Neurosurgeon    Types: Snuff  . Tobacco comment: 10/07/2017 "quit years ago"  Vaping Use  . Vaping Use: Never used  Substance and Sexual Activity  . Alcohol use: Not Currently  . Drug use: No  . Sexual activity: Yes

## 2019-11-04 NOTE — Telephone Encounter (Signed)
Pt left message asking about replacing the padding on his inserts.  I returned call and left message we do refurbish the orthotics and it is 90.00. We just ask pt to drop off the orthotics and pay the 90.00 fee and I will call when they are in for him to pick up.Marland Kitchen

## 2019-11-05 ENCOUNTER — Encounter: Payer: BC Managed Care – PPO | Admitting: Rehabilitative and Restorative Service Providers"

## 2019-11-13 ENCOUNTER — Encounter (HOSPITAL_COMMUNITY): Payer: Self-pay

## 2019-11-13 ENCOUNTER — Ambulatory Visit (HOSPITAL_COMMUNITY)
Admission: EM | Admit: 2019-11-13 | Discharge: 2019-11-13 | Disposition: A | Discharge status: Home / Self Care | Payer: BC Managed Care – PPO | Attending: Urgent Care | Admitting: Urgent Care

## 2019-11-13 ENCOUNTER — Other Ambulatory Visit: Payer: Self-pay

## 2019-11-13 DIAGNOSIS — R1031 Right lower quadrant pain: Secondary | ICD-10-CM | POA: Diagnosis not present

## 2019-11-13 DIAGNOSIS — L02214 Cutaneous abscess of groin: Secondary | ICD-10-CM

## 2019-11-13 MED ORDER — DOXYCYCLINE HYCLATE 100 MG PO CAPS: 100.0000 mg | ORAL_CAPSULE | Freq: Two times a day (BID) | ORAL | 0 refills | Status: DC

## 2019-11-13 NOTE — ED Triage Notes (Signed)
Pt c/o abscess in perineum areax5 days.

## 2019-11-13 NOTE — Discharge Instructions (Addendum)
Please change your dressing 3-5 times daily. Do not apply any ointments or creams. Each time you change your dressing, make sure that you are pressing on the wound to get pus to come out.  Try your best to have a family member help you clean gently around the perimeter of the wound with gentle soap and warm water. Pat your wound dry and let it air out if possible to make sure it is dry before reapplying another dressing.    Do not use any nonsteroidal anti-inflammatories (NSAIDs) like ibuprofen, Motrin, naproxen, Aleve, etc. which are all available over-the-counter.  Please just use Tylenol at a dose of 500mg-650mg once every 6 hours as needed for your aches, pains, fevers.  

## 2019-11-13 NOTE — ED Provider Notes (Signed)
[name] - [facility]   MRN: [id number] DOB: October 02, 1975  Subjective:   [name] is a 44 y.o. male presenting for 5-day history of recurrent abscess over the genital area.  Patient states that he previously had this back in June and took a razor blade to lance it himself.  States that he had intermittent resolution but it came back.  Wanted to make sure that we evaluated him.  He does have a history of MRSA.  Has not seen his PCP for this.  Has a history of diabetes.  Denies fever, nausea, vomiting, testicular pain, perianal pain.  No current facility-administered medications for this encounter.  Current Outpatient Medications:    amLODipine (NORVASC) 10 MG tablet, Take 1 tablet (10 mg total) by mouth daily., Disp: 90 tablet, Rfl: 0   Dapagliflozin Propanediol (FARXIGA PO), Take by mouth., Disp: , Rfl:    escitalopram (LEXAPRO) 20 MG tablet, Take 20 mg by mouth daily., Disp: , Rfl:    gabapentin (NEURONTIN) 300 MG capsule, Take 300 mg by mouth 2 (two) times daily., Disp: , Rfl:    HUMALOG KWIKPEN 100 UNIT/ML KwikPen, Inject 10-15 Units into the skin 3 (three) times daily before meals. , Disp: , Rfl:    Insulin Glargine (TOUJEO SOLOSTAR [place]), Inject 54 Units into the skin 2 (two) times daily. , Disp: , Rfl:    metFORMIN (GLUCOPHAGE-XR) 500 MG 24 hr tablet, Take 2 tablets (1,000 mg total) by mouth 2 (two) times daily with a meal., Disp: 120 tablet, Rfl: 2   propranolol (INDERAL) 10 MG tablet, Take 10 mg by mouth 3 (three) times daily., Disp: , Rfl:    Allergies  Allergen Reactions   Losartan Swelling and Rash    Patient says 'he's not 100% sure if it was losartan, but he thinks it is." He knows it was a blood pressure medicine.    Past Medical History:  Diagnosis Date   Acute pancreatitis 07/2010   Anxiety    Arthritis    "lower back and shoulders" (10/07/2017)   Chronic lower back pain    Dyslipidemia    Headache    "weekly" (10/07/2017)   Hypercholesteremia     Hypertension    MRSA (methicillin resistant staph aureus) culture positive    Obesity    Obesity    OSA on CPAP    Peripheral neuropathy    Scoliosis    Sleep apnea    "can't tolerate any of the masks" (10/07/2017)   Stroke (HCC)    stroke-like 2.5 years ago   TIA (transient ischemic attack)    Type II diabetes mellitus (HCC)    Umbilical hernia      Past Surgical History:  Procedure Laterality Date   HIP PINNING Bilateral    "they are worn out" (10/07/2017)   PICC LINE INSERTION  07/2010   "in hospital for ~ 2 months; took PICC out when I was discharged"   POSTERIOR CERVICAL FUSION/FORAMINOTOMY N/A 05/26/2019   Procedure: POSTERIOR CERVICAL FUSION WITH LATERAL MASS FIXATION CERVICAL THREE- THORACIC ONE;  Surgeon: Bedelia Person, MD;  Location: [facility] OR;  Service: Neurosurgery;  Laterality: N/A;  posterior, Cervical/Thoracic   TESTICLE REMOVAL Right ~ 2012   "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"    Family History  Problem Relation Age of Onset   Hypertension Mother    Hypertension Father     Social History   Tobacco Use   Smoking status: Former Smoker    Packs/day:  0.50    Years: 20.00    Pack years: 10.00    Types: Cigarettes   Smokeless tobacco: Former Neurosurgeon    Types: Snuff   Tobacco comment: 10/07/2017 "quit years ago"  Vaping Use   Vaping Use: Never used  Substance Use Topics   Alcohol use: Not Currently   Drug use: No    ROS   Objective:   Vitals: BP (!) 141/85    Pulse 72    Temp (!) 97.5 F (36.4 C) (Oral)    Resp 18    Ht 5\' 6"  (1.676 m)    Wt 274 lb (124.3 kg)    SpO2 98%    BMI 44.22 kg/m   Physical Exam Constitutional:      General: He is not in acute distress.    Appearance: Normal appearance. He is well-developed and normal weight. He is not ill-appearing, toxic-appearing or diaphoretic.  HENT:     Head: Normocephalic and atraumatic.     Right Ear: External ear normal.     Left Ear: External  ear normal.     Nose: Nose normal.     Mouth/Throat:     Pharynx: Oropharynx is clear.  Eyes:     General: No scleral icterus.       Right eye: No discharge.        Left eye: No discharge.     Extraocular Movements: Extraocular movements intact.     Pupils: Pupils are equal, round, and reactive to light.  Cardiovascular:     Rate and Rhythm: Normal rate.  Pulmonary:     Effort: Pulmonary effort is normal.  Genitourinary:   Musculoskeletal:     Cervical back: Normal range of motion.  Neurological:     Mental Status: He is alert and oriented to person, place, and time.  Psychiatric:        Mood and Affect: Mood normal.        Behavior: Behavior normal.        Thought Content: Thought content normal.        Judgment: Judgment normal.     PROCEDURE NOTE: I&D of Abscess Verbal consent obtained. Local anesthesia with 2cc of 1% lidocaine with epinephrine. Site cleansed with Betadine. Incision of 1/2cm was made using a 11 blade, discharge of 2cc of pus and serosanguinous fluid. Wound cavity was explored with curved hemostats and cleansed and dressed.  Assessment and Plan :   PDMP not reviewed this encounter.  1. Abscess of groin, right   2. Groin pain, right     Successful I&D performed.  Wound care reviewed.  Start doxycycline for the abscess, APAP for pain and inflammation.  Emphasized need for follow-up with his PCP.  Counseled patient on potential for adverse effects with medications prescribed/recommended today, ER and return-to-clinic precautions discussed, patient verbalized understanding.    , PA-C 11/13/19 1556

## 2019-11-19 ENCOUNTER — Encounter: Payer: BC Managed Care – PPO | Admitting: Rehabilitative and Restorative Service Providers"

## 2019-12-03 ENCOUNTER — Other Ambulatory Visit: Payer: Self-pay

## 2019-12-03 ENCOUNTER — Encounter: Payer: Self-pay | Admitting: Rehabilitative and Restorative Service Providers"

## 2019-12-03 ENCOUNTER — Ambulatory Visit (INDEPENDENT_AMBULATORY_CARE_PROVIDER_SITE_OTHER): Payer: BC Managed Care – PPO | Admitting: Rehabilitative and Restorative Service Providers"

## 2019-12-03 DIAGNOSIS — M6281 Muscle weakness (generalized): Secondary | ICD-10-CM | POA: Diagnosis not present

## 2019-12-03 DIAGNOSIS — M25512 Pain in left shoulder: Secondary | ICD-10-CM | POA: Diagnosis not present

## 2019-12-03 DIAGNOSIS — R293 Abnormal posture: Secondary | ICD-10-CM | POA: Diagnosis not present

## 2019-12-03 DIAGNOSIS — G8929 Other chronic pain: Secondary | ICD-10-CM | POA: Diagnosis not present

## 2019-12-03 NOTE — Therapy (Addendum)
[facility] Physical Therapy [address] [place], [place], [address] Phone: [phone]   Fax:  [phone]  Physical Therapy Treatment  Patient Details  Name: [name] MRN: [id number] Date of Birth: 03/01/1976 Referring Provider (PT): Meridee Score MD  PHYSICAL THERAPY DISCHARGE SUMMARY  Visits from Start of Care: 3  Current functional level related to goals / functional outcomes: See note   Remaining deficits: See note   Education / Equipment: HEP  Plan:                                                    Patient goals were not met. Patient is being discharged due to not returning since the last visit.  ?????     Encounter Date: 12/03/2019   PT End of Session - 12/03/19 1609    Visit Number 3    Number of Visits 8    PT Start Time 7096    PT Stop Time 1600    PT Time Calculation (min) 47 min    Activity Tolerance Patient tolerated treatment well;No increased pain;Patient limited by fatigue    Behavior During Therapy [facility] for tasks assessed/performed           Past Medical History:  Diagnosis Date  . Acute pancreatitis 07/2010  . Anxiety   . Arthritis    "lower back and shoulders" (10/07/2017)  . Chronic lower back pain   . Dyslipidemia   . Headache    "weekly" (10/07/2017)  . Hypercholesteremia   . Hypertension   . MRSA (methicillin resistant staph aureus) culture positive   . Obesity   . Obesity   . OSA on CPAP   . Peripheral neuropathy   . Scoliosis   . Sleep apnea    "can't tolerate any of the masks" (10/07/2017)  . Stroke [facility])    stroke-like 2.5 years ago  . TIA (transient ischemic attack)   . Type II diabetes mellitus ([place])   . Umbilical hernia     Past Surgical History:  Procedure Laterality Date  . HIP PINNING Bilateral    "they are worn out" (10/07/2017)  . PICC LINE INSERTION  07/2010   "in hospital for ~ 2 months; took PICC out when I was discharged"  . POSTERIOR CERVICAL FUSION/FORAMINOTOMY N/A 05/26/2019    Procedure: POSTERIOR CERVICAL FUSION WITH LATERAL MASS FIXATION CERVICAL THREE- THORACIC ONE;  Surgeon: Vallarie Mare, MD;  Location: [place];  Service: Neurosurgery;  Laterality: N/A;  posterior, Cervical/Thoracic  . 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   Subjective Assessment - 12/03/19 1606    Subjective [name] reports good compliance with his HEP.  He can now reach higher and get dressed easier than before starting PT.    Limitations Lifting    Patient Stated Goals Strengthen shoulders for reaching and lifting.    Currently in Pain? No/denies    Aggravating Factors  Reaching overhead or behind the back.    Pain Relieving Factors Rest.    Effect of Pain on Daily Activities Although improving, some activities at work [employer]) are affected.    Multiple Pain Sites No  [place] Adult PT Treatment/Exercise - 12/03/19 0001      Posture/Postural Control   Posture/Postural Control Postural limitations    Postural Limitations Forward head;Rounded Shoulders;Decreased lumbar lordosis      Exercises   Exercises Shoulder      Shoulder Exercises: Supine   Protraction Strengthening;Both;20 reps   6# 2 sets     Shoulder Exercises: Standing   External Rotation Strengthening;Left;10 reps;Theraband   2 sets   Theraband Level (Shoulder External Rotation) Level 3 (Green)    Internal Rotation Strengthening;Left;10 reps;Theraband   2 sets   Theraband Level (Shoulder Internal Rotation) Level 3 (Green)    Row Strengthening;Both;20 reps;Theraband    Theraband Level (Shoulder Row) Level 3 (Green)    Other Standing Exercises Triceps theraband 20X Red slow eccentrics    Other Standing Exercises Elbow flexion (Biceps curls with supination) 6# 20X slow eccentrics                  PT Education - 12/03/19 1608    Education Details Reviewed  and added to HEP.    Person(s) Educated Patient    Methods Explanation;Demonstration;Verbal cues;Handout    Comprehension Verbalized understanding;Returned demonstration;Verbal cues required               PT Long Term Goals - 12/03/19 1608      PT LONG TERM GOAL #1   Title [name] will report L shoulder pain consistently 0-2/10 on the Numeric Pain Rating Scale.    Time 8    Period Weeks    Status Achieved      PT LONG TERM GOAL #2   Title [name] will report the ability to reach and function overhead at work and home with at least 90% function.    Baseline < 50% at evaluation, 70% on 12/03/19    Time 8    Period Weeks    Status On-going      PT LONG TERM GOAL #3   Title Improve L shoulder strength to 5/5 MMT.    Time 8    Period Weeks    Status On-going      PT LONG TERM GOAL #4   Title [name] will be independent with his long-term DC HEP.    Time 8    Period Weeks    Status Achieved                 Plan - 12/03/19 1609    Clinical Impression Statement [name] reports progress with his reaching, dressing and overhead function.  Impairments are still present and [name] has requested to continue his exercises independently for now as he is a single father of 4 who works 53+ hours/week.  He has an open invitation to return to supervised PT if requested.    Personal Factors and Comorbidities Comorbidity 1    Comorbidities Cervical fusion, diabetes    Examination-Activity Limitations Lift;Carry;Reach Overhead    Examination-Participation Restrictions Occupation;Driving    Stability/Clinical Decision Making Stable/Uncomplicated    Clinical Decision Making Low    Rehab Potential Good    PT Frequency Other (comment)   PRN   PT Duration 4 weeks    PT Treatment/Interventions ADLs/Self Care Home Management;Cryotherapy;Therapeutic exercise;Therapeutic activities;Neuromuscular re-education;Patient/family education;Manual techniques    PT Next Visit Plan Progress scapular,  RTC and postural strength as needed.    PT Home Exercise Plan [email or web address].  Access Code: [email or web address]    Consulted and Agree with Plan of Care Patient  Patient will benefit from skilled therapeutic intervention in order to improve the following deficits and impairments:  Decreased activity tolerance, Decreased endurance, Decreased range of motion, Decreased strength, Impaired UE functional use, Postural dysfunction, Pain  Visit Diagnosis: Abnormal posture  Chronic left shoulder pain  Muscle weakness (generalized)     Problem List Patient Active Problem List   Diagnosis Date Noted  . Unilateral primary osteoarthritis, right hip 10/28/2019  . Unilateral primary osteoarthritis, left hip 10/28/2019  . Retained orthopedic hardware 10/28/2019  . Stenosis of cervical spine with myelopathy ([place]) 05/26/2019  . Thrombocytopenia, unspecified ([place]) 07/03/2018  . Type 2 diabetes mellitus without complication ([place]) 54/86/8852  . OSA (obstructive sleep apnea) 07/03/2018  . Excessive daytime sleepiness 07/03/2018  . Class 3 severe obesity due to excess calories with serious comorbidity and body mass index (BMI) of 40.0 to 44.9 in adult ([place]) 07/03/2018  . Non-seasonal allergic rhinitis due to pollen 07/03/2018  . TIA (transient ischemic attack) 10/07/2017    Farley Ly PT, MPT 12/03/2019, [facility] [address] [place], [place], [address] Phone: [phone]   Fax:  [phone]  Name: [name] MRN: [id number] Date of Birth: [birth year]

## 2019-12-03 NOTE — Patient Instructions (Signed)
Access Code: [email or web address] URL: [email or web address] Date: 12/03/2019 Prepared by: Pauletta Browns  Exercises Scapular Retraction with Resistance - 1 x daily - 7 x weekly - 1 sets - 20 reps - 3 seconds hold Supine Isometric Neck Extension - 2-3 x daily - 7 x weekly - 1 sets - 10 reps - 5 seconds hold

## 2019-12-30 ENCOUNTER — Telehealth: Payer: Self-pay

## 2019-12-30 NOTE — Telephone Encounter (Signed)
Yes. That will be fine!

## 2019-12-30 NOTE — Telephone Encounter (Signed)
Scheduled patient for THA.  He would like to do right hip first since it is bothering him more.  Order was written for left hip. Okay to change to right hip?

## 2020-01-17 ENCOUNTER — Ambulatory Visit: Payer: Self-pay

## 2020-01-17 ENCOUNTER — Ambulatory Visit (INDEPENDENT_AMBULATORY_CARE_PROVIDER_SITE_OTHER): Payer: BC Managed Care – PPO | Admitting: Orthopaedic Surgery

## 2020-01-17 ENCOUNTER — Encounter: Payer: Self-pay | Admitting: Orthopaedic Surgery

## 2020-01-17 VITALS — Ht 67.0 in | Wt 277.0 lb

## 2020-01-17 DIAGNOSIS — Z969 Presence of functional implant, unspecified: Secondary | ICD-10-CM | POA: Diagnosis not present

## 2020-01-17 DIAGNOSIS — M1611 Unilateral primary osteoarthritis, right hip: Secondary | ICD-10-CM

## 2020-01-17 DIAGNOSIS — M1612 Unilateral primary osteoarthritis, left hip: Secondary | ICD-10-CM

## 2020-01-17 DIAGNOSIS — M25551 Pain in right hip: Secondary | ICD-10-CM | POA: Diagnosis not present

## 2020-01-17 NOTE — Progress Notes (Signed)
Office Visit Note   Patient: [name]           Date of Birth: 1975/09/16           MRN: [id number] Visit Date: 01/17/2020              Requested by: Jarome Matin, MD [address] [place],  [place] [address] PCP: Jarome Matin, MD   Assessment & Plan: Visit Diagnoses:  1. Pain in right hip   2. Unilateral primary osteoarthritis, right hip   3. Unilateral primary osteoarthritis, left hip   4. Retained orthopedic hardware     Plan: We will hold off on his upcoming left total hip arthroplasty given the fact that he cannot be off from work until the spring time.  We can always schedule hip replacement surgery in the spring when he gets to the point where he can be off from work.  He knows to let us know and to call us.  Since he is having session headache and pain in both his hips, I am fine with Dr. Alvester Morin providing intra-articular steroid injections in both hips under fluoroscopy.  It is just past 3 months since he has had injections before and he has had no additional effect from these injections so I think it is worthwhile trying these again in the near future to temporize his pain well he awaits hip replacement surgery in the spring.  He agrees with this treatment plan.  All question concerns were answered and addressed.  Follow-Up Instructions: No follow-ups on file.   Orders:  Orders Placed This Encounter  Procedures  . XR HIP UNILAT W OR W/O PELVIS 2-3 VIEWS RIGHT   No orders of the defined types were placed in this encounter.     Procedures: No procedures performed   Clinical Data: No additional findings.   Subjective: Chief Complaint  Patient presents with  . Right Hip - Pain, Injury  The patient is actually well-known to me.  He was scheduled to undergo a left total hip arthroplasty next month but needs to cancel that due to not having more time out from his FMLA.  He has known bilateral hip osteoarthritis.  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  He comes in today because his right hip has acute pain after crawling out from a crawl space when he felt a pop in that hip and got significant more groin pain on the right side.  It is subsiding somewhat.  He is walking without assistive device.  He still works daily and works hard.  He is a diabetic but reports better control.  He has had intra-articular injections in both hips under fluoroscopy by Dr. Alvester Morin before.  HPI  Review of Systems He currently denies any headache, chest pain, shortness of breath, fever, chills, nausea, vomiting  Objective: Vital Signs: Ht 5\' 7"  (1.702 m)   Wt 277 lb (125.6 kg)   BMI 43.38 kg/m   Physical Exam He is alert and orient x3 and in no acute distress Ortho Exam Examination of both hips shows stiffness with attempts of internal and external rotation.  There is no worse pain on the right or left hip. Specialty Comments:  No specialty comments available.  Imaging: XR HIP UNILAT W OR W/O PELVIS 2-3 VIEWS RIGHT  Result Date: 01/17/2020 An AP pelvis and lateral right hip shows no acute findings.  This was compared to previous films.  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  There is joint space narrowing with both hips and flattening of the lateral femoral head of both hips.    PMFS History: Patient Active Problem List   Diagnosis Date Noted  . Unilateral primary osteoarthritis, right hip 10/28/2019  . Unilateral primary osteoarthritis, left hip 10/28/2019  . Retained orthopedic hardware 10/28/2019  . 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  . Thrombocytopenia, unspecified (HCC) 07/03/2018  . Type 2 diabetes mellitus without complication (HCC) 07/03/2018  . OSA (obstructive sleep apnea) 07/03/2018  . Excessive daytime sleepiness 07/03/2018  . Class 3 severe obesity due to excess calories with  serious comorbidity and body mass index (BMI) of 40.0 to 44.9 in adult (HCC) 07/03/2018  . Non-seasonal allergic rhinitis due to pollen 07/03/2018  . TIA (transient ischemic attack) 10/07/2017   Past Medical History:  Diagnosis Date  . Acute pancreatitis 07/2010  . Anxiety   . Arthritis    "lower back and shoulders" (10/07/2017)  . Chronic lower back pain   . Dyslipidemia   . Headache    "weekly" (10/07/2017)  . Hypercholesteremia   . Hypertension   . MRSA (methicillin resistant staph aureus) culture positive   . Obesity   . Obesity   . OSA on CPAP   . Peripheral neuropathy   . Scoliosis   . Sleep apnea    "can't tolerate any of the masks" (10/07/2017)  . Stroke [facility])    stroke-like 2.5 years ago  . TIA (transient ischemic attack)   . Type II diabetes mellitus (HCC)   . Umbilical hernia     Family History  Problem Relation Age of Onset  . Hypertension Mother   . Hypertension Father     Past Surgical History:  Procedure Laterality Date  . HIP PINNING Bilateral    "they are worn out" (10/07/2017)  . PICC LINE INSERTION  07/2010   "in hospital for ~ 2 months; took PICC out when I was discharged"  . POSTERIOR CERVICAL FUSION/FORAMINOTOMY N/A 05/26/2019   Procedure: POSTERIOR CERVICAL FUSION WITH LATERAL MASS FIXATION CERVICAL THREE- THORACIC ONE;  Surgeon: Bedelia Person, MD;  Location: [facility] OR;  Service: Neurosurgery;  Laterality: N/A;  posterior, Cervical/Thoracic  . 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 Not on file  Tobacco Use  . Smoking status: Former Smoker    Packs/day: 0.50    Years: 20.00    Pack years: 10.00    Types: Cigarettes  . Smokeless tobacco: Former Neurosurgeon    Types: Snuff  . Tobacco comment: 10/07/2017 "quit years ago"  Vaping Use  . Vaping Use: Never used  Substance and Sexual Activity  . Alcohol use: Not Currently  . Drug use: No  . Sexual activity:  Yes

## 2020-01-18 ENCOUNTER — Other Ambulatory Visit: Payer: Self-pay

## 2020-01-18 DIAGNOSIS — M1611 Unilateral primary osteoarthritis, right hip: Secondary | ICD-10-CM

## 2020-01-18 DIAGNOSIS — M1612 Unilateral primary osteoarthritis, left hip: Secondary | ICD-10-CM

## 2020-02-07 DIAGNOSIS — M4802 Spinal stenosis, cervical region: Secondary | ICD-10-CM | POA: Diagnosis not present

## 2020-02-07 DIAGNOSIS — Z6841 Body Mass Index (BMI) 40.0 and over, adult: Secondary | ICD-10-CM | POA: Diagnosis not present

## 2020-02-07 DIAGNOSIS — M4712 Other spondylosis with myelopathy, cervical region: Secondary | ICD-10-CM | POA: Diagnosis not present

## 2020-02-07 DIAGNOSIS — I10 Essential (primary) hypertension: Secondary | ICD-10-CM | POA: Diagnosis not present

## 2020-02-08 ENCOUNTER — Encounter: Payer: Self-pay | Admitting: Physical Medicine and Rehabilitation

## 2020-02-08 ENCOUNTER — Ambulatory Visit: Payer: Self-pay

## 2020-02-08 ENCOUNTER — Other Ambulatory Visit: Payer: Self-pay

## 2020-02-08 ENCOUNTER — Ambulatory Visit (INDEPENDENT_AMBULATORY_CARE_PROVIDER_SITE_OTHER): Payer: BC Managed Care – PPO | Admitting: Physical Medicine and Rehabilitation

## 2020-02-08 DIAGNOSIS — M25552 Pain in left hip: Secondary | ICD-10-CM

## 2020-02-08 DIAGNOSIS — M25551 Pain in right hip: Secondary | ICD-10-CM

## 2020-02-08 NOTE — Progress Notes (Signed)
Bilateral hip pain right worse than left.  Numeric Pain Rating Scale and Functional Assessment Average Pain 4   In the last MONTH (on 0-10 scale) has pain interfered with the following?  1. General activity like being  able to carry out your everyday physical activities such as walking, climbing stairs, carrying groceries, or moving a chair?  Rating(7)    -BT, -Dye Allergies.

## 2020-02-10 DIAGNOSIS — G4733 Obstructive sleep apnea (adult) (pediatric): Secondary | ICD-10-CM | POA: Diagnosis not present

## 2020-02-14 MED ORDER — TRIAMCINOLONE ACETONIDE 40 MG/ML IJ SUSP
60.0000 mg | INTRAMUSCULAR | Status: AC | PRN
  Administered 2020-02-08: 09:00:00 60 mg via INTRA_ARTICULAR

## 2020-02-14 MED ORDER — BUPIVACAINE HCL 0.5 % IJ SOLN
3.0000 mL | INTRAMUSCULAR | Status: AC | PRN
  Administered 2020-02-08: 09:00:00 3 mL via INTRA_ARTICULAR

## 2020-02-14 NOTE — Progress Notes (Signed)
[name] - 44 y.o. male MRN [id number]  Date of birth: 12-Feb-1976  Office Visit Note: Visit Date: 02/08/2020 PCP: Jarome Matin, MD Referred by: Jarome Matin, MD  Subjective: Chief Complaint  Patient presents with  . Right Hip - Pain  . Left Hip - Pain   HPI:  [name] is a 44 y.o. male who comes in today at the request of Dr. Doneen Poisson for planned Bilateral anesthetic hip arthrogram with fluoroscopic guidance.  The patient has failed conservative care including home exercise, medications, time and activity modification.  This injection will be diagnostic and hopefully therapeutic.  Please see requesting physician notes for further details and justification.  ROS Otherwise per HPI.  Assessment & Plan: Visit Diagnoses:    ICD-10-CM   1. Pain in left hip  M25.552 XR C-ARM NO REPORT  2. Pain in right hip  M25.551 XR C-ARM NO REPORT    Plan: No additional findings.   Meds & Orders: No orders of the defined types were placed in this encounter.   Orders Placed This Encounter  Procedures  . Large Joint Inj  . XR C-ARM NO REPORT    Follow-up: No follow-ups on file.   Procedures: Large Joint Inj: bilateral hip joint on 02/08/2020 9:20 AM Indications: diagnostic evaluation and pain Details: 22 G 3.5 in needle, fluoroscopy-guided anterior approach  Arthrogram: No  Medications (Right): 60 mg triamcinolone acetonide 40 MG/ML; 3 mL bupivacaine 0.5 % Medications (Left): 60 mg triamcinolone acetonide 40 MG/ML; 3 mL bupivacaine 0.5 % Outcome: tolerated well, no immediate complications  There was excellent flow of contrast producing a partial arthrogram of the hip. The patient did have relief of symptoms during the anesthetic phase of the injection. Procedure, treatment alternatives, risks and benefits explained, specific risks discussed. Consent was given by the patient. Immediately prior to procedure a time out was called to verify the correct patient,  procedure, equipment, support staff and site/side marked as required. Patient was prepped and draped in the usual sterile fashion.          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  Joint spacing looks fairly well maintained. There is some inferior  spurring on the right and there is a small area of ossification of the  superior lateral aspect of the acetabulum. There is no fractures or  dislocations. Sacroiliac joints appear well-maintained. Hip height seem to  be equal but there does appear to be some rotation with the left iliac  crest higher than the right. ----  MRI LUMBAR SPINE WITHOUT CONTRAST   Technique: Multiplanar and multiecho pulse sequences of the lumbar  spine were obtained without intravenous contrast.   Comparison: Plain films 06/17/2007   Findings: The sagittal MR images demonstrate normal overall  alignment of the lumbar vertebral bodies. They demonstrate normal  marrow signal. There is disc desiccation at L2-L3 and at L5-S1.  There are moderate degenerative changes for the patient's age. The  last full intervertebral the space is labeled L5-S1. This  correlates with the plain films. The conus medullaris terminates  at L1.   L1-L2: No significant findings   L2-L3: Broad-based shallow disc protrusion with flattening of the  anterior thecal sac. There is also a shallow right foraminal disc  protrusion with right foraminal encroachment.   L3-L4: Moderate sized central and right paracentral disc  protrusion with mass effect on  the anterior thecal sac. Minimal  medial foraminal encroachment also   L4-L5: Moderate to large central disc protrusion with significant  mass effect on the anterior thecal sac. There is also right  lateral recess stenosis and biforaminal stenosis, right greater  than left.   L5-S1: Moderate to large  central and right paracentral disc  protrusion. There is mass effect on the anterior thecal sac.  There is also a moderate sized broad-based left foraminal disc  protrusion which contacts and compresses the left L5 nerve root.  Mild right foraminal stenosis.   IMPRESSION:   1. Multilevel disc disease with specific findings as discussed  above. There are significant disc protrusions at L3-L4, L4-L5 and  L5-S1.     Objective:  VS:  HT:    WT:   BMI:     BP:   HR: bpm  TEMP: ( )  RESP:  Physical Exam   Imaging: No results found.

## 2020-02-24 ENCOUNTER — Ambulatory Visit (INDEPENDENT_AMBULATORY_CARE_PROVIDER_SITE_OTHER): Payer: BC Managed Care – PPO | Admitting: Orthopaedic Surgery

## 2020-02-24 ENCOUNTER — Other Ambulatory Visit: Payer: Self-pay

## 2020-02-24 ENCOUNTER — Encounter: Payer: Self-pay | Admitting: Orthopaedic Surgery

## 2020-02-24 DIAGNOSIS — Z969 Presence of functional implant, unspecified: Secondary | ICD-10-CM | POA: Diagnosis not present

## 2020-02-24 DIAGNOSIS — M1611 Unilateral primary osteoarthritis, right hip: Secondary | ICD-10-CM

## 2020-02-24 DIAGNOSIS — M1612 Unilateral primary osteoarthritis, left hip: Secondary | ICD-10-CM

## 2020-02-24 NOTE — Progress Notes (Signed)
The patient comes in today having recent steroid injections in both hips under fluoroscopy.  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  Right hurts worse than left.  He is in need of hip replacement surgery and we have recommended that before.  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  We have discussed the surgery with him in detail and his last office note can be seen in terms of what was discussed about surgery.  He does a surgical be quite difficult removing hardware and then doing a hip replacement.  He is also obese and that makes it more difficult as well.  He has her surgery scheduler's card.  At this point the steroid injections are helping temporize his pain.  He will call us when he would like to have the surgery scheduled.  All question concerns were answered and addressed.  This would be for a right total hip arthroplasty first with also removal of retained orthopedic screws.

## 2020-03-28 DIAGNOSIS — L988 Other specified disorders of the skin and subcutaneous tissue: Secondary | ICD-10-CM | POA: Diagnosis not present

## 2020-03-28 DIAGNOSIS — L089 Local infection of the skin and subcutaneous tissue, unspecified: Secondary | ICD-10-CM | POA: Diagnosis not present

## 2020-03-29 DIAGNOSIS — M722 Plantar fascial fibromatosis: Secondary | ICD-10-CM

## 2020-03-30 DIAGNOSIS — E1140 Type 2 diabetes mellitus with diabetic neuropathy, unspecified: Secondary | ICD-10-CM | POA: Diagnosis not present

## 2020-03-30 DIAGNOSIS — Z7189 Other specified counseling: Secondary | ICD-10-CM | POA: Diagnosis not present

## 2020-03-31 ENCOUNTER — Encounter: Payer: Self-pay | Admitting: Vascular Surgery

## 2020-03-31 ENCOUNTER — Ambulatory Visit (HOSPITAL_COMMUNITY)
Admission: RE | Admit: 2020-03-31 | Discharge: 2020-03-31 | Disposition: A | Discharge status: Home / Self Care | Payer: BC Managed Care – PPO | Source: Ambulatory Visit | Attending: Internal Medicine | Admitting: Internal Medicine

## 2020-03-31 ENCOUNTER — Other Ambulatory Visit: Payer: Self-pay

## 2020-03-31 ENCOUNTER — Other Ambulatory Visit (HOSPITAL_COMMUNITY): Payer: Self-pay | Admitting: Internal Medicine

## 2020-03-31 DIAGNOSIS — H539 Unspecified visual disturbance: Secondary | ICD-10-CM

## 2020-04-18 DIAGNOSIS — L732 Hidradenitis suppurativa: Secondary | ICD-10-CM | POA: Diagnosis not present

## 2020-04-18 DIAGNOSIS — Z79899 Other long term (current) drug therapy: Secondary | ICD-10-CM | POA: Diagnosis not present

## 2020-04-18 DIAGNOSIS — L089 Local infection of the skin and subcutaneous tissue, unspecified: Secondary | ICD-10-CM | POA: Diagnosis not present

## 2020-05-01 ENCOUNTER — Other Ambulatory Visit: Payer: Self-pay

## 2020-05-01 ENCOUNTER — Ambulatory Visit (INDEPENDENT_AMBULATORY_CARE_PROVIDER_SITE_OTHER): Payer: BC Managed Care – PPO | Admitting: Podiatry

## 2020-05-01 ENCOUNTER — Encounter: Payer: Self-pay | Admitting: Podiatry

## 2020-05-01 DIAGNOSIS — N454 Abscess of epididymis or testis: Secondary | ICD-10-CM | POA: Insufficient documentation

## 2020-05-01 DIAGNOSIS — M722 Plantar fascial fibromatosis: Secondary | ICD-10-CM | POA: Diagnosis not present

## 2020-05-02 NOTE — Progress Notes (Signed)
Subjective: Patient presents today for orthotic casting.  Objective: Patient presented for a foam casting for orthotic impressions.  A: Plantar fasciitis  Plan: Patient was informed it could take up to 4 weeks for the orthotics to come back and that we would call the patient when the orthotics came in and to schedule an appointment.   [name] Madani-Cma

## 2020-06-05 DIAGNOSIS — S129XXA Fracture of neck, unspecified, initial encounter: Secondary | ICD-10-CM | POA: Diagnosis not present

## 2020-06-05 DIAGNOSIS — M4802 Spinal stenosis, cervical region: Secondary | ICD-10-CM | POA: Diagnosis not present

## 2020-06-07 DIAGNOSIS — E786 Lipoprotein deficiency: Secondary | ICD-10-CM | POA: Diagnosis not present

## 2020-06-07 DIAGNOSIS — E1151 Type 2 diabetes mellitus with diabetic peripheral angiopathy without gangrene: Secondary | ICD-10-CM | POA: Diagnosis not present

## 2020-06-07 DIAGNOSIS — Z125 Encounter for screening for malignant neoplasm of prostate: Secondary | ICD-10-CM | POA: Diagnosis not present

## 2020-06-12 DIAGNOSIS — Z0000 Encounter for general adult medical examination without abnormal findings: Secondary | ICD-10-CM | POA: Diagnosis not present

## 2020-06-12 DIAGNOSIS — M5137 Other intervertebral disc degeneration, lumbosacral region: Secondary | ICD-10-CM | POA: Diagnosis not present

## 2020-06-12 DIAGNOSIS — R8290 Unspecified abnormal findings in urine: Secondary | ICD-10-CM | POA: Diagnosis not present

## 2020-06-12 DIAGNOSIS — E1151 Type 2 diabetes mellitus with diabetic peripheral angiopathy without gangrene: Secondary | ICD-10-CM | POA: Diagnosis not present

## 2020-06-12 DIAGNOSIS — I10 Essential (primary) hypertension: Secondary | ICD-10-CM | POA: Diagnosis not present

## 2020-07-05 ENCOUNTER — Telehealth: Payer: Self-pay | Admitting: Physical Medicine and Rehabilitation

## 2020-07-05 NOTE — Telephone Encounter (Signed)
Patient called needing to schedule an appointment with Dr Alvester Morin for an injection.  The number to contact patient is [phone]

## 2020-07-05 NOTE — Telephone Encounter (Signed)
Okay 

## 2020-07-05 NOTE — Telephone Encounter (Signed)
Pt called wanting a repeat of both side hip inj, pt last inj was on 02/08/20, please advise

## 2020-07-06 ENCOUNTER — Telehealth: Payer: Self-pay | Admitting: Physical Medicine and Rehabilitation

## 2020-07-06 NOTE — Telephone Encounter (Signed)
Patient called. Returned call to [employer]. Need to call at 2:30 or after 4:30. He is at work. His call back number is [phone]

## 2020-07-06 NOTE — Telephone Encounter (Signed)
Called pt and LVM #1 

## 2020-07-10 NOTE — Telephone Encounter (Signed)
Called pt and LVM #2 for bilateral hip injections

## 2020-07-17 NOTE — Telephone Encounter (Signed)
Pt called and sch.

## 2020-07-19 ENCOUNTER — Telehealth: Payer: Self-pay

## 2020-07-19 NOTE — Telephone Encounter (Signed)
Patient called he stated he missed a call from our office did either of you call him? Call [phone]

## 2020-07-20 ENCOUNTER — Ambulatory Visit (INDEPENDENT_AMBULATORY_CARE_PROVIDER_SITE_OTHER): Payer: BC Managed Care – PPO | Admitting: Physical Medicine and Rehabilitation

## 2020-07-20 ENCOUNTER — Ambulatory Visit: Payer: Self-pay

## 2020-07-20 ENCOUNTER — Other Ambulatory Visit: Payer: Self-pay

## 2020-07-20 ENCOUNTER — Encounter: Payer: Self-pay | Admitting: Physical Medicine and Rehabilitation

## 2020-07-20 DIAGNOSIS — M25551 Pain in right hip: Secondary | ICD-10-CM | POA: Diagnosis not present

## 2020-07-20 DIAGNOSIS — M25552 Pain in left hip: Secondary | ICD-10-CM | POA: Diagnosis not present

## 2020-07-20 NOTE — Progress Notes (Signed)
[name] - 45 y.o. male MRN [id number]  Date of birth: [birth year]  Office Visit Note: Visit Date: 07/20/2020 PCP: Jarome Matin, MD Referred by: Jarome Matin, MD  Subjective: Chief Complaint  Patient presents with  . Left Hip - Pain  . Right Hip - Pain   HPI:  [name] is a 45 y.o. male who comes in today for planned repeat Bilateral anesthetic hip arthrogram with fluoroscopic guidance.  The patient has failed conservative care including home exercise, medications, time and activity modification. Prior injection gave more than 50% relief for several months. This injection will be diagnostic and hopefully therapeutic.  Please see requesting physician notes for further details and justification.  Referring: Dr. Doneen Poisson   ROS Otherwise per HPI.  Assessment & Plan: Visit Diagnoses:    ICD-10-CM   1. Pain in left hip  M25.552 XR C-ARM NO REPORT    Large Joint Inj: bilateral hip joint  2. Pain in right hip  M25.551 XR C-ARM NO REPORT    Large Joint Inj: bilateral hip joint    Plan: No additional findings.   Meds & Orders: No orders of the defined types were placed in this encounter.   Orders Placed This Encounter  Procedures  . Large Joint Inj: bilateral hip joint  . XR C-ARM NO REPORT    Follow-up: Return for visit to requesting physician as needed.   Procedures: Large Joint Inj: bilateral hip joint on 07/20/2020 2:37 PM Indications: diagnostic evaluation and pain Details: 22 G 3.5 in needle, fluoroscopy-guided anterior approach  Arthrogram: No  Medications (Right): 60 mg triamcinolone acetonide 40 MG/ML; 4 mL bupivacaine 0.25 % Medications (Left): 60 mg triamcinolone acetonide 40 MG/ML; 4 mL bupivacaine 0.25 % Outcome: tolerated well, no immediate complications  There was excellent flow of contrast producing a partial arthrogram of the hip. The patient did have relief of symptoms during the anesthetic phase of the injection. Procedure,  treatment alternatives, risks and benefits explained, specific risks discussed. Consent was given by the patient. Immediately prior to procedure a time out was called to verify the correct patient, procedure, equipment, support staff and site/side marked as required. Patient was prepped and draped in the usual sterile fashion.          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  Joint spacing looks fairly well maintained. There is some inferior  spurring on the right and there is a small area of ossification of the  superior lateral aspect of the acetabulum. There is no fractures or  dislocations. Sacroiliac joints appear well-maintained. Hip height seem to  be equal but there does appear to be some rotation with the left iliac  crest higher than the right. ----  MRI LUMBAR SPINE WITHOUT CONTRAST   Technique: Multiplanar and multiecho pulse sequences of the lumbar  spine were obtained without intravenous contrast.   Comparison: Plain films 06/17/2007   Findings: The sagittal MR images demonstrate normal overall  alignment of the lumbar vertebral bodies. They demonstrate normal  marrow signal. There is disc desiccation at L2-L3 and at L5-S1.  There are moderate degenerative changes for the patient's age. The  last full intervertebral the space is labeled L5-S1. This  correlates with the plain films. The conus medullaris terminates  at L1.   L1-L2: No significant findings   L2-L3: Broad-based shallow disc protrusion with flattening of the  anterior thecal sac. There is also a shallow right foraminal disc  protrusion with right foraminal encroachment.   L3-L4: Moderate sized central and right paracentral disc  protrusion with mass effect on the anterior thecal sac. Minimal  medial foraminal encroachment also   L4-L5: Moderate to large central disc  protrusion with significant  mass effect on the anterior thecal sac. There is also right  lateral recess stenosis and biforaminal stenosis, right greater  than left.   L5-S1: Moderate to large central and right paracentral disc  protrusion. There is mass effect on the anterior thecal sac.  There is also a moderate sized broad-based left foraminal disc  protrusion which contacts and compresses the left L5 nerve root.  Mild right foraminal stenosis.   IMPRESSION:   1. Multilevel disc disease with specific findings as discussed  above. There are significant disc protrusions at L3-L4, L4-L5 and  L5-S1.     Objective:  VS:  HT:    WT:   BMI:     BP:   HR: bpm  TEMP: ( )  RESP:  Physical Exam   Imaging: No results found.

## 2020-07-20 NOTE — Progress Notes (Signed)
Pt state pain in both hips. Pt state walking, standing and sitting  makes the pain worse. Pt state he takes pain meds to help ease his pain.  Numeric Pain Rating Scale and Functional Assessment Average Pain 4   In the last MONTH (on 0-10 scale) has pain interfered with the following?  1. General activity like being  able to carry out your everyday physical activities such as walking, climbing stairs, carrying groceries, or moving a chair?  Rating(8)   , -BT, -Dye Allergies.

## 2020-07-27 ENCOUNTER — Ambulatory Visit: Payer: BC Managed Care – PPO | Admitting: Physical Medicine and Rehabilitation

## 2020-08-03 DIAGNOSIS — E1140 Type 2 diabetes mellitus with diabetic neuropathy, unspecified: Secondary | ICD-10-CM | POA: Diagnosis not present

## 2020-08-07 MED ORDER — BUPIVACAINE HCL 0.25 % IJ SOLN
4.0000 mL | INTRAMUSCULAR | Status: AC | PRN
  Administered 2020-07-20: 4 mL via INTRA_ARTICULAR

## 2020-08-07 MED ORDER — TRIAMCINOLONE ACETONIDE 40 MG/ML IJ SUSP
60.0000 mg | INTRAMUSCULAR | Status: AC | PRN
  Administered 2020-07-20: 60 mg via INTRA_ARTICULAR

## 2020-08-10 DIAGNOSIS — J343 Hypertrophy of nasal turbinates: Secondary | ICD-10-CM | POA: Diagnosis not present

## 2020-08-10 DIAGNOSIS — J342 Deviated nasal septum: Secondary | ICD-10-CM | POA: Diagnosis not present

## 2020-08-10 DIAGNOSIS — J310 Chronic rhinitis: Secondary | ICD-10-CM | POA: Diagnosis not present

## 2020-08-11 ENCOUNTER — Other Ambulatory Visit: Payer: Self-pay | Admitting: Otolaryngology

## 2020-09-11 DIAGNOSIS — M4712 Other spondylosis with myelopathy, cervical region: Secondary | ICD-10-CM | POA: Diagnosis not present

## 2020-09-11 DIAGNOSIS — S129XXA Fracture of neck, unspecified, initial encounter: Secondary | ICD-10-CM | POA: Diagnosis not present

## 2020-10-10 ENCOUNTER — Telehealth: Payer: Self-pay | Admitting: Physical Medicine and Rehabilitation

## 2020-10-10 NOTE — Telephone Encounter (Signed)
Bilateral hip injections last done 07/20/2020. Ok to repeat if helped, same problem/side, and no new injury?

## 2020-10-10 NOTE — Telephone Encounter (Signed)
Pt is calling to get a hip injection with Dr.Newton.   CB [phone]

## 2020-10-11 ENCOUNTER — Ambulatory Visit: Admit: 2020-10-11 | Payer: BC Managed Care – PPO | Admitting: Otolaryngology

## 2020-10-11 SURGERY — SEPTOPLASTY, NOSE, WITH NASAL TURBINATE REDUCTION
Anesthesia: General

## 2020-10-11 NOTE — Telephone Encounter (Signed)
Left message #1

## 2020-10-13 DIAGNOSIS — I10 Essential (primary) hypertension: Secondary | ICD-10-CM | POA: Diagnosis not present

## 2020-10-13 DIAGNOSIS — E1140 Type 2 diabetes mellitus with diabetic neuropathy, unspecified: Secondary | ICD-10-CM | POA: Diagnosis not present

## 2020-10-26 ENCOUNTER — Encounter: Payer: Self-pay | Admitting: Physical Medicine and Rehabilitation

## 2020-10-26 ENCOUNTER — Ambulatory Visit: Payer: Self-pay

## 2020-10-26 ENCOUNTER — Other Ambulatory Visit: Payer: Self-pay

## 2020-10-26 ENCOUNTER — Ambulatory Visit: Payer: BC Managed Care – PPO | Admitting: Physical Medicine and Rehabilitation

## 2020-10-26 DIAGNOSIS — M25552 Pain in left hip: Secondary | ICD-10-CM

## 2020-10-26 DIAGNOSIS — M25551 Pain in right hip: Secondary | ICD-10-CM

## 2020-10-26 NOTE — Progress Notes (Signed)
[name] - 45 y.o. male MRN [id number]  Date of birth: Jun 14, 1975  Office Visit Note: Visit Date: 10/26/2020 PCP: Jarome Matin, MD Referred by: Jarome Matin, MD  Subjective: Chief Complaint  Patient presents with   Right Hip - Pain   Left Hip - Pain   HPI:  [name] is a 45 y.o. male who comes in today For planned repeat bilateral intra-articular hip injections.  She is history is well-known to me and is fully documented in the chart.  He does have end-stage issues with both hips particularly on the right.  Prior pinning of the hips is a young man do to dysplasia.  He was going to have the right hip replaced but this had to be postponed.  He still follow-up with Dr. Magnus Ivan for orthopedic follow-up.  ROS Otherwise per HPI.  Assessment & Plan: Visit Diagnoses:    ICD-10-CM   1. Pain in left hip  M25.552 Large Joint Inj: bilateral hip joint    XR C-ARM NO REPORT    2. Pain in right hip  M25.551 Large Joint Inj: bilateral hip joint    XR C-ARM NO REPORT      Plan: No additional findings.   Meds & Orders: No orders of the defined types were placed in this encounter.   Orders Placed This Encounter  Procedures   Large Joint Inj: bilateral hip joint   XR C-ARM NO REPORT    Follow-up: Return for visit to requesting physician as needed.   Procedures: Large Joint Inj: bilateral hip joint on 10/26/2020 10:04 AM Indications: diagnostic evaluation and pain Details: 22 G 3.5 in needle, fluoroscopy-guided anterior approach  Arthrogram: No  Medications (Right): 60 mg triamcinolone acetonide 40 MG/ML; 5 mL bupivacaine 0.5 % Medications (Left): 60 mg triamcinolone acetonide 40 MG/ML; 5 mL bupivacaine 0.5 % Outcome: tolerated well, no immediate complications  There was excellent flow of contrast producing a partial arthrogram of the hip. The patient did have relief of symptoms during the anesthetic phase of the injection. Procedure, treatment alternatives, risks and  benefits explained, specific risks discussed. Consent was given by the patient. Immediately prior to procedure a time out was called to verify the correct patient, procedure, equipment, support staff and site/side marked as required. Patient was prepped and draped in the usual sterile fashion.         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  Joint spacing looks fairly well maintained. There is some inferior  spurring on the right and there is a small area of ossification of the  superior lateral aspect of the acetabulum. There is no fractures or  dislocations. Sacroiliac joints appear well-maintained. Hip height seem to  be equal but there does appear to be some rotation with the left iliac  crest higher than the right. ----  MRI LUMBAR SPINE WITHOUT CONTRAST   Technique: Multiplanar and multiecho pulse sequences of the lumbar  spine were obtained without intravenous contrast.   Comparison: Plain films 06/17/2007   Findings: The sagittal MR images demonstrate normal overall  alignment of the lumbar vertebral bodies. They demonstrate normal  marrow signal. There is disc desiccation at L2-L3 and at L5-S1.  There are moderate degenerative changes for the patient's age. The  last full intervertebral the space is labeled L5-S1. This  correlates with the plain films. The conus medullaris terminates  at L1.  L1-L2: No significant findings   L2-L3: Broad-based shallow disc protrusion with flattening of the  anterior thecal sac. There is also a shallow right foraminal disc  protrusion with right foraminal encroachment.   L3-L4: Moderate sized central and right paracentral disc  protrusion with mass effect on the anterior thecal sac. Minimal  medial foraminal encroachment also   L4-L5: Moderate to large central disc protrusion with significant  mass effect  on the anterior thecal sac. There is also right  lateral recess stenosis and biforaminal stenosis, right greater  than left.   L5-S1: Moderate to large central and right paracentral disc  protrusion. There is mass effect on the anterior thecal sac.  There is also a moderate sized broad-based left foraminal disc  protrusion which contacts and compresses the left L5 nerve root.  Mild right foraminal stenosis.   IMPRESSION:   1. Multilevel disc disease with specific findings as discussed  above. There are significant disc protrusions at L3-L4, L4-L5 and  L5-S1.     Objective:  VS:  HT:    WT:   BMI:     BP:   HR: bpm  TEMP: ( )  RESP:  Physical Exam Constitutional:      Appearance: He is obese.  Musculoskeletal:     Comments: Positive groin pain with internal rotation of both hips.     Imaging: No results found.

## 2020-10-26 NOTE — Progress Notes (Signed)
Pt state pain in both hips. Pt state sitting, walking and standing makes the pain worse. Pt state he takes over the counter pain meds and uses ice to help ease his pain.  Numeric Pain Rating Scale and Functional Assessment Average Pain 7   In the last MONTH (on 0-10 scale) has pain interfered with the following?  1. General activity like being  able to carry out your everyday physical activities such as walking, climbing stairs, carrying groceries, or moving a chair?  Rating(10)   -BT, -Dye Allergies.

## 2020-10-30 ENCOUNTER — Encounter: Payer: Self-pay | Admitting: Adult Health

## 2020-10-30 ENCOUNTER — Ambulatory Visit: Payer: BC Managed Care – PPO | Admitting: Adult Health

## 2020-10-30 VITALS — BP 119/71 | HR 88 | Ht 66.0 in | Wt 286.2 lb

## 2020-10-30 DIAGNOSIS — G4733 Obstructive sleep apnea (adult) (pediatric): Secondary | ICD-10-CM

## 2020-10-30 DIAGNOSIS — Z9989 Dependence on other enabling machines and devices: Secondary | ICD-10-CM

## 2020-10-30 NOTE — Progress Notes (Addendum)
PATIENT: [name] DOB: 1975/11/08  REASON FOR VISIT: follow up HISTORY FROM: patient  HISTORY OF PRESENT ILLNESS: Today 10/30/20:  [name] is a 45 year old male with a history of obstructive sleep apnea on CPAP.  He returns today for follow-up.  He reports that the CPAP is working well for him.  He reports that there are some nights that he alternates between use and the nasal pillows and a full facemask.  He feels that the fullface mask is too small for him.  His download is below    10/28/19: [name] is a 45 year old male with a history of obstructive sleep apnea on CPAP.  His download indicates that he uses machine nightly for compliance of 100%.  He uses machine greater than 4 hours 28 days for compliance of 93%.  On average he uses his machine 6 hours and 2 minutes.  His residual AHI is 1.5 on 7 to 18 cm of water with EPR 3.  Leak in the 95th percentile is 4.1.  Reports that the CPAP is working well for him.  He returns today for follow-up.  HISTORY 10/22/18:   [name] is a 45 year old male with a history of obstructive sleep apnea on CPAP.  He returns today for follow-up.  His download indicates that he uses his machine nightly for compliance of 100%.  He uses his machine greater than 4 hours 28 days for compliance of 93%.  On average he uses his machine 6 hours and 9 minutes.  His residual AHI is 1.3 on 7 to 18 cm of water with EPR 3.  His leak in the 95th percentile is 6.8.  Overall he is doing well.  He denies any new issues with his machine.    REVIEW OF SYSTEMS: Out of a complete 14 system review of symptoms, the patient complains only of the following symptoms, and all other reviewed systems are negative.   ESS 9  ALLERGIES: Allergies  Allergen Reactions   Losartan Swelling and Rash    Patient says 'he's not 100% sure if it was losartan, but he thinks it is." He knows it was a blood pressure medicine.   Other Other (See Comments)    HOME  MEDICATIONS: Outpatient Medications Prior to Visit  Medication Sig Dispense Refill   amLODipine (NORVASC) 10 MG tablet Take 1 tablet (10 mg total) by mouth daily. 90 tablet 0   aspirin 81 MG EC tablet Take 1 tablet by mouth daily.     carvedilol (COREG) 6.25 MG tablet 2 (two) times daily with a meal.     clindamycin (CLEOCIN T) 1 % lotion Apply to affected areas daily     Continuous Blood Gluc Receiver (FREESTYLE LIBRE 14 DAY READER) DEVI use to monitor blood gluocse continuously     Continuous Blood Gluc Sensor (FREESTYLE LIBRE 14 DAY SENSOR) MISC Apply topically every 14 (fourteen) days.     doxycycline (VIBRAMYCIN) 100 MG capsule Take 1 capsule (100 mg total) by mouth 2 (two) times daily. 20 capsule 0   Dulaglutide (TRULICITY) 1.5 MG/0.5ML SOPN See admin instructions.     escitalopram (LEXAPRO) 20 MG tablet Take 20 mg by mouth daily.     gabapentin (NEURONTIN) 300 MG capsule Take 300 mg by mouth 2 (two) times daily.     HUMALOG KWIKPEN 100 UNIT/ML KwikPen Inject 10-15 Units into the skin 3 (three) times daily before meals.      Insulin Glargine (TOUJEO SOLOSTAR Payne) Inject 54 Units into the skin 2 (  two) times daily.      Insulin Pen Needle (BD PEN NEEDLE NANO U/F) 32G X 4 MM MISC Use with insulin pens 4 times daily     metFORMIN (GLUCOPHAGE-XR) 500 MG 24 hr tablet Take 2 tablets (1,000 mg total) by mouth 2 (two) times daily with a meal. 120 tablet 2   propranolol (INDERAL) 10 MG tablet Take 10 mg by mouth 3 (three) times daily.     cephALEXin (KEFLEX) 500 MG capsule Take 500 mg by mouth 4 (four) times daily.     No facility-administered medications prior to visit.    PAST MEDICAL HISTORY: Past Medical History:  Diagnosis Date   Acute pancreatitis 07/2010   Anxiety    Arthritis    "lower back and shoulders" (10/07/2017)   Chronic lower back pain    Dyslipidemia    Headache    "weekly" (10/07/2017)   Hypercholesteremia    Hypertension    MRSA (methicillin resistant staph aureus)  culture positive    Obesity    Obesity    OSA on CPAP    Peripheral neuropathy    Scoliosis    Sleep apnea    "can't tolerate any of the masks" (10/07/2017)   Sleep apnea    Stroke (HCC)    stroke-like 2.5 years ago   TIA (transient ischemic attack)    Type II diabetes mellitus (HCC)    Umbilical hernia     PAST SURGICAL HISTORY: Past Surgical History:  Procedure Laterality Date   HIP PINNING Bilateral    "they are worn out" (10/07/2017)   PICC LINE INSERTION  07/2010   "in hospital for ~ 2 months; took PICC out when I was discharged"   POSTERIOR CERVICAL FUSION/FORAMINOTOMY N/A 05/26/2019   Procedure: POSTERIOR CERVICAL FUSION WITH LATERAL MASS FIXATION CERVICAL THREE- THORACIC ONE;  Surgeon: Bedelia Person, MD;  Location: [facility] OR;  Service: Neurosurgery;  Laterality: N/A;  posterior, Cervical/Thoracic   TESTICLE REMOVAL Right ~ 2012   "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"    FAMILY HISTORY: Family History  Problem Relation Age of Onset   Hypertension Mother    Hypertension Father    Sleep apnea Neg Hx     SOCIAL HISTORY: Social History   Socioeconomic History   Marital status: Legally Separated    Spouse name: Not on file   Number of children: Not on file   Years of education: Not on file   Highest education level: Not on file  Occupational History   Not on file  Tobacco Use   Smoking status: Former    Packs/day: 0.50    Years: 20.00    Pack years: 10.00    Types: Cigarettes   Smokeless tobacco: Former    Types: Snuff   Tobacco comments:    10/07/2017 "quit years ago"  Vaping Use   Vaping Use: Never used  Substance and Sexual Activity   Alcohol use: Not Currently   Drug use: No   Sexual activity: Yes  Other Topics Concern   Not on file  Social History Narrative   Not on file   Social Determinants of Health   Financial Resource Strain: Not on file  Food Insecurity: Not on file  Transportation Needs: Not on file  Physical  Activity: Not on file  Stress: Not on file  Social Connections: Not on file  Intimate Partner Violence: Not on file      PHYSICAL EXAM  Vitals:   10/30/20 1405  BP:  119/71  Pulse: 88  Weight: 286 lb 3.2 oz (129.8 kg)  Height: 5\' 6"  (1.676 m)   Body mass index is 46.19 kg/m.  Generalized: Well developed, in no acute distress  Chest: Lungs clear to auscultation bilaterally  Neurological examination  Mentation: Alert oriented to time, place, history taking. Follows all commands speech and language fluent Cranial nerve II-XII: Extraocular movements were full, visual field were full on confrontational test Head turning and shoulder shrug  were normal and symmetric. Motor: The motor testing reveals 5 over 5 strength of all 4 extremities. Good symmetric motor tone is noted throughout.  Sensory: Sensory testing is intact to soft touch on all 4 extremities. No evidence of extinction is noted.  Gait and station: Gait is normal.    DIAGNOSTIC DATA (LABS, IMAGING, TESTING) - I reviewed patient records, labs, notes, testing and imaging myself where available.  Lab Results  Component Value Date   WBC 7.6 05/24/2019   HGB 15.6 05/24/2019   HCT 48.2 05/24/2019   MCV 97.4 05/24/2019   PLT 107 (L) 05/24/2019      Component Value Date/Time   NA 140 05/24/2019 0842   K 4.2 05/24/2019 0842   CL 106 05/24/2019 0842   CO2 27 05/24/2019 0842   GLUCOSE 120 (H) 05/24/2019 0842   BUN 17 05/24/2019 0842   CREATININE 0.73 05/24/2019 0842   CALCIUM 8.2 (L) 05/24/2019 0842   PROT 6.5 10/28/2016 0305   ALBUMIN 3.6 10/28/2016 0305   AST 17 10/28/2016 0305   ALT 14 (L) 10/28/2016 0305   ALKPHOS 73 10/28/2016 0305   BILITOT 0.7 10/28/2016 0305   GFRNONAA >60 05/24/2019 0842   GFRAA >60 05/24/2019 0842   Lab Results  Component Value Date   CHOL 254 (H) 10/07/2017   HDL 36 (L) 10/07/2017   LDLCALC UNABLE TO CALCULATE IF TRIGLYCERIDE OVER 400 mg/dL 12/07/2017   LDLDIRECT 81 10/07/2017    TRIG 775 (H) 10/07/2017   CHOLHDL 7.1 10/07/2017   Lab Results  Component Value Date   HGBA1C 7.4 (H) 10/28/2019   No results found for: VITAMINB12 Lab Results  Component Value Date   TSH 1.114 07/08/2010      ASSESSMENT AND PLAN 46 y.o. year old male  has a past medical history of Acute pancreatitis (07/2010), Anxiety, Arthritis, Chronic lower back pain, Dyslipidemia, Headache, Hypercholesteremia, Hypertension, MRSA (methicillin resistant staph aureus) culture positive, Obesity, Obesity, OSA on CPAP, Peripheral neuropathy, Scoliosis, Sleep apnea, Sleep apnea, Stroke (HCC), TIA (transient ischemic attack), Type II diabetes mellitus (HCC), and Umbilical hernia. here with:  OSA on CPAP  - CPAP compliance excellent - Good treatment of AHI  - Encourage patient to use CPAP nightly and > 4 hours each night -Order sent for new supplies and larger mask size - F/U in 1 year or sooner if needed   08/2010, MSN, NP-C 10/30/2020, 2:35 PM [facility] [address] [place], [place] [phone]

## 2020-10-30 NOTE — Patient Instructions (Signed)
Continue using CPAP nightly and greater than 4 hours each night °If your symptoms worsen or you develop new symptoms please let us know.  ° °

## 2020-11-09 MED ORDER — BUPIVACAINE HCL 0.5 % IJ SOLN
5.0000 mL | INTRAMUSCULAR | Status: AC | PRN
  Administered 2020-10-26: 5 mL via INTRA_ARTICULAR

## 2020-11-09 MED ORDER — TRIAMCINOLONE ACETONIDE 40 MG/ML IJ SUSP
60.0000 mg | INTRAMUSCULAR | Status: AC | PRN
  Administered 2020-10-26: 60 mg via INTRA_ARTICULAR

## 2021-01-22 ENCOUNTER — Ambulatory Visit (INDEPENDENT_AMBULATORY_CARE_PROVIDER_SITE_OTHER): Payer: BC Managed Care – PPO | Admitting: Orthopaedic Surgery

## 2021-01-22 ENCOUNTER — Other Ambulatory Visit: Payer: Self-pay

## 2021-01-22 VITALS — Ht 66.0 in | Wt 286.0 lb

## 2021-01-22 DIAGNOSIS — M1611 Unilateral primary osteoarthritis, right hip: Secondary | ICD-10-CM

## 2021-01-22 DIAGNOSIS — Z969 Presence of functional implant, unspecified: Secondary | ICD-10-CM

## 2021-01-22 DIAGNOSIS — M1612 Unilateral primary osteoarthritis, left hip: Secondary | ICD-10-CM

## 2021-01-22 DIAGNOSIS — M160 Bilateral primary osteoarthritis of hip: Secondary | ICD-10-CM | POA: Diagnosis not present

## 2021-01-22 MED ORDER — ACETAMINOPHEN-CODEINE #3 300-30 MG PO TABS: 1.0000 | ORAL_TABLET | Freq: Three times a day (TID) | ORAL | 0 refills | Status: DC | PRN

## 2021-01-22 MED ORDER — TIZANIDINE HCL 4 MG PO TABS: 4.0000 mg | ORAL_TABLET | Freq: Three times a day (TID) | ORAL | 1 refills | Status: DC | PRN

## 2021-01-22 NOTE — Progress Notes (Signed)
The patient is well-known to me.  He is only 45 years old but has a history of slipped capital femoral epiphysis with both of his hips pinned over 30 years ago.  He has severe end-stage arthritis now both his hips.  He is scheduled for a right total hip arthroplasty and hardware removal on March 13, 2021.  He has recently had a steroid injection in his left hip.  That is really not helped much.  He is someone who is morbidly obese with a BMI of 46.16.  He is also diabetic and he reports that his last hemoglobin A1c was below 7.  He walks with a Trendelenburg gait and has quite severe pain in both his hips with the right hip being the one we are pursuing surgery on in early January.  He has had no other acute change in medical status.  Both hips are quite stiff with any attempts of rotation and both hurt quite a bit.  I did have him lay in a supine position on the exam table and I am able to easily mobilize his abdomen out of the surgical field to be able to get a better shot to his hip.  We had a long and thorough discussion again about hip replacement surgery.  I far showed him his x-rays in the past and we went over them again.  I described in detail how difficult the surgery will be in terms of removing the hardware through 1 incision and then replacing his hip through another incision.  He has a significant heightened risk for infection, fracture, nerve and vessel injury, DVT, implant failure, and skin and soft tissue issues.  He understands her goals reduce pain, improve mobility, and improve quality of life.  I will send in some Tylenol 3 and Zanaflex in the interim.  I have recommended he use a cane in his opposite hand until he proceed with surgery in early January.  All questions and concerns were answered and addressed.

## 2021-02-07 DIAGNOSIS — G4733 Obstructive sleep apnea (adult) (pediatric): Secondary | ICD-10-CM | POA: Diagnosis not present

## 2021-02-15 ENCOUNTER — Other Ambulatory Visit: Payer: Self-pay | Admitting: Physician Assistant

## 2021-02-15 DIAGNOSIS — M1611 Unilateral primary osteoarthritis, right hip: Secondary | ICD-10-CM

## 2021-03-08 NOTE — Progress Notes (Signed)
Surgical Instructions    Your procedure is scheduled on 03/13/21.  Report to [facility] Main Entrance "A" at 12:30 A.M., then check in with the Admitting office.  Call this number if you have problems the morning of surgery:  [phone]   If you have any questions prior to your surgery date call [phone]: Open Monday-Friday 8am-4pm    Remember:  Do not eat after midnight the night before your surgery  You may drink clear liquids until 11:30 the morning of your surgery.   Clear liquids allowed are: Water, Non-Citrus Juices (without pulp), Carbonated Beverages, Clear Tea, Black Coffee ONLY (NO MILK, CREAM OR POWDERED CREAMER of any kind), and Gatorade  Please complete your PRE-SURGERY GATRADE that was provided to you by 11:30 the morning of surgery.  Please, if able, drink it in one setting. DO NOT SIP.     Take these medicines the morning of surgery with A SIP OF WATER: amLODipine (NORVASC) carvedilol (COREG)  escitalopram (LEXAPRO) gabapentin (NEURONTIN) propranolol (INDERAL) doxycycline (VIBRAMYCIN) - as needed   As of today, STOP taking any Aspirin (unless otherwise instructed by your surgeon) Aleve, Naproxen, Ibuprofen, Motrin, Advil, Goody's, BC's, all herbal medications, fish oil, and all vitamins.  WHAT DO I DO ABOUT MY DIABETES MEDICATION?   Do not take oral diabetes medicines (pills) the morning of surgery. DO NOT take Metformin day of surgery.  THE NIGHT BEFORE SURGERY, take __50%_________ units of ____Toujeo_______insulin.       THE MORNING OF SURGERY, take _______50%______ units of _____Toujeo_____insulin.  The day of surgery, do not take other diabetes injectables, including Byetta (exenatide), Bydureon (exenatide ER), Victoza (liraglutide), or Trulicity (dulaglutide).  If your CBG is greater than 220 mg/dL, you may take  of your sliding scale (correction) dose of insulin.   HOW TO MANAGE YOUR DIABETES BEFORE AND AFTER SURGERY  Why is it important to  control my blood sugar before and after surgery? Improving blood sugar levels before and after surgery helps healing and can limit problems. A way of improving blood sugar control is eating a healthy diet by:  Eating less sugar and carbohydrates  Increasing activity/exercise  Talking with your doctor about reaching your blood sugar goals High blood sugars (greater than 180 mg/dL) can raise your risk of infections and slow your recovery, so you will need to focus on controlling your diabetes during the weeks before surgery. Make sure that the doctor who takes care of your diabetes knows about your planned surgery including the date and location.  How do I manage my blood sugar before surgery? Check your blood sugar at least 4 times a day, starting 2 days before surgery, to make sure that the level is not too high or low.  Check your blood sugar the morning of your surgery when you wake up and every 2 hours until you get to the Short Stay unit.  If your blood sugar is less than 70 mg/dL, you will need to treat for low blood sugar: Do not take insulin. Treat a low blood sugar (less than 70 mg/dL) with  cup of clear juice (cranberry or apple), 4 glucose tablets, OR glucose gel. Recheck blood sugar in 15 minutes after treatment (to make sure it is greater than 70 mg/dL). If your blood sugar is not greater than 70 mg/dL on recheck, call [phone] for further instructions. Report your blood sugar to the short stay nurse when you get to Short Stay.  If you are admitted to the hospital after surgery:  Your blood sugar will be checked by the staff and you will probably be given insulin after surgery (instead of oral diabetes medicines) to make sure you have good blood sugar levels. The goal for blood sugar control after surgery is 80-180 mg/dL.    After your COVID test   You are not required to quarantine however you are required to wear a well-fitting mask when you are out and around people not  in your household.  If your mask becomes wet or soiled, replace with a new one.  Wash your hands often with soap and water for 20 seconds or clean your hands with an alcohol-based hand sanitizer that contains at least 60% alcohol.  Do not share personal items.  Notify your provider: if you are in close contact with someone who has COVID  or if you develop a fever of 100.4 or greater, sneezing, cough, sore throat, shortness of breath or body aches.           Do not wear jewelry  Do not wear lotions, powders, colognes, or deodorant. Do not shave 48 hours prior to surgery.  Men may shave face and neck. Do not bring valuables to the hospital.              [facility] is not responsible for any belongings or valuables.  Do NOT Smoke (Tobacco/Vaping)  24 hours prior to your procedure  If you use a CPAP at night, you may bring your mask for your overnight stay.   Contacts, glasses, hearing aids, dentures or partials may not be worn into surgery, please bring cases for these belongings   For patients admitted to the hospital, discharge time will be determined by your treatment team.   Patients discharged the day of surgery will not be allowed to drive home, and someone needs to stay with them for 24 hours.  NO VISITORS WILL BE ALLOWED IN PRE-OP WHERE PATIENTS ARE PREPPED FOR SURGERY.  ONLY 1 SUPPORT PERSON MAY BE PRESENT IN THE WAITING ROOM WHILE YOU ARE IN SURGERY.  IF YOU ARE TO BE ADMITTED, ONCE YOU ARE IN YOUR ROOM YOU WILL BE ALLOWED TWO (2) VISITORS. 1 (ONE) VISITOR MAY STAY OVERNIGHT BUT MUST ARRIVE TO THE ROOM BY 8pm.  Minor children may have two parents present. Special consideration for safety and communication needs will be reviewed on a case by case basis.  Special instructions:    Oral Hygiene is also important to reduce your risk of infection.  Remember - BRUSH YOUR TEETH THE MORNING OF SURGERY WITH YOUR REGULAR TOOTHPASTE   [facility]- Preparing For Surgery  Before  surgery, you can play an important role. Because skin is not sterile, your skin needs to be as free of germs as possible. You can reduce the number of germs on your skin by washing with CHG (chlorahexidine gluconate) Soap before surgery.  CHG is an antiseptic cleaner which kills germs and bonds with the skin to continue killing germs even after washing.     Please do not use if you have an allergy to CHG or antibacterial soaps. If your skin becomes reddened/irritated stop using the CHG.  Do not shave (including legs and underarms) for at least 48 hours prior to first CHG shower. It is OK to shave your face.  Please follow these instructions carefully.     Shower the NIGHT BEFORE SURGERY and the MORNING OF SURGERY with CHG Soap.   If you chose to wash your hair, wash your  hair first as usual with your normal shampoo. After you shampoo, rinse your hair and body thoroughly to remove the shampoo.  Then [employer] and genitals (private parts) with your normal soap and rinse thoroughly to remove soap.  After that Use CHG Soap as you would any other liquid soap. You can apply CHG directly to the skin and wash gently with a scrungie or a clean washcloth.   Apply the CHG Soap to your body ONLY FROM THE NECK DOWN.  Do not use on open wounds or open sores. Avoid contact with your eyes, ears, mouth and genitals (private parts). Wash Face and genitals (private parts)  with your normal soap.   Wash thoroughly, paying special attention to the area where your surgery will be performed.  Thoroughly rinse your body with warm water from the neck down.  DO NOT shower/wash with your normal soap after using and rinsing off the CHG Soap.  Pat yourself dry with a CLEAN TOWEL.  Wear CLEAN PAJAMAS to bed the night before surgery  Place CLEAN SHEETS on your bed the night before your surgery  DO NOT SLEEP WITH PETS.   Day of Surgery:  Take a shower with CHG soap. Wear Clean/Comfortable clothing the morning of  surgery Do not apply any deodorants/lotions.   Remember to brush your teeth WITH YOUR REGULAR TOOTHPASTE.   Please read over the following fact sheets that you were given.

## 2021-03-09 ENCOUNTER — Encounter (HOSPITAL_COMMUNITY)
Admission: RE | Admit: 2021-03-09 | Discharge: 2021-03-09 | Disposition: A | Discharge status: Home / Self Care | Payer: BC Managed Care – PPO | Source: Ambulatory Visit | Attending: Orthopaedic Surgery | Admitting: Orthopaedic Surgery

## 2021-03-09 ENCOUNTER — Encounter (HOSPITAL_COMMUNITY): Payer: Self-pay

## 2021-03-09 ENCOUNTER — Telehealth: Payer: Self-pay | Admitting: Orthopaedic Surgery

## 2021-03-09 ENCOUNTER — Other Ambulatory Visit: Payer: Self-pay

## 2021-03-09 VITALS — BP 132/78 | HR 83 | Temp 98.8°F | Resp 18 | Ht 66.0 in | Wt 290.9 lb

## 2021-03-09 DIAGNOSIS — Z794 Long term (current) use of insulin: Secondary | ICD-10-CM | POA: Diagnosis not present

## 2021-03-09 DIAGNOSIS — Z20822 Contact with and (suspected) exposure to covid-19: Secondary | ICD-10-CM | POA: Insufficient documentation

## 2021-03-09 DIAGNOSIS — Z01818 Encounter for other preprocedural examination: Secondary | ICD-10-CM | POA: Diagnosis not present

## 2021-03-09 DIAGNOSIS — E119 Type 2 diabetes mellitus without complications: Secondary | ICD-10-CM | POA: Diagnosis not present

## 2021-03-09 HISTORY — DX: Dyspnea, unspecified: R06.00

## 2021-03-09 LAB — BASIC METABOLIC PANEL
Anion gap: 6 (ref 5–15)
BUN: 14 mg/dL (ref 6–20)
CO2: 27 mmol/L (ref 22–32)
Calcium: 8.6 mg/dL — ABNORMAL LOW (ref 8.9–10.3)
Chloride: 104 mmol/L (ref 98–111)
Creatinine, Ser: 0.82 mg/dL (ref 0.61–1.24)
GFR, Estimated: >60 mL/min (ref >60)
Glucose, Bld: 156 mg/dL — ABNORMAL HIGH (ref 70–99)
Potassium: 4 mmol/L (ref 3.5–5.1)
Sodium: 137 mmol/L (ref 135–145)

## 2021-03-09 LAB — TYPE AND SCREEN
ABO/RH(D): A POS
Antibody Screen: NEGATIVE

## 2021-03-09 LAB — CBC
HCT: 40.1 % (ref 39.0–52.0)
Hemoglobin: 13.3 g/dL (ref 13.0–17.0)
MCH: 31 pg (ref 26.0–34.0)
MCHC: 33.2 g/dL (ref 30.0–36.0)
MCV: 93.5 fL (ref 80.0–100.0)
Platelets: 126 10*3/uL — ABNORMAL LOW (ref 150–400)
RBC: 4.29 MIL/uL (ref 4.22–5.81)
RDW: 13 % (ref 11.5–15.5)
WBC: 8 10*3/uL (ref 4.0–10.5)
nRBC: 0 % (ref 0.0–0.2)

## 2021-03-09 LAB — SURGICAL PCR SCREEN
MRSA, PCR: NEGATIVE
Staphylococcus aureus: NEGATIVE

## 2021-03-09 LAB — GLUCOSE, CAPILLARY: Glucose-Capillary: 161 mg/dL — ABNORMAL HIGH (ref 70–99)

## 2021-03-09 LAB — HEMOGLOBIN A1C
Hgb A1c MFr Bld: 7 % — ABNORMAL HIGH (ref 4.8–5.6)
Mean Plasma Glucose: 154.2 mg/dL

## 2021-03-09 NOTE — Telephone Encounter (Signed)
Pt submitted medical release form, short term disability form, and $25.00 cash payment to [employer]. Accepted 03/09/2021

## 2021-03-09 NOTE — Progress Notes (Addendum)
PCP: Leanna Battles, MD Cardiologist: denies  EKG: 03/09/21 CXR: 07/27/10 ECHO: 10/07/17 Stress Test: denies Cardiac Cath: denies  Fasting Blood Sugar- 110-150 Checks Blood Sugar__2_ times a day BG 161 at PAT  ASA: Patient to call surgeon for instructions Blood Thinner: No  OSA/CPAP: Yes, wears nightly  Covid test 03/09/21 at PAT  Anesthesia Review: Yes, abnormal EKG  Patient denies shortness of breath, fever, cough, and chest pain at PAT appointment.  Patient verbalized understanding of instructions provided today at the PAT appointment.  Patient asked to review instructions at home and day of surgery.

## 2021-03-10 DIAGNOSIS — G4733 Obstructive sleep apnea (adult) (pediatric): Secondary | ICD-10-CM | POA: Diagnosis not present

## 2021-03-10 LAB — SARS CORONAVIRUS 2 (TAT 6-24 HRS): SARS Coronavirus 2: NEGATIVE

## 2021-03-13 ENCOUNTER — Inpatient Hospital Stay (HOSPITAL_COMMUNITY)
Admission: RE | Admit: 2021-03-13 | Discharge: 2021-03-17 | DRG: 470 | Disposition: A | Discharge status: Home / Self Care | Payer: BC Managed Care – PPO | Attending: Orthopaedic Surgery | Admitting: Orthopaedic Surgery

## 2021-03-13 ENCOUNTER — Observation Stay (HOSPITAL_COMMUNITY): Payer: BC Managed Care – PPO

## 2021-03-13 ENCOUNTER — Ambulatory Visit (HOSPITAL_COMMUNITY): Payer: BC Managed Care – PPO | Admitting: Physician Assistant

## 2021-03-13 ENCOUNTER — Ambulatory Visit (HOSPITAL_COMMUNITY): Payer: BC Managed Care – PPO

## 2021-03-13 ENCOUNTER — Other Ambulatory Visit: Payer: Self-pay

## 2021-03-13 ENCOUNTER — Encounter (HOSPITAL_COMMUNITY): Payer: Self-pay | Admitting: Orthopaedic Surgery

## 2021-03-13 ENCOUNTER — Encounter (HOSPITAL_COMMUNITY)
Admission: RE | Disposition: A | Discharge status: Home / Self Care | Payer: Self-pay | Source: Home / Self Care | Attending: Orthopaedic Surgery

## 2021-03-13 ENCOUNTER — Ambulatory Visit (HOSPITAL_COMMUNITY): Payer: BC Managed Care – PPO | Admitting: Anesthesiology

## 2021-03-13 DIAGNOSIS — Z472 Encounter for removal of internal fixation device: Secondary | ICD-10-CM | POA: Diagnosis not present

## 2021-03-13 DIAGNOSIS — Z86718 Personal history of other venous thrombosis and embolism: Secondary | ICD-10-CM

## 2021-03-13 DIAGNOSIS — Z8249 Family history of ischemic heart disease and other diseases of the circulatory system: Secondary | ICD-10-CM

## 2021-03-13 DIAGNOSIS — J3089 Other allergic rhinitis: Secondary | ICD-10-CM | POA: Diagnosis present

## 2021-03-13 DIAGNOSIS — Z8673 Personal history of transient ischemic attack (TIA), and cerebral infarction without residual deficits: Secondary | ICD-10-CM

## 2021-03-13 DIAGNOSIS — Z888 Allergy status to other drugs, medicaments and biological substances status: Secondary | ICD-10-CM

## 2021-03-13 DIAGNOSIS — Z9079 Acquired absence of other genital organ(s): Secondary | ICD-10-CM

## 2021-03-13 DIAGNOSIS — Z6841 Body Mass Index (BMI) 40.0 and over, adult: Secondary | ICD-10-CM | POA: Diagnosis not present

## 2021-03-13 DIAGNOSIS — Z8614 Personal history of Methicillin resistant Staphylococcus aureus infection: Secondary | ICD-10-CM | POA: Diagnosis not present

## 2021-03-13 DIAGNOSIS — E7800 Pure hypercholesterolemia, unspecified: Secondary | ICD-10-CM | POA: Diagnosis present

## 2021-03-13 DIAGNOSIS — M1611 Unilateral primary osteoarthritis, right hip: Secondary | ICD-10-CM | POA: Diagnosis not present

## 2021-03-13 DIAGNOSIS — Z87891 Personal history of nicotine dependence: Secondary | ICD-10-CM

## 2021-03-13 DIAGNOSIS — Z471 Aftercare following joint replacement surgery: Secondary | ICD-10-CM | POA: Diagnosis not present

## 2021-03-13 DIAGNOSIS — G8929 Other chronic pain: Secondary | ICD-10-CM | POA: Diagnosis not present

## 2021-03-13 DIAGNOSIS — Z9989 Dependence on other enabling machines and devices: Secondary | ICD-10-CM | POA: Diagnosis not present

## 2021-03-13 DIAGNOSIS — Z419 Encounter for procedure for purposes other than remedying health state, unspecified: Secondary | ICD-10-CM

## 2021-03-13 DIAGNOSIS — I10 Essential (primary) hypertension: Secondary | ICD-10-CM | POA: Diagnosis not present

## 2021-03-13 DIAGNOSIS — G4700 Insomnia, unspecified: Secondary | ICD-10-CM | POA: Diagnosis present

## 2021-03-13 DIAGNOSIS — R531 Weakness: Secondary | ICD-10-CM | POA: Diagnosis not present

## 2021-03-13 DIAGNOSIS — G4733 Obstructive sleep apnea (adult) (pediatric): Secondary | ICD-10-CM | POA: Diagnosis present

## 2021-03-13 DIAGNOSIS — Z981 Arthrodesis status: Secondary | ICD-10-CM

## 2021-03-13 DIAGNOSIS — Z96641 Presence of right artificial hip joint: Secondary | ICD-10-CM | POA: Diagnosis not present

## 2021-03-13 DIAGNOSIS — E1142 Type 2 diabetes mellitus with diabetic polyneuropathy: Secondary | ICD-10-CM | POA: Diagnosis not present

## 2021-03-13 HISTORY — PX: HARDWARE REMOVAL: SHX979

## 2021-03-13 HISTORY — PX: TOTAL HIP ARTHROPLASTY: SHX124

## 2021-03-13 LAB — GLUCOSE, CAPILLARY
Glucose-Capillary: 137 mg/dL — ABNORMAL HIGH (ref 70–99)
Glucose-Capillary: 171 mg/dL — ABNORMAL HIGH (ref 70–99)
Glucose-Capillary: 177 mg/dL — ABNORMAL HIGH (ref 70–99)

## 2021-03-13 SURGERY — ARTHROPLASTY, HIP, TOTAL, ANTERIOR APPROACH
Anesthesia: Spinal | Site: Hip | Laterality: Right

## 2021-03-13 MED ORDER — HYDROMORPHONE HCL 1 MG/ML IJ SOLN
0.2500 mg | INTRAMUSCULAR | Status: DC | PRN
  Administered 2021-03-13 (×4): 0.5 mg via INTRAVENOUS

## 2021-03-13 MED ORDER — ONDANSETRON HCL 4 MG/2ML IJ SOLN
INTRAMUSCULAR | Status: DC | PRN
  Administered 2021-03-13: 4 mg via INTRAVENOUS

## 2021-03-13 MED ORDER — HYDROMORPHONE HCL 1 MG/ML IJ SOLN
0.5000 mg | INTRAMUSCULAR | Status: DC | PRN
  Administered 2021-03-13 – 2021-03-16 (×11): 1 mg via INTRAVENOUS
  Filled 2021-03-13 (×11): qty 1

## 2021-03-13 MED ORDER — INSULIN GLARGINE-YFGN 100 UNIT/ML ~~LOC~~ SOLN
60.0000 [IU] | Freq: Two times a day (BID) | SUBCUTANEOUS | Status: DC
  Administered 2021-03-13: 60 [IU] via SUBCUTANEOUS
  Filled 2021-03-13 (×3): qty 0.6

## 2021-03-13 MED ORDER — METFORMIN HCL ER 500 MG PO TB24
1000.0000 mg | ORAL_TABLET | Freq: Two times a day (BID) | ORAL | Status: DC
  Administered 2021-03-14 – 2021-03-17 (×7): 1000 mg via ORAL
  Filled 2021-03-13 (×7): qty 2

## 2021-03-13 MED ORDER — INSULIN GLARGINE (1 UNIT DIAL) 300 UNIT/ML ~~LOC~~ SOPN: 60.0000 [IU] | PEN_INJECTOR | Freq: Two times a day (BID) | SUBCUTANEOUS | Status: DC

## 2021-03-13 MED ORDER — DIPHENHYDRAMINE HCL 12.5 MG/5ML PO ELIX: 12.5000 mg | ORAL_SOLUTION | ORAL | Status: DC | PRN

## 2021-03-13 MED ORDER — CEFAZOLIN SODIUM 1 G IJ SOLR
INTRAMUSCULAR | Status: AC
  Filled 2021-03-13: qty 30

## 2021-03-13 MED ORDER — AMISULPRIDE (ANTIEMETIC) 5 MG/2ML IV SOLN: 10.0000 mg | Freq: Once | INTRAVENOUS | Status: DC | PRN

## 2021-03-13 MED ORDER — METHOCARBAMOL 1000 MG/10ML IJ SOLN
500.0000 mg | Freq: Four times a day (QID) | INTRAVENOUS | Status: DC | PRN
  Filled 2021-03-13: qty 5

## 2021-03-13 MED ORDER — OXYCODONE HCL 5 MG/5ML PO SOLN: 5.0000 mg | Freq: Once | ORAL | Status: AC | PRN

## 2021-03-13 MED ORDER — ORAL CARE MOUTH RINSE: 15.0000 mL | Freq: Once | OROMUCOSAL | Status: AC

## 2021-03-13 MED ORDER — ALUM & MAG HYDROXIDE-SIMETH 200-200-20 MG/5ML PO SUSP: 30.0000 mL | ORAL | Status: DC | PRN

## 2021-03-13 MED ORDER — SODIUM CHLORIDE 0.9 % IV SOLN: INTRAVENOUS | Status: DC

## 2021-03-13 MED ORDER — ONDANSETRON HCL 4 MG PO TABS: 4.0000 mg | ORAL_TABLET | Freq: Four times a day (QID) | ORAL | Status: DC | PRN

## 2021-03-13 MED ORDER — HYDROMORPHONE HCL 1 MG/ML IJ SOLN
INTRAMUSCULAR | Status: AC
  Filled 2021-03-13: qty 1

## 2021-03-13 MED ORDER — ONDANSETRON HCL 4 MG/2ML IJ SOLN
INTRAMUSCULAR | Status: AC
  Filled 2021-03-13: qty 2

## 2021-03-13 MED ORDER — DOCUSATE SODIUM 100 MG PO CAPS
100.0000 mg | ORAL_CAPSULE | Freq: Two times a day (BID) | ORAL | Status: DC
  Administered 2021-03-13 – 2021-03-17 (×8): 100 mg via ORAL
  Filled 2021-03-13 (×8): qty 1

## 2021-03-13 MED ORDER — INSULIN LISPRO (1 UNIT DIAL) 100 UNIT/ML (KWIKPEN): 15.0000 [IU] | PEN_INJECTOR | Freq: Three times a day (TID) | SUBCUTANEOUS | Status: DC

## 2021-03-13 MED ORDER — PROPRANOLOL HCL 10 MG PO TABS
10.0000 mg | ORAL_TABLET | Freq: Three times a day (TID) | ORAL | Status: DC
  Administered 2021-03-14 – 2021-03-17 (×10): 10 mg via ORAL
  Filled 2021-03-13 (×12): qty 1

## 2021-03-13 MED ORDER — KETOROLAC TROMETHAMINE 30 MG/ML IJ SOLN
30.0000 mg | Freq: Once | INTRAMUSCULAR | Status: AC
  Administered 2021-03-13: 30 mg via INTRAVENOUS

## 2021-03-13 MED ORDER — DULAGLUTIDE 1.5 MG/0.5ML ~~LOC~~ SOAJ: 1.5000 mg | SUBCUTANEOUS | Status: DC

## 2021-03-13 MED ORDER — OXYCODONE HCL 5 MG PO TABS
5.0000 mg | ORAL_TABLET | ORAL | Status: DC | PRN
  Administered 2021-03-15 – 2021-03-16 (×3): 10 mg via ORAL
  Filled 2021-03-13: qty 1
  Filled 2021-03-13: qty 2
  Filled 2021-03-13: qty 1
  Filled 2021-03-13 (×2): qty 2

## 2021-03-13 MED ORDER — PHENYLEPHRINE HCL-NACL 20-0.9 MG/250ML-% IV SOLN
INTRAVENOUS | Status: DC | PRN
  Administered 2021-03-13: 30 ug/min via INTRAVENOUS

## 2021-03-13 MED ORDER — OXYCODONE HCL 5 MG PO TABS
10.0000 mg | ORAL_TABLET | ORAL | Status: DC | PRN
  Administered 2021-03-14 (×2): 10 mg via ORAL
  Administered 2021-03-14 (×2): 15 mg via ORAL
  Administered 2021-03-14: 10 mg via ORAL
  Administered 2021-03-15: 15 mg via ORAL
  Administered 2021-03-15: 10 mg via ORAL
  Administered 2021-03-15 – 2021-03-17 (×3): 15 mg via ORAL
  Filled 2021-03-13: qty 3
  Filled 2021-03-13: qty 2
  Filled 2021-03-13: qty 3
  Filled 2021-03-13 (×2): qty 2
  Filled 2021-03-13: qty 3
  Filled 2021-03-13: qty 2
  Filled 2021-03-13 (×3): qty 3

## 2021-03-13 MED ORDER — ESCITALOPRAM OXALATE 10 MG PO TABS
20.0000 mg | ORAL_TABLET | Freq: Every day | ORAL | Status: DC
  Administered 2021-03-14 – 2021-03-17 (×4): 20 mg via ORAL
  Filled 2021-03-13 (×4): qty 2

## 2021-03-13 MED ORDER — MIDAZOLAM HCL 2 MG/2ML IJ SOLN
INTRAMUSCULAR | Status: AC
  Filled 2021-03-13: qty 2

## 2021-03-13 MED ORDER — CHLORHEXIDINE GLUCONATE 0.12 % MT SOLN
OROMUCOSAL | Status: AC
  Administered 2021-03-13: 15 mL
  Filled 2021-03-13: qty 15

## 2021-03-13 MED ORDER — INSULIN ASPART 100 UNIT/ML IJ SOLN
15.0000 [IU] | Freq: Three times a day (TID) | INTRAMUSCULAR | Status: DC
  Administered 2021-03-14: 15 [IU] via SUBCUTANEOUS

## 2021-03-13 MED ORDER — BUPIVACAINE IN DEXTROSE 0.75-8.25 % IT SOLN
INTRATHECAL | Status: DC | PRN
  Administered 2021-03-13: 2 mL via INTRATHECAL

## 2021-03-13 MED ORDER — TRANEXAMIC ACID-NACL 1000-0.7 MG/100ML-% IV SOLN
1000.0000 mg | INTRAVENOUS | Status: AC
  Administered 2021-03-13: 1000 mg via INTRAVENOUS
  Filled 2021-03-13: qty 100

## 2021-03-13 MED ORDER — OXYCODONE HCL 5 MG PO TABS
5.0000 mg | ORAL_TABLET | Freq: Once | ORAL | Status: AC | PRN
  Administered 2021-03-13: 5 mg via ORAL

## 2021-03-13 MED ORDER — ACETAMINOPHEN 325 MG PO TABS
325.0000 mg | ORAL_TABLET | Freq: Four times a day (QID) | ORAL | Status: DC | PRN
  Administered 2021-03-14 – 2021-03-15 (×3): 650 mg via ORAL
  Filled 2021-03-13: qty 2
  Filled 2021-03-13 (×2): qty 1
  Filled 2021-03-13: qty 2
  Filled 2021-03-13: qty 1

## 2021-03-13 MED ORDER — DEXAMETHASONE SODIUM PHOSPHATE 10 MG/ML IJ SOLN
INTRAMUSCULAR | Status: AC
  Filled 2021-03-13: qty 1

## 2021-03-13 MED ORDER — SODIUM CHLORIDE 0.9 % IR SOLN
Status: DC | PRN
  Administered 2021-03-13: 1000 mL

## 2021-03-13 MED ORDER — FENTANYL CITRATE (PF) 250 MCG/5ML IJ SOLN
INTRAMUSCULAR | Status: DC | PRN
  Administered 2021-03-13: 25 ug via INTRAVENOUS
  Administered 2021-03-13 (×4): 50 ug via INTRAVENOUS
  Administered 2021-03-13: 25 ug via INTRAVENOUS

## 2021-03-13 MED ORDER — POVIDONE-IODINE 10 % EX SWAB
2.0000 "application " | Freq: Once | CUTANEOUS | Status: AC
  Administered 2021-03-13: 2 via TOPICAL

## 2021-03-13 MED ORDER — PHENOL 1.4 % MT LIQD: 1.0000 | OROMUCOSAL | Status: DC | PRN

## 2021-03-13 MED ORDER — FENTANYL CITRATE (PF) 250 MCG/5ML IJ SOLN
INTRAMUSCULAR | Status: AC
  Filled 2021-03-13: qty 5

## 2021-03-13 MED ORDER — PROPOFOL 1000 MG/100ML IV EMUL
INTRAVENOUS | Status: AC
  Filled 2021-03-13: qty 300

## 2021-03-13 MED ORDER — PROPOFOL 10 MG/ML IV BOLUS
INTRAVENOUS | Status: DC | PRN
Start: 2021-03-13 — End: 2021-03-13
  Administered 2021-03-13: 50 mg via INTRAVENOUS
  Administered 2021-03-13: 70 mg via INTRAVENOUS
  Administered 2021-03-13 (×2): 50 mg via INTRAVENOUS

## 2021-03-13 MED ORDER — CARVEDILOL 6.25 MG PO TABS
6.2500 mg | ORAL_TABLET | Freq: Two times a day (BID) | ORAL | Status: DC
  Administered 2021-03-14 – 2021-03-17 (×7): 6.25 mg via ORAL
  Filled 2021-03-13 (×7): qty 1

## 2021-03-13 MED ORDER — ASPIRIN 81 MG PO CHEW
81.0000 mg | CHEWABLE_TABLET | Freq: Two times a day (BID) | ORAL | Status: DC
  Administered 2021-03-13 – 2021-03-17 (×8): 81 mg via ORAL
  Filled 2021-03-13 (×8): qty 1

## 2021-03-13 MED ORDER — ONDANSETRON HCL 4 MG/2ML IJ SOLN: 4.0000 mg | Freq: Once | INTRAMUSCULAR | Status: DC | PRN

## 2021-03-13 MED ORDER — 0.9 % SODIUM CHLORIDE (POUR BTL) OPTIME
TOPICAL | Status: DC | PRN
  Administered 2021-03-13: 1000 mL

## 2021-03-13 MED ORDER — ONDANSETRON HCL 4 MG/2ML IJ SOLN: 4.0000 mg | Freq: Four times a day (QID) | INTRAMUSCULAR | Status: DC | PRN

## 2021-03-13 MED ORDER — ACETAMINOPHEN 10 MG/ML IV SOLN
INTRAVENOUS | Status: DC | PRN
  Administered 2021-03-13: 1000 mg via INTRAVENOUS

## 2021-03-13 MED ORDER — CEFAZOLIN IN SODIUM CHLORIDE 3-0.9 GM/100ML-% IV SOLN
3.0000 g | INTRAVENOUS | Status: AC
  Administered 2021-03-13 (×2): 3 g via INTRAVENOUS
  Filled 2021-03-13: qty 100

## 2021-03-13 MED ORDER — METOCLOPRAMIDE HCL 5 MG PO TABS: 5.0000 mg | ORAL_TABLET | Freq: Three times a day (TID) | ORAL | Status: DC | PRN

## 2021-03-13 MED ORDER — MIDAZOLAM HCL 2 MG/2ML IJ SOLN
INTRAMUSCULAR | Status: DC | PRN
Start: 2021-03-13 — End: 2021-03-13
  Administered 2021-03-13: 2 mg via INTRAVENOUS

## 2021-03-13 MED ORDER — KETOROLAC TROMETHAMINE 30 MG/ML IJ SOLN
INTRAMUSCULAR | Status: AC
  Filled 2021-03-13: qty 1

## 2021-03-13 MED ORDER — CHLORHEXIDINE GLUCONATE 0.12 % MT SOLN: 15.0000 mL | Freq: Once | OROMUCOSAL | Status: AC

## 2021-03-13 MED ORDER — ALBUMIN HUMAN 5 % IV SOLN
INTRAVENOUS | Status: DC | PRN
Start: 2021-03-13 — End: 2021-03-13

## 2021-03-13 MED ORDER — VASOPRESSIN 20 UNIT/ML IV SOLN
INTRAVENOUS | Status: AC
  Filled 2021-03-13: qty 1

## 2021-03-13 MED ORDER — PROPOFOL 500 MG/50ML IV EMUL
INTRAVENOUS | Status: DC | PRN
  Administered 2021-03-13: 50 ug/kg/min via INTRAVENOUS

## 2021-03-13 MED ORDER — LACTATED RINGERS IV SOLN: INTRAVENOUS | Status: DC

## 2021-03-13 MED ORDER — METHOCARBAMOL 500 MG PO TABS
500.0000 mg | ORAL_TABLET | Freq: Four times a day (QID) | ORAL | Status: DC | PRN
  Administered 2021-03-14 – 2021-03-15 (×3): 500 mg via ORAL
  Filled 2021-03-13 (×5): qty 1

## 2021-03-13 MED ORDER — GABAPENTIN 300 MG PO CAPS
300.0000 mg | ORAL_CAPSULE | Freq: Three times a day (TID) | ORAL | Status: DC
  Administered 2021-03-13 – 2021-03-14 (×4): 300 mg via ORAL
  Administered 2021-03-15 (×3): 600 mg via ORAL
  Administered 2021-03-16 – 2021-03-17 (×4): 300 mg via ORAL
  Filled 2021-03-13 (×3): qty 1
  Filled 2021-03-13 (×2): qty 2
  Filled 2021-03-13 (×6): qty 1
  Filled 2021-03-13: qty 2

## 2021-03-13 MED ORDER — ACETAMINOPHEN 10 MG/ML IV SOLN: 1000.0000 mg | Freq: Once | INTRAVENOUS | Status: DC | PRN

## 2021-03-13 MED ORDER — OXYCODONE HCL 5 MG PO TABS
ORAL_TABLET | ORAL | Status: AC
  Filled 2021-03-13: qty 1

## 2021-03-13 MED ORDER — MENTHOL 3 MG MT LOZG: 1.0000 | LOZENGE | OROMUCOSAL | Status: DC | PRN

## 2021-03-13 MED ORDER — CEFAZOLIN SODIUM-DEXTROSE 2-4 GM/100ML-% IV SOLN
2.0000 g | Freq: Four times a day (QID) | INTRAVENOUS | Status: AC
  Administered 2021-03-13 – 2021-03-14 (×2): 2 g via INTRAVENOUS
  Filled 2021-03-13 (×2): qty 100

## 2021-03-13 MED ORDER — PANTOPRAZOLE SODIUM 40 MG PO TBEC
40.0000 mg | DELAYED_RELEASE_TABLET | Freq: Every day | ORAL | Status: DC
  Administered 2021-03-14 – 2021-03-17 (×4): 40 mg via ORAL
  Filled 2021-03-13 (×4): qty 1

## 2021-03-13 MED ORDER — METOCLOPRAMIDE HCL 5 MG/ML IJ SOLN: 5.0000 mg | Freq: Three times a day (TID) | INTRAMUSCULAR | Status: DC | PRN

## 2021-03-13 MED ORDER — AMLODIPINE BESYLATE 10 MG PO TABS
10.0000 mg | ORAL_TABLET | Freq: Every day | ORAL | Status: DC
  Administered 2021-03-14 – 2021-03-17 (×4): 10 mg via ORAL
  Filled 2021-03-13 (×4): qty 1

## 2021-03-13 SURGICAL SUPPLY — 75 items
ACETAB CUP W/GRIPTION 54 (Plate) ×3 IMPLANT
BAG COUNTER SPONGE SURGICOUNT (BAG) ×3 IMPLANT
BENZOIN TINCTURE PRP APPL 2/3 (GAUZE/BANDAGES/DRESSINGS) ×2 IMPLANT
BLADE SAW SGTL 18X1.27X75 (BLADE) ×3 IMPLANT
BNDG ELASTIC 6X5.8 VLCR STR LF (GAUZE/BANDAGES/DRESSINGS) IMPLANT
BNDG GAUZE ELAST 4 BULKY (GAUZE/BANDAGES/DRESSINGS) ×2 IMPLANT
CABLE CERLAGE W/CRIMP 1.8 (Cable) ×1 IMPLANT
COLLAR OFFSET CORAIL SZ 11 HIP (Stem) IMPLANT
CORAIL OFFSET COLLAR SZ 11 HIP (Stem) ×3 IMPLANT
COVER SURGICAL LIGHT HANDLE (MISCELLANEOUS) ×3 IMPLANT
CUP ACETAB W/GRIPTION 54 (Plate) IMPLANT
DRAPE C-ARM 42X72 X-RAY (DRAPES) ×3 IMPLANT
DRAPE IMP U-DRAPE 54X76 (DRAPES) ×2 IMPLANT
DRAPE ORTHO SPLIT 77X108 STRL (DRAPES)
DRAPE STERI IOBAN 125X83 (DRAPES) ×3 IMPLANT
DRAPE SURG ORHT 6 SPLT 77X108 (DRAPES) IMPLANT
DRAPE U-SHAPE 47X51 STRL (DRAPES) ×9 IMPLANT
DRSG AQUACEL AG ADV 3.5X10 (GAUZE/BANDAGES/DRESSINGS) ×4 IMPLANT
DRSG EMULSION OIL 3X3 NADH (GAUZE/BANDAGES/DRESSINGS) ×2 IMPLANT
DRSG PAD ABDOMINAL 8X10 ST (GAUZE/BANDAGES/DRESSINGS) ×2 IMPLANT
DRSG XEROFORM 1X8 (GAUZE/BANDAGES/DRESSINGS) ×1 IMPLANT
DURAPREP 26ML APPLICATOR (WOUND CARE) ×3 IMPLANT
ELECT BLADE 4.0 EZ CLEAN MEGAD (MISCELLANEOUS) ×3
ELECT BLADE 6.5 EXT (BLADE) IMPLANT
ELECT REM PT RETURN 9FT ADLT (ELECTROSURGICAL) ×3
ELECTRODE BLDE 4.0 EZ CLN MEGD (MISCELLANEOUS) ×2 IMPLANT
ELECTRODE REM PT RTRN 9FT ADLT (ELECTROSURGICAL) ×2 IMPLANT
EXTRACTOR BROKEN SCREW 7 (INSTRUMENTS) ×1 IMPLANT
EXTRACTOR STRIPED SCREW 8X03 (INSTRUMENTS) ×2 IMPLANT
EXTRACTOR STRIPPED SCREW 4 (INSTRUMENTS) ×2 IMPLANT
EXTRACTOR STRIPPED SCREW 5 (INSTRUMENTS) ×1 IMPLANT
FACESHIELD WRAPAROUND (MASK) ×6 IMPLANT
FACESHIELD WRAPAROUND OR TEAM (MASK) ×4 IMPLANT
GAUZE SPONGE 4X4 12PLY STRL (GAUZE/BANDAGES/DRESSINGS) ×2 IMPLANT
GLOVE SRG 8 PF TXTR STRL LF DI (GLOVE) ×4 IMPLANT
GLOVE SURG ENC MOIS LTX SZ8 (GLOVE) ×3 IMPLANT
GLOVE SURG LTX SZ8 (GLOVE) ×3 IMPLANT
GLOVE SURG ORTHO LTX SZ7.5 (GLOVE) ×6 IMPLANT
GLOVE SURG UNDER POLY LF SZ8 (GLOVE) ×6
GOWN STRL REUS W/ TWL LRG LVL3 (GOWN DISPOSABLE) ×6 IMPLANT
GOWN STRL REUS W/ TWL XL LVL3 (GOWN DISPOSABLE) ×4 IMPLANT
GOWN STRL REUS W/TWL LRG LVL3 (GOWN DISPOSABLE) ×9
GOWN STRL REUS W/TWL XL LVL3 (GOWN DISPOSABLE) ×6
HANDPIECE INTERPULSE COAX TIP (DISPOSABLE) ×3
HEAD CERAMIC DELTA 36 PLUS 1.5 (Hips) ×1 IMPLANT
KIT BASIN OR (CUSTOM PROCEDURE TRAY) ×3 IMPLANT
KIT TURNOVER KIT B (KITS) ×3 IMPLANT
LINER NEUTRAL 54X36MM PLUS 4 (Hips) ×1 IMPLANT
MANIFOLD NEPTUNE II (INSTRUMENTS) ×3 IMPLANT
NS IRRIG 1000ML POUR BTL (IV SOLUTION) ×3 IMPLANT
PACK GENERAL/GYN (CUSTOM PROCEDURE TRAY) ×3 IMPLANT
PACK TOTAL JOINT (CUSTOM PROCEDURE TRAY) ×3 IMPLANT
PACK UNIVERSAL I (CUSTOM PROCEDURE TRAY) ×1 IMPLANT
PAD ARMBOARD 7.5X6 YLW CONV (MISCELLANEOUS) ×6 IMPLANT
PAD CAST 4YDX4 CTTN HI CHSV (CAST SUPPLIES) ×2 IMPLANT
PADDING CAST COTTON 4X4 STRL (CAST SUPPLIES)
SET HNDPC FAN SPRY TIP SCT (DISPOSABLE) ×2 IMPLANT
SPONGE T-LAP 18X18 ~~LOC~~+RFID (SPONGE) ×1 IMPLANT
STAPLER VISISTAT 35W (STAPLE) ×3 IMPLANT
STRIP CLOSURE SKIN 1/2X4 (GAUZE/BANDAGES/DRESSINGS) ×6 IMPLANT
SUT ETHIBOND NAB CT1 #1 30IN (SUTURE) ×3 IMPLANT
SUT ETHILON 4 0 FS 1 (SUTURE) IMPLANT
SUT MNCRL AB 4-0 PS2 18 (SUTURE) IMPLANT
SUT VIC AB 0 CT1 27 (SUTURE) ×9
SUT VIC AB 0 CT1 27XBRD ANBCTR (SUTURE) ×2 IMPLANT
SUT VIC AB 1 CT1 27 (SUTURE) ×6
SUT VIC AB 1 CT1 27XBRD ANBCTR (SUTURE) ×2 IMPLANT
SUT VIC AB 2-0 CT1 27 (SUTURE) ×12
SUT VIC AB 2-0 CT1 TAPERPNT 27 (SUTURE) ×2 IMPLANT
TOWEL GREEN STERILE (TOWEL DISPOSABLE) ×3 IMPLANT
TOWEL GREEN STERILE FF (TOWEL DISPOSABLE) ×3 IMPLANT
TRAY FOLEY W/BAG SLVR 16FR (SET/KITS/TRAYS/PACK) ×3
TRAY FOLEY W/BAG SLVR 16FR ST (SET/KITS/TRAYS/PACK) IMPLANT
WATER STERILE IRR 1000ML POUR (IV SOLUTION) ×6 IMPLANT
YANKAUER SUCT BULB TIP NO VENT (SUCTIONS) ×1 IMPLANT

## 2021-03-13 NOTE — Anesthesia Preprocedure Evaluation (Addendum)
Anesthesia Evaluation  Patient identified by MRN, date of birth, ID band Patient awake    Reviewed: Allergy & Precautions, NPO status , Patient's Chart, lab work & pertinent test results  Airway Mallampati: II  TM Distance: >3 FB Neck ROM: Full    Dental no notable dental hx. (+) Teeth Intact, Dental Advisory Given   Pulmonary neg pulmonary ROS, sleep apnea and Continuous Positive Airway Pressure Ventilation , Patient abstained from smoking., former smoker,    Pulmonary exam normal breath sounds clear to auscultation       Cardiovascular hypertension, Pt. on medications Normal cardiovascular exam Rhythm:Regular Rate:Normal     Neuro/Psych  Headaches, Anxiety TIAnegative neurological ROS  negative psych ROS   GI/Hepatic negative GI ROS, Neg liver ROS,   Endo/Other  diabetes, Well Controlled, Type obesity (BMI 46.00)  Renal/GU negative Renal ROS  negative genitourinary   Musculoskeletal negative musculoskeletal ROS (+) Arthritis ,   Abdominal   Peds negative pediatric ROS (+)  Hematology negative hematology ROS (+) Lab Results      Component                Value               Date                      WBC                      8.0                 03/09/2021                HGB                      13.3                03/09/2021                HCT                      40.1                03/09/2021                MCV                      93.5                03/09/2021                PLT                      126 (L)             03/09/2021              Anesthesia Other Findings Losartan  Reproductive/Obstetrics negative OB ROS                           Anesthesia Physical Anesthesia Plan  ASA: 3  Anesthesia Plan: Spinal   Post-op Pain Management: Minimal or no pain anticipated   Induction: Intravenous  PONV Risk Score and Plan: Treatment may vary due to age or medical condition,  Ondansetron and Dexamethasone  Airway Management Planned: Simple Face Mask  Additional Equipment:   Intra-op Plan:   Post-operative Plan:  Informed Consent: I have reviewed the patients History and Physical, chart, labs and discussed the procedure including the risks, benefits and alternatives for the proposed anesthesia with the patient or authorized representative who has indicated his/her understanding and acceptance.     Dental advisory given  Plan Discussed with: CRNA and Surgeon  Anesthesia Plan Comments: (Sp)       Anesthesia Quick Evaluation

## 2021-03-13 NOTE — Progress Notes (Signed)
Pt has home cpap machine, is set up and ready for use. Pt able to place self on and off as needed

## 2021-03-13 NOTE — Anesthesia Procedure Notes (Signed)
Spinal  Patient location during procedure: OR Start time: 03/13/2021 2:55 PM End time: 03/13/2021 3:00 PM Reason for block: surgical anesthesia Staffing Performed: anesthesiologist  Anesthesiologist: Eilene Ghazi, MD Preanesthetic Checklist Completed: patient identified, IV checked, site marked, risks and benefits discussed, surgical consent, monitors and equipment checked, pre-op evaluation and timeout performed Spinal Block Patient position: sitting Prep: Betadine Patient monitoring: heart rate, continuous pulse ox and blood pressure Approach: midline Location: L3-4 Injection technique: single-shot Needle Needle type: Sprotte  Needle gauge: 24 G Needle length: 9 cm Assessment Sensory level: T6 Events: CSF return Additional Notes

## 2021-03-13 NOTE — Anesthesia Procedure Notes (Signed)
Procedure Name: Intubation Date/Time: 03/13/2021 5:37 PM Performed by: Atilano Median, DO Pre-anesthesia Checklist: Patient identified, Emergency Drugs available, Suction available and Patient being monitored Patient Re-evaluated:Patient Re-evaluated prior to induction Oxygen Delivery Method: Circle system utilized Preoxygenation: Pre-oxygenation with 100% oxygen Induction Type: IV induction LMA: LMA inserted LMA Size: 5.0 Tube type: Oral Number of attempts: 1 Dental Injury: Teeth and Oropharynx as per pre-operative assessment

## 2021-03-13 NOTE — Op Note (Signed)
NAMDebbora Dus: Gorder, Remer MEDICAL RECORD NO: [id number] ACCOUNT NO: [id number] DATE OF BIRTH: 1975-11-14 FACILITY: [facility] LOCATION: [facility] PHYSICIAN: Vanita Pandahristopher Y. Magnus IvanBlackman, MD  Operative Report   DATE OF PROCEDURE: 03/13/2021   PREOPERATIVE DIAGNOSES:   1.  Primary osteoarthritis, degenerative joint disease, right hip. 2.  Retained orthopedic hardware with screws x2, right hip. 3.  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  SURGEON:  Doneen Poissonhristopher Brettney Ficken, [name]  IMPLANTS:  DePuy Sector Gription acetabular component, size 54, size 36+4 neutral polyethylene liner, size 11 Corail femoral component with high offset, size 36+1.5 ceramic hip ball, one Zimmer cable.  SURGEON:  Doneen Poissonhristopher Bralon Antkowiak, [name]   ASSISTANT: Rexene EdisonGil Clark, PA-C.  ANESTHESIA:   1.  Spinal. 2.  General.  ANTIBIOTICS:  3 grams IV Ancef.  ESTIMATED BLOOD LOSS:  650 mL.  COMPLICATIONS:  None.  INDICATIONS:  The patient is a 46 year old gentleman who is morbidly obese with a BMI of over 40.  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 Over 30  years later, he developed significant arthritis in his right hip.  His pain is daily and is detrimentally affecting his mobility, his quality of life and activities of daily living.  He has tried weight loss and other conservative treatment.  He is at  the point that he does wish to proceed with a total hip arthroplasty.  He understands that this surgery is going to be incredibly complicated and difficult given his obesity and given the fact that these screws have been in for over 30 years.  He  understands there is certainly heightened risk of acute blood loss anemia, nerve or vessel injury, fracture, infection, DVT, implant failure, skin and soft tissue issues and leg length discrepancy as well as dislocation.  He understands our goals are to  hopefully decrease  pain, improve mobility and overall improve quality of life.   DESCRIPTION OF PROCEDURE:  After informed consent was obtained, appropriate right hip was marked, he was brought to the operating room and spinal anesthesia was obtained and legs on the stretcher.  Foley catheter was placed and traction boots were placed  on both his feet.  Next, he was placed supine on the Hana fracture table, the perineal post in place and both legs in line skeletal traction device and no traction applied.  We did have to tape his pannus out of the field from a large belly.  We then  assessed his right hip radiographically and then prepped it with DuraPrep and sterile drapes.  A timeout was called and he was identified correct patient, correct right hip.  We then made a lateral based incision over his previous incision and dissected  down to the screws.  We were able to easily expose the screws, but they were quite difficult to remove.  They ended up being DePuy ACE types of screws and we had to use a screw removal set and spent at least over an hour to 2 hours to back the screws  out.  We did sustain a fracture of the bone at that level 2 and placed a Zimmer cable around the femur at that level.  Once we finally got the screws out, attention was turned to the hip replacement.  We then made a separate incision for direct anterior  approach with 1 fingerbreadth or so distal and posterior to the anterior superior iliac spine and carried  this just slightly obliquely down the leg.  We dissected down tensor fascia lata muscle.  Tensor fascia was then divided longitudinally to proceed  with direct anterior approach to the hip.  We identified and cauterized circumflex vessels then identified the hip capsule, opened the hip capsule L-type format finding a moderate joint effusion.  There was significant flattening of the femoral head  disease around the hip and the shortened femoral neck.  We placed a Cobra retractor on the medial and  lateral femoral neck and made a femoral neck cut with oscillating saw just proximal to the lesser trochanter and completed this with an osteotome.  We  placed a corkscrew guide in the femoral head and removed the femoral head its entirety and found a wide area devoid of cartilage.  We then cleaned the acetabular remnants, acetabular labrum and other debris and placed a bent Hohmann over the medial  acetabular rim, we then began reaming under direct visualization from a size 43 reamer in a stepwise increments going up to a size 53 reamer with all reamers placed under direct visualization.  Last reamer was placed under direct fluoroscopy, so we could  obtain our depth of reaming, our inclination and anteversion.  I then placed real DePuy sector Gription acetabular component, size 54 and went with a 36+4 neutral polyethylene liner.  Attention was then turned to the femur.  With the leg externally  rotated to 120 degrees, extended and adducted, we were able to place a Mueller retractor medially and Hohman retractor behind the greater trochanter.  We released lateral joint capsule and used a box cutting osteotome to enter femoral canal and a rongeur  to lateralize, we then began broaching using the ACTIS broaching system and we broached up to a size 4 broach.  Then we took fluoroscopic images.  We had broached to the posteromedial cortex of the femur.  We had to back the broach out and then we  decided to switch to the Corail broaching system.  It was quite difficult, but we were able to get a longer stem and then finally we were able to get a size 11 femoral component and taking quite amount of time to get it down the canal.  Once we got it  down the canal, we trialed a standard offset femoral neck and a 36+1.5 hip ball, reduced this in acetabulum and based on radiographic assessment and clinical assessment, we felt like the final stem needed to be with high offset.  We dislocated the hip,  removed the trial  components.  We then opened up the Corail size 11 high offset stem and it took several passes to finally get it down the central part of the canal with a bowing of the femur and the previous broaching of the posteromedial cortex that we  had done.  We finally got the final stem and verified that it was in the middle of the canal through multiple views with the C-arm internally and externally rotating the leg at the same time.  We then placed a real 36+1.5 ceramic hip ball, reduced this  in acetabulum and it was stable and again assessed radiographically, it was stable.  We then irrigated both wounds with normal saline solution.  The deep tissue was closed with #1 Vicryl in both incisions followed by 0 Vicryl the next layer and 2-0  Vicryl in subcutaneous tissue and staples on both skin incisions.  Xeroform and well-padded sterile dressing was applied.  Of note during the  case since it was taking a while and the total case time was just over 3 hours his spinal started to wear down,  so he was switched over to general anesthesia.  He was awakened, extubated, and taken to recovery room in stable condition with all final counts being correct.  No complications noted.  Of note, Rexene Edison, PA-C did assist during the entire case and his  assistance was crucial for facilitating every aspect of this case.     SUJ D: 03/13/2021 6:48:47 pm T: 03/13/2021 11:55:00 pm  JOB: [id number]/ [id number]

## 2021-03-13 NOTE — Brief Op Note (Signed)
03/13/2021  6:50 PM  PATIENT:  [name]  46 y.o. male  PRE-OPERATIVE DIAGNOSIS:  Osteoarthritis ,Degenerative Joint Diease Right Hip, Retained Screws Right Hip  POST-OPERATIVE DIAGNOSIS:  Osteoarthritis ,Degenerative Joint Diease Right Hip, Retained   PROCEDURE:  Procedure(s): Right TOTAL HIP ARTHROPLASTY ANTERIOR APPROACH (Right) Right Hip HARDWARE REMOVAL (Right)  SURGEON:  Surgeon(s) and Role:    Mcarthur Rossetti, MD - Primary  PHYSICIAN ASSISTANT:  Benita Stabile, PA-C  ANESTHESIA:   spinal and general  EBL:  650 mL   COUNTS:  YES  PLAN OF CARE: Admit for overnight observation  PATIENT DISPOSITION:  PACU - hemodynamically stable.   Delay start of Pharmacological VTE agent (>24hrs) due to surgical blood loss or risk of bleeding: no

## 2021-03-13 NOTE — Anesthesia Postprocedure Evaluation (Signed)
Anesthesia Post Note  Patient: Actor  Procedure(s) Performed: Right TOTAL HIP ARTHROPLASTY ANTERIOR APPROACH (Right: Hip) Right Hip HARDWARE REMOVAL (Right)     Patient location during evaluation: PACU Anesthesia Type: General Level of consciousness: sedated Pain management: pain level controlled Vital Signs Assessment: post-procedure vital signs reviewed and stable Respiratory status: spontaneous breathing and respiratory function stable Cardiovascular status: stable Postop Assessment: no apparent nausea or vomiting Anesthetic complications: no   No notable events documented.  Last Vitals:  Vitals:   03/13/21 2005 03/13/21 2020  BP: 119/82 106/62  Pulse: 74 75  Resp: 13 16  Temp:    SpO2: 100% 98%    Last Pain:  Vitals:   03/13/21 2020  TempSrc:   PainSc: 6                  Ronnica Dreese [name]

## 2021-03-13 NOTE — Transfer of Care (Signed)
Immediate Anesthesia Transfer of Care Note  Patient: [name]  Procedure(s) Performed: Right TOTAL HIP ARTHROPLASTY ANTERIOR APPROACH (Right: Hip) Right Hip HARDWARE REMOVAL (Right)  Patient Location: PACU  Anesthesia Type:GA combined with regional for post-op pain  Level of Consciousness: drowsy  Airway & Oxygen Therapy: Patient Spontanous Breathing and Patient connected to face mask oxygen  Post-op Assessment: Report given to RN and Post -op Vital signs reviewed and stable  Post vital signs: Reviewed and stable  Last Vitals:  Vitals Value Taken Time  BP 137/91 03/13/21 1934  Temp    Pulse 84 03/13/21 1937  Resp 17 03/13/21 1937  SpO2 98 % 03/13/21 1937  Vitals shown include unvalidated device data.  Last Pain:  Vitals:   03/13/21 1200  TempSrc:   PainSc: 0-No pain         Complications: No notable events documented.

## 2021-03-13 NOTE — Anesthesia Procedure Notes (Deleted)
Procedure Name: LMA Insertion Date/Time: 03/13/2021 5:36 PM Performed by: Cy Blamer, CRNA Pre-anesthesia Checklist: Patient identified, Emergency Drugs available, Suction available, Patient being monitored and Timeout performed Patient Re-evaluated:Patient Re-evaluated prior to induction Oxygen Delivery Method: Circle system utilized Preoxygenation: Pre-oxygenation with 100% oxygen Induction Type: IV induction LMA: LMA inserted LMA Size: 5.0 Number of attempts: 1 Placement Confirmation: positive ETCO2, CO2 detector and breath sounds checked- equal and bilateral Tube secured with: Tape Dental Injury: Teeth and Oropharynx as per pre-operative assessment

## 2021-03-13 NOTE — H&P (Signed)
TOTAL HIP ADMISSION H&P  Patient is admitted for right total hip arthroplasty.  Subjective:  Chief Complaint: right hip pain  HPI: [name], 46 y.o. male, has a history of pain and functional disability in the right hip(s) due to arthritis and retained orthopedic hardware in the right hip  and patient has failed non-surgical conservative treatments for greater than 12 weeks to include NSAID's and/or analgesics, flexibility and strengthening excercises, use of assistive devices, weight reduction as appropriate, and activity modification.  Onset of symptoms was gradual starting 5 years ago with gradually worsening course since that time.The patient noted prior procedures of the hip to include screw placement to both hips as an adolescent due to slipped capital femoral epiphysis bilaterally  on the bilaterally hip(s).  Patient currently rates pain in the right hip at 10 out of 10 with activity. Patient has night pain, worsening of pain with activity and weight bearing, trendelenberg gait, pain that interfers with activities of daily living, and pain with passive range of motion. Patient has evidence of subchondral sclerosis, joint space narrowing, and retained screws  by imaging studies. This condition presents safety issues increasing the risk of falls. 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  There is no current active infection.  Patient Active Problem List   Diagnosis Date Noted   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/01/2020   Unilateral primary osteoarthritis, right hip 10/28/2019   Unilateral primary osteoarthritis, left hip 10/28/2019   Retained orthopedic hardware 10/28/2019   Pain in right leg 09/09/2019   Shoulder joint pain 09/09/2019   Insomnia 07/20/2019   Stenosis of cervical spine with myelopathy ([place]) 05/26/2019   Neck pain 03/11/2019   Encounter for general adult medical examination without abnormal findings 07/10/2018   Peripheral vascular disease ([place])  07/10/2018   Thrombocytopenia, unspecified ([place]) 07/03/2018   Type 2 diabetes mellitus without complication ([place]) [id number]   OSA (obstructive sleep apnea) 07/03/2018   Excessive daytime sleepiness 07/03/2018   Class 3 severe obesity due to excess calories with serious comorbidity and body mass index (BMI) of 40.0 to 44.9 in adult ([place]) 07/03/2018   Non-seasonal allergic rhinitis due to pollen 07/03/2018   Anxiety disorder 06/05/2018   Chronic rhinitis 03/10/2018   Acute embolism and thrombosis of unspecified deep veins of right lower extremity ([place]) 02/19/2018   Plantar fascial fibromatosis 10/22/2017   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   TIA (transient ischemic attack) 10/07/2017   Urticaria 06/27/2017   Long term (current) use of insulin ([place]) 06/17/2017   Plantar wart 03/13/2017   ED (erectile dysfunction) of organic origin 12/13/2016   Degeneration of lumbosacral intervertebral disc 10/31/2016   Tobacco user 10/02/2016   Adjustment disorder with depressed mood 07/30/2016   Diabetic peripheral neuropathy associated with type 2 diabetes mellitus ([place]) 07/30/2016   Essential hypertension 07/30/2016   Fatigue 07/30/2016   Lipoprotein deficiency disorder [id number]   Umbilical hernia [id number]   Past Medical History:  Diagnosis Date   Acute pancreatitis 07/2010   Anxiety    Arthritis    "lower back and shoulders" (10/07/2017)   Chronic lower back pain    Dyslipidemia    Dyspnea    Headache    "weekly" (10/07/2017)   Hypercholesteremia    Hypertension    MRSA (methicillin resistant staph aureus) culture positive    Obesity    Obesity    OSA on CPAP    Peripheral neuropathy    Scoliosis    Sleep apnea    "  can't tolerate any of the masks" (10/07/2017)   Sleep apnea    Stroke ([place])    stroke-like 2.5 years ago   TIA (transient ischemic attack)    Type II diabetes mellitus ([place])    Umbilical hernia     Past Surgical History:  Procedure Laterality Date   HIP  PINNING Bilateral    "they are worn out" (10/07/2017)   PICC LINE INSERTION  07/2010   "in hospital for ~ 2 months; took PICC out when I was discharged"   POSTERIOR CERVICAL FUSION/FORAMINOTOMY N/A 05/26/2019   Procedure: POSTERIOR CERVICAL FUSION WITH LATERAL MASS FIXATION CERVICAL THREE- THORACIC ONE;  Surgeon: Vallarie Mare, MD;  Location: [place];  Service: Neurosurgery;  Laterality: N/A;  posterior, Cervical/Thoracic   TESTICLE REMOVAL Right ~ 2012   "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"    Current Facility-Administered Medications  Medication Dose Route Frequency Provider Last Rate Last Admin   ceFAZolin (ANCEF) IVPB 3g/100 mL premix  3 g Intravenous On Call to OR Pete Pelt, PA-C       povidone-iodine 10 % swab 2 application  2 application Topical Once Pete Pelt, PA-C       tranexamic acid (CYKLOKAPRON) IVPB 1,000 mg  1,000 mg Intravenous To OR Pete Pelt, PA-C       Allergies  Allergen Reactions   Losartan Swelling and Rash    Patient says 'he's not 100% sure if it was losartan, but he thinks it is." He knows it was a blood pressure medicine.    Social History   Tobacco Use   Smoking status: Former    Packs/day: 0.50    Years: 20.00    Pack years: 10.00    Types: Cigarettes   Smokeless tobacco: Former    Types: Snuff   Tobacco comments:    10/07/2017 "quit years ago"  Substance Use Topics   Alcohol use: Yes    Comment: rare    Family History  Problem Relation Age of Onset   Hypertension Mother    Hypertension Father    Sleep apnea Neg Hx      Review of Systems  Musculoskeletal:  Positive for gait problem.  All other systems reviewed and are negative.  Objective:  Physical Exam Vitals reviewed.  Constitutional:      Appearance: Normal appearance.  HENT:     Head: Normocephalic and atraumatic.  Eyes:     Extraocular Movements: Extraocular movements intact.     Pupils: Pupils are equal, round, and reactive to light.   Cardiovascular:     Rate and Rhythm: Normal rate and regular rhythm.  Pulmonary:     Effort: Pulmonary effort is normal.     Breath sounds: Normal breath sounds.  Abdominal:     Palpations: Abdomen is soft.  Musculoskeletal:     Cervical back: Normal range of motion and neck supple.     Right hip: Tenderness and bony tenderness present. Decreased range of motion. Decreased strength.  Neurological:     Mental Status: He is alert and oriented to person, place, and time.  Psychiatric:        Behavior: Behavior normal.    Vital signs in last 24 hours: Temp:  [98.4 F (36.9 C)] 98.4 F (36.9 C) (01/10 1130) Pulse Rate:  [74] 74 (01/10 1130) Resp:  [17] 17 (01/10 1130) BP: (147)/(85) 147/85 (01/10 1130) SpO2:  [98 %] 98 % (01/10 1130) Weight:  [129.3 kg] 129.3 kg (01/10 1130)  Labs:   Estimated body mass index is 46 kg/m as calculated from the following:   Height as of this encounter: 5\' 6"  (1.676 m).   Weight as of this encounter: 129.3 kg.   Imaging Review Plain radiographs demonstrate severe degenerative joint disease of the right hip(s). The bone quality appears to be good for age and reported activity level.      Assessment/Plan:  End stage arthritis, right hip(s)  The patient history, physical examination, clinical judgement of the provider and imaging studies are consistent with end stage degenerative joint disease of the right hip(s) and total hip arthroplasty is deemed medically necessary. The treatment options including medical management, injection therapy, arthroscopy and arthroplasty were discussed at length. The risks and benefits of total hip arthroplasty were presented and reviewed. The risks due to aseptic loosening, infection, stiffness, dislocation/subluxation,  thromboembolic complications and other imponderables were discussed.  The patient acknowledged the explanation, agreed to proceed with the plan and consent was signed. Patient is being admitted for  inpatient treatment for surgery, pain control, PT, OT, prophylactic antibiotics, VTE prophylaxis, progressive ambulation and ADL's and discharge planning.The patient is planning to be discharged home with home health services

## 2021-03-14 ENCOUNTER — Encounter (HOSPITAL_COMMUNITY): Payer: Self-pay | Admitting: Orthopaedic Surgery

## 2021-03-14 LAB — BASIC METABOLIC PANEL
Anion gap: 9 (ref 5–15)
BUN: 18 mg/dL (ref 6–20)
CO2: 21 mmol/L — ABNORMAL LOW (ref 22–32)
Calcium: 7.7 mg/dL — ABNORMAL LOW (ref 8.9–10.3)
Chloride: 107 mmol/L (ref 98–111)
Creatinine, Ser: 0.89 mg/dL (ref 0.61–1.24)
GFR, Estimated: >60 mL/min (ref >60)
Glucose, Bld: 176 mg/dL — ABNORMAL HIGH (ref 70–99)
Potassium: 4.9 mmol/L (ref 3.5–5.1)
Sodium: 137 mmol/L (ref 135–145)

## 2021-03-14 LAB — GLUCOSE, CAPILLARY
Glucose-Capillary: 179 mg/dL — ABNORMAL HIGH (ref 70–99)
Glucose-Capillary: 183 mg/dL — ABNORMAL HIGH (ref 70–99)
Glucose-Capillary: 194 mg/dL — ABNORMAL HIGH (ref 70–99)
Glucose-Capillary: 155 mg/dL — ABNORMAL HIGH (ref 70–99)

## 2021-03-14 LAB — CBC
HCT: 34.8 % — ABNORMAL LOW (ref 39.0–52.0)
Hemoglobin: 11.7 g/dL — ABNORMAL LOW (ref 13.0–17.0)
MCH: 31.7 pg (ref 26.0–34.0)
MCHC: 33.6 g/dL (ref 30.0–36.0)
MCV: 94.3 fL (ref 80.0–100.0)
Platelets: 118 10*3/uL — ABNORMAL LOW (ref 150–400)
RBC: 3.69 MIL/uL — ABNORMAL LOW (ref 4.22–5.81)
RDW: 13.1 % (ref 11.5–15.5)
WBC: 11.5 10*3/uL — ABNORMAL HIGH (ref 4.0–10.5)
nRBC: 0 % (ref 0.0–0.2)

## 2021-03-14 MED ORDER — INSULIN ASPART 100 UNIT/ML IJ SOLN
0.0000 [IU] | Freq: Every day | INTRAMUSCULAR | Status: DC
  Administered 2021-03-15 – 2021-03-16 (×2): 2 [IU] via SUBCUTANEOUS

## 2021-03-14 MED ORDER — INSULIN ASPART 100 UNIT/ML IJ SOLN
0.0000 [IU] | Freq: Three times a day (TID) | INTRAMUSCULAR | Status: DC
  Administered 2021-03-14 – 2021-03-16 (×7): 4 [IU] via SUBCUTANEOUS
  Administered 2021-03-16 – 2021-03-17 (×2): 7 [IU] via SUBCUTANEOUS

## 2021-03-14 NOTE — Progress Notes (Signed)
Subjective: 1 Day Post-Op Procedure(s) (LRB): Right TOTAL HIP ARTHROPLASTY ANTERIOR APPROACH (Right) Right Hip HARDWARE REMOVAL (Right) Patient reports pain as 10 on 0-10 scale.    Objective: Vital signs in last 24 hours: Temp:  [97.8 F (36.6 C)-99.2 F (37.3 C)] 99.2 F (37.3 C) (01/11 0700) Pulse Rate:  [70-90] 90 (01/11 0700) Resp:  [12-19] 19 (01/11 0700) BP: (98-143)/(62-94) 143/94 (01/11 0700) SpO2:  [95 %-100 %] 95 % (01/11 0700)  Intake/Output from previous day: 01/10 0701 - 01/11 0700 In: 2040 [P.O.:240; I.V.:1000; IV Piggyback:800] Out: 1350 [Urine:700; Blood:650] Intake/Output this shift: No intake/output data recorded.  Recent Labs    03/14/21 0249  HGB 11.7*   Recent Labs    03/14/21 0249  WBC 11.5*  RBC 3.69*  HCT 34.8*  PLT 118*   Recent Labs    03/14/21 0249  NA 137  K 4.9  CL 107  CO2 21*  BUN 18  CREATININE 0.89  GLUCOSE 176*  CALCIUM 7.7*   No results for input(s): LABPT, INR in the last 72 hours.  Sensation intact distally Intact pulses distally Dorsiflexion/Plantar flexion intact Incision: scant drainage   Assessment/Plan: 1 Day Post-Op Procedure(s) (LRB): Right TOTAL HIP ARTHROPLASTY ANTERIOR APPROACH (Right) Right Hip HARDWARE REMOVAL (Right) Up with therapy Only up to 20-25% weight on the right hip. Consider discharge when pain better controlled and more safe with mobility.     Kathryne Hitch 03/14/2021, 11:48 AM

## 2021-03-14 NOTE — TOC Initial Note (Incomplete)
Transition of Care [facility]) - Initial/Assessment Note    Patient Details  Name: [name] MRN: [id number] Date of Birth: 16-Nov-1975  Transition of Care [facility]) CM/SW Contact:    Epifanio Lesches, RN Phone Number: 03/14/2021, 1:09 PM  Clinical Narrative:                  -s/p Right TOTAL HIP ARTHROPLASTY From home with family. PTA independent with ADL's.  NCM spoke with pt @ bedside regarding d/c planning.  Expected Discharge Plan: Home w Home Health Services Barriers to Discharge: Continued Medical Work up   Patient Goals and CMS Choice     Choice offered to / list presented to : Patient  Expected Discharge Plan and Services Expected Discharge Plan: Home w Home Health Services   Discharge Planning Services: CM Consult                     DME Arranged: 3-N-1, Walker rolling DME Agency: [employer] Date DME Agency Contacted: 03/14/21 Time DME Agency Contacted: [phone] Representative spoke with at DME Agency: Velna Hatchet HH Arranged: PT HH Agency: [facility] Date HH Agency Contacted: 03/14/21 Time HH Agency Contacted: [id number] Representative spoke with at [facility] Agency: Pearson Grippe  Prior Living Arrangements/Services   Lives with:: Parents, Minor Children, Adult Children Patient language and need for interpreter reviewed:: Yes Do you feel safe going back to the place where you live?: Yes      Need for Family Participation in Patient Care: Yes (Comment) Care giver support system in place?: Yes (comment) Current home services: DME (cane) Criminal Activity/Legal Involvement Pertinent to Current Situation/Hospitalization: No - Comment as needed  Activities of Daily Living Home Assistive Devices/Equipment: Eyeglasses, CBG Meter, CPAP, Cane (specify quad or straight) ADL Screening (condition at time of admission) Patient's cognitive ability adequate to safely complete daily activities?: Yes Is the patient deaf or have difficulty hearing?: No Does the patient have  difficulty seeing, even when wearing glasses/contacts?: No Does the patient have difficulty concentrating, remembering, or making decisions?: No Patient able to express need for assistance with ADLs?: Yes Does the patient have difficulty dressing or bathing?: Yes Independently performs ADLs?: Yes (appropriate for developmental age) Does the patient have difficulty walking or climbing stairs?: Yes (fresh post op) Weakness of Legs: Right Weakness of Arms/Hands: None  Permission Sought/Granted   Permission granted to share information with : Yes, Verbal Permission Granted              Emotional Assessment Appearance:: Appears stated age Attitude/Demeanor/Rapport: Engaged Affect (typically observed): Accepting Orientation: : Oriented to Self, Oriented to Place, Oriented to  Time, Oriented to Situation Alcohol / Substance Use: Not Applicable Psych Involvement: No (comment)  Admission diagnosis:  Status post total replacement of right hip [Z96.641] Patient Active Problem List   Diagnosis Date Noted   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/01/2020   Unilateral primary osteoarthritis, right hip 10/28/2019   Unilateral primary osteoarthritis, left hip 10/28/2019   Retained orthopedic hardware 10/28/2019   Pain in right leg 09/09/2019   Shoulder joint pain 09/09/2019   Insomnia 07/20/2019   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   Neck pain 03/11/2019   Encounter for general adult medical examination without abnormal findings 07/10/2018   Peripheral vascular disease (HCC) 07/10/2018   Thrombocytopenia, unspecified (HCC) 07/03/2018   Type 2 diabetes mellitus without complication (HCC) 07/03/2018   OSA (obstructive sleep apnea) 07/03/2018   Excessive  daytime sleepiness 07/03/2018   Class 3 severe obesity due to excess calories with serious comorbidity and body mass index (BMI) of 40.0 to 44.9 in adult [facility]) 07/03/2018    Non-seasonal allergic rhinitis due to pollen 07/03/2018   Anxiety disorder 06/05/2018   Chronic rhinitis 03/10/2018   Acute embolism and thrombosis of unspecified deep veins of right lower extremity (HCC) 02/19/2018   Plantar fascial fibromatosis 10/22/2017   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   TIA (transient ischemic attack) 10/07/2017   Urticaria 06/27/2017   Long term (current) use of insulin (HCC) 06/17/2017   Plantar wart 03/13/2017   ED (erectile dysfunction) of organic origin 12/13/2016   Degeneration of lumbosacral intervertebral disc 10/31/2016   Tobacco user 10/02/2016   Adjustment disorder with depressed mood 07/30/2016   Diabetic peripheral neuropathy associated with type 2 diabetes mellitus (HCC) 07/30/2016   Essential hypertension 07/30/2016   Fatigue 07/30/2016   Lipoprotein deficiency disorder 07/30/2016   Umbilical hernia 07/30/2016   PCP:  Garlan Fillers, MD Pharmacy:   [facility] [id number] ([address]), [place] - [address] [place] ([place]) [place] [address] Phone: [phone] Fax: [phone]  [id number] - [place], [place] - [address] AT [facility] [address] [place] [address] Phone: [phone] Fax: [phone]     Social Determinants of Health (SDOH) Interventions    Readmission Risk Interventions No flowsheet data found.

## 2021-03-14 NOTE — Plan of Care (Signed)

## 2021-03-14 NOTE — Progress Notes (Signed)
Physical Therapy Treatment Patient Details Name: [name] MRN: [id number] DOB: 1976/02/05 Today's Date: 03/14/2021   History of Present Illness 46 y.o. male presents to [facility] hospital on 03/13/2021 for R THA. PMH includes anxiety, OA, chronic low back pain, HTN, scoliosis, CVA, DMII.    PT Comments    Pt is limited by pain this session. Pt tolerates increased ambulation distances, however demonstrates difficulty maintaining TDWB status during transfers and gait. Pt will benefit from continued aggressive mobilization in an effort to reduce falls risk and restore independence. Due to limited ability to clear LLE while maintaining TDWB RLE PT is concerned about the patient's ability to negotiate steps to enter his home.   Recommendations for follow up therapy are one component of a multi-disciplinary discharge planning process, led by the attending physician.  Recommendations may be updated based on patient status, additional functional criteria and insurance authorization.  Follow Up Recommendations  Follow physician's recommendations for discharge plan and follow up therapies     Assistance Recommended at Discharge Intermittent Supervision/Assistance  Patient can return home with the following A little help with walking and/or transfers;A little help with bathing/dressing/bathroom;Assistance with cooking/housework;Assist for transportation;Help with stairs or ramp for entrance   Equipment Recommendations  Rolling walker (2 wheels);BSC/3in1    Recommendations for Other Services       Precautions / Restrictions Precautions Precautions: Fall Precaution Comments: direct anterior THA Restrictions Weight Bearing Restrictions: Yes RLE Weight Bearing: Touchdown weight bearing RLE Partial Weight Bearing Percentage or Pounds: 20-25%     Mobility  Bed Mobility Overal bed mobility: Needs Assistance Bed Mobility: Supine to Sit     Supine to sit: Min guard;HOB elevated     General bed  mobility comments: verbal cues for technique, use of LLE to assist RLE, significant use of railing    Transfers Overall transfer level: Needs assistance Equipment used: Rolling walker (2 wheels) Transfers: Sit to/from Stand Sit to Stand: Min guard;From elevated surface           General transfer comment: pt with poor maintenance of WB precautions this session. Appears to utilize RLE to power up into standing with >20% body weight of force    Ambulation/Gait Ambulation/Gait assistance: Min guard Gait Distance (Feet): 20 Feet Assistive device: Rolling walker (2 wheels) Gait Pattern/deviations: Step-to pattern;Decreased step length - right;Decreased step length - left Gait velocity: reduced Gait velocity interpretation: <1.31 ft/sec, indicative of household ambulator   General Gait Details: pt with short step-to gait, intermittent increase in WB through RLE which becomes more frequent with fatigue. PT provides verbal cues throughout gait to maintain TDWB status. Pt with minimal L foot clearance with attempted hops/steps   Stairs             Wheelchair Mobility    Modified Rankin (Stroke Patients Only)       Balance Overall balance assessment: Needs assistance Sitting-balance support: Single extremity supported;Feet supported Sitting balance-Leahy Scale: Poor Sitting balance - Comments: pt leaning to left side, likely to offload R hip Postural control: Left lateral lean Standing balance support: Bilateral upper extremity supported;Reliant on assistive device for balance Standing balance-Leahy Scale: Poor                              Cognition Arousal/Alertness: Awake/alert Behavior During Therapy: WFL for tasks assessed/performed Overall Cognitive Status: Within Functional Limits for tasks assessed  Exercises      General Comments General comments (skin integrity, edema, etc.): pt on 1L [place],  sats in high-80s to low 90s      Pertinent Vitals/Pain Pain Assessment: 0-10 Pain Score: 10-Worst pain ever Breathing: normal Negative Vocalization: none Facial Expression: smiling or inexpressive Body Language: relaxed Consolability: no need to console PAINAD Score: 0 Pain Location: R hip Pain Descriptors / Indicators: Aching Pain Intervention(s): Monitored during session    Home Living Family/patient expects to be discharged to:: Private residence Living Arrangements: Children;Parent                      Prior Function            PT Goals (current goals can now be found in the care plan section) Acute Rehab PT Goals Patient Stated Goal: to reduce pain and return home Progress towards PT goals: Progressing toward goals (slowly)    Frequency    7X/week      PT Plan Current plan remains appropriate    Co-evaluation              AM-PAC PT "6 Clicks" Mobility   Outcome Measure  Help needed turning from your back to your side while in a flat bed without using bedrails?: A Little Help needed moving from lying on your back to sitting on the side of a flat bed without using bedrails?: A Little Help needed moving to and from a bed to a chair (including a wheelchair)?: A Little Help needed standing up from a chair using your arms (e.g., wheelchair or bedside chair)?: A Little Help needed to walk in hospital room?: A Little Help needed climbing 3-5 steps with a railing? : Total 6 Click Score: 16    End of Session   Activity Tolerance: Patient limited by pain Patient left: in chair;with call bell/phone within reach;with chair alarm set Nurse Communication: Mobility status PT Visit Diagnosis: Other abnormalities of gait and mobility (R26.89);Muscle weakness (generalized) (M62.81);Pain Pain - Right/Left: Right Pain - part of body: Hip     Time: 9604-5409 PT Time Calculation (min) (ACUTE ONLY): 27 min  Charges:  $Gait Training: 8-22  mins $Therapeutic Activity: 8-22 mins                     Arlyss Gandy, PT, DPT Acute Rehabilitation Pager: [phone] Office [phone]    Arlyss Gandy 03/14/2021, 2:59 PM

## 2021-03-14 NOTE — Evaluation (Signed)
Physical Therapy Evaluation Patient Details Name: [name] MRN: [id number] DOB: November 22, 1975 Today's Date: 03/14/2021  History of Present Illness  46 y.o. male presents to [facility] hospital on 03/13/2021 for R THA. PMH includes anxiety, OA, chronic low back pain, HTN, scoliosis, CVA, DMII.  Clinical Impression  Pt presents to PT with deficits in activity tolerance, gait, balance, power, endurance, functional mobility. Pt is able to transfer while maintaining WB precautions and initiate gait training. Pt is limited by R hip discomfort and will benefit form frequent mobility to aide in improving activity tolerance. Acute PT will continue to follow.       Recommendations for follow up therapy are one component of a multi-disciplinary discharge planning process, led by the attending physician.  Recommendations may be updated based on patient status, additional functional criteria and insurance authorization.  Follow Up Recommendations Follow physician's recommendations for discharge plan and follow up therapies    Assistance Recommended at Discharge Intermittent Supervision/Assistance  Patient can return home with the following  A little help with walking and/or transfers;A little help with bathing/dressing/bathroom;Assistance with cooking/housework;Assist for transportation;Help with stairs or ramp for entrance    Equipment Recommendations Rolling walker (2 wheels);BSC/3in1  Recommendations for Other Services       Functional Status Assessment Patient has had a recent decline in their functional status and demonstrates the ability to make significant improvements in function in a reasonable and predictable amount of time.     Precautions / Restrictions Precautions Precautions: Fall Precaution Comments: direct anterior THA Restrictions Weight Bearing Restrictions: Yes RLE Weight Bearing: Touchdown weight bearing      Mobility  Bed Mobility Overal bed mobility: Needs Assistance Bed  Mobility: Supine to Sit     Supine to sit: Min assist;HOB elevated     General bed mobility comments: PT hand hold to aide in elevating trunk into sitting    Transfers Overall transfer level: Needs assistance Equipment used: Rolling walker (2 wheels) Transfers: Sit to/from Stand Sit to Stand: Min guard           General transfer comment: pt with good maintenance of WB precautions. verbal cues for hand placement    Ambulation/Gait Ambulation/Gait assistance: Min guard Gait Distance (Feet): 4 Feet Assistive device: Rolling walker (2 wheels) Gait Pattern/deviations: Step-to pattern Gait velocity: reduced Gait velocity interpretation: <1.31 ft/sec, indicative of household ambulator   General Gait Details: pt with short step-to gait, reduced WB through RLE to maintain TDWB.  Stairs            Wheelchair Mobility    Modified Rankin (Stroke Patients Only)       Balance Overall balance assessment: Needs assistance Sitting-balance support: No upper extremity supported;Feet supported Sitting balance-Leahy Scale: Good     Standing balance support: Bilateral upper extremity supported;Reliant on assistive device for balance Standing balance-Leahy Scale: Poor                               Pertinent Vitals/Pain Pain Assessment: 0-10 Pain Score: 7  Pain Location: R hip Pain Descriptors / Indicators: Aching Pain Intervention(s): Premedicated before session    Home Living Family/patient expects to be discharged to:: Private residence Living Arrangements: Children;Parent Available Help at Discharge: Family;Available 24 hours/day Type of Home: House Home Access: Stairs to enter Entrance Stairs-Rails: None Entrance Stairs-Number of Steps: 3   Home Layout: Two level;Able to live on main level with bedroom/bathroom Home Equipment: None  Prior Function Prior Level of Function : Independent/Modified Independent                     Hand  Dominance        Extremity/Trunk Assessment   Upper Extremity Assessment Upper Extremity Assessment: Overall WFL for tasks assessed    Lower Extremity Assessment Lower Extremity Assessment: RLE deficits/detail RLE Deficits / Details: 3/5 ankle PF/DF, 3/5 knee flexion, 4-/5 knee extension, 3-/5 hip flexion/abduction based on observed mobility    Cervical / Trunk Assessment Cervical / Trunk Assessment: Normal  Communication   Communication: No difficulties  Cognition Arousal/Alertness: Awake/alert Behavior During Therapy: WFL for tasks assessed/performed Overall Cognitive Status: Within Functional Limits for tasks assessed                                          General Comments General comments (skin integrity, edema, etc.): pt on RA upon arrival, sats in mid 80s. With mobility pt sats improve to low 90s. Pt left on RA satting 92%    Exercises     Assessment/Plan    PT Assessment Patient needs continued PT services  PT Problem List Decreased strength;Decreased range of motion;Decreased activity tolerance;Decreased balance;Decreased mobility;Decreased knowledge of use of DME;Decreased knowledge of precautions;Pain       PT Treatment Interventions DME instruction;Gait training;Stair training;Functional mobility training;Therapeutic activities;Therapeutic exercise;Balance training;Patient/family education    PT Goals (Current goals can be found in the Care Plan section)  Acute Rehab PT Goals Patient Stated Goal: to reduce pain and return home PT Goal Formulation: With patient Time For Goal Achievement: 03/19/21 Potential to Achieve Goals: Good    Frequency 7X/week     Co-evaluation               AM-PAC PT "6 Clicks" Mobility  Outcome Measure Help needed turning from your back to your side while in a flat bed without using bedrails?: A Little Help needed moving from lying on your back to sitting on the side of a flat bed without using  bedrails?: A Little Help needed moving to and from a bed to a chair (including a wheelchair)?: A Little Help needed standing up from a chair using your arms (e.g., wheelchair or bedside chair)?: A Little Help needed to walk in hospital room?: A Little Help needed climbing 3-5 steps with a railing? : Total 6 Click Score: 16    End of Session   Activity Tolerance: Patient tolerated treatment well Patient left: in chair;with call bell/phone within reach;with chair alarm set Nurse Communication: Mobility status PT Visit Diagnosis: Other abnormalities of gait and mobility (R26.89);Muscle weakness (generalized) (M62.81);Pain Pain - Right/Left: Right Pain - part of body: Hip    Time: [id number] PT Time Calculation (min) (ACUTE ONLY): 28 min   Charges:   PT Evaluation $PT Eval Low Complexity: 1 Low          Zenaida Niece, PT, DPT Acute Rehabilitation Pager: [phone] Office [phone]   Zenaida Niece 03/14/2021, 10:50 AM

## 2021-03-14 NOTE — Discharge Instructions (Signed)
INSTRUCTIONS AFTER JOINT REPLACEMENT  ° °Remove items at home which could result in a fall. This includes throw rugs or furniture in walking pathways °ICE to the affected joint every three hours while awake for 30 minutes at a time, for at least the first 3-5 days, and then as needed for pain and swelling.  Continue to use ice for pain and swelling. You may notice swelling that will progress down to the foot and ankle.  This is normal after surgery.  Elevate your leg when you are not up walking on it.   °Continue to use the breathing machine you got in the hospital (incentive spirometer) which will help keep your temperature down.  It is common for your temperature to cycle up and down following surgery, especially at night when you are not up moving around and exerting yourself.  The breathing machine keeps your lungs expanded and your temperature down. ° ° °DIET:  As you were doing prior to hospitalization, we recommend a well-balanced diet. ° °DRESSING / WOUND CARE / SHOWERING ° °Keep the surgical dressing until follow up.  The dressing is water proof, so you can shower without any extra covering.  IF THE DRESSING FALLS OFF or the wound gets wet inside, change the dressing with sterile gauze.  Please use good hand washing techniques before changing the dressing.  Do not use any lotions or creams on the incision until instructed by your surgeon.   ° °ACTIVITY ° °Increase activity slowly as tolerated, but follow the weight bearing instructions below.   °No driving for 6 weeks or until further direction given by your physician.  You cannot drive while taking narcotics.  °No lifting or carrying greater than 10 lbs. until further directed by your surgeon. °Avoid periods of inactivity such as sitting longer than an hour when not asleep. This helps prevent blood clots.  °You may return to work once you are authorized by your doctor.  ° ° ° °WEIGHT BEARING  ° °Weight bearing as tolerated with assist device (walker, cane,  etc) as directed, use it as long as suggested by your surgeon or therapist, typically at least 4-6 weeks. ° ° °EXERCISES ° °Results after joint replacement surgery are often greatly improved when you follow the exercise, range of motion and muscle strengthening exercises prescribed by your doctor. Safety measures are also important to protect the joint from further injury. Any time any of these exercises cause you to have increased pain or swelling, decrease what you are doing until you are comfortable again and then slowly increase them. If you have problems or questions, call your caregiver or physical therapist for advice.  ° °Rehabilitation is important following a joint replacement. After just a few days of immobilization, the muscles of the leg can become weakened and shrink (atrophy).  These exercises are designed to build up the tone and strength of the thigh and leg muscles and to improve motion. Often times heat used for twenty to thirty minutes before working out will loosen up your tissues and help with improving the range of motion but do not use heat for the first two weeks following surgery (sometimes heat can increase post-operative swelling).  ° °These exercises can be done on a training (exercise) mat, on the floor, on a table or on a bed. Use whatever works the best and is most comfortable for you.    Use music or television while you are exercising so that the exercises are a pleasant break in your   day. This will make your life better with the exercises acting as a break in your routine that you can look forward to.   Perform all exercises about fifteen times, three times per day or as directed.  You should exercise both the operative leg and the other leg as well.  Exercises include:   Quad Sets - Tighten up the muscle on the front of the thigh (Quad) and hold for 5-10 seconds.   Straight Leg Raises - With your knee straight (if you were given a brace, keep it on), lift the leg to 60  degrees, hold for 3 seconds, and slowly lower the leg.  Perform this exercise against resistance later as your leg gets stronger.  Leg Slides: Lying on your back, slowly slide your foot toward your buttocks, bending your knee up off the floor (only go as far as is comfortable). Then slowly slide your foot back down until your leg is flat on the floor again.  Angel Wings: Lying on your back spread your legs to the side as far apart as you can without causing discomfort.  Hamstring Strength:  Lying on your back, push your heel against the floor with your leg straight by tightening up the muscles of your buttocks.  Repeat, but this time bend your knee to a comfortable angle, and push your heel against the floor.  You may put a pillow under the heel to make it more comfortable if necessary.   A rehabilitation program following joint replacement surgery can speed recovery and prevent re-injury in the future due to weakened muscles. Contact your doctor or a physical therapist for more information on knee rehabilitation.    CONSTIPATION  Constipation is defined medically as fewer than three stools per week and severe constipation as less than one stool per week.  Even if you have a regular bowel pattern at home, your normal regimen is likely to be disrupted due to multiple reasons following surgery.  Combination of anesthesia, postoperative narcotics, change in appetite and fluid intake all can affect your bowels.   YOU MUST use at least one of the following options; they are listed in order of increasing strength to get the job done.  They are all available over the counter, and you may need to use some, POSSIBLY even all of these options:    Drink plenty of fluids (prune juice may be helpful) and high fiber foods Colace 100 mg by mouth twice a day  Senokot for constipation as directed and as needed Dulcolax (bisacodyl), take with full glass of water  Miralax (polyethylene glycol) once or twice a day as  needed.  If you have tried all these things and are unable to have a bowel movement in the first 3-4 days after surgery call either your surgeon or your primary doctor.    If you experience loose stools or diarrhea, hold the medications until you stool forms back up.  If your symptoms do not get better within 1 week or if they get worse, check with your doctor.  If you experience "the worst abdominal pain ever" or develop nausea or vomiting, please contact the office immediately for further recommendations for treatment.   ITCHING:  If you experience itching with your medications, try taking only a single pain pill, or even half a pain pill at a time.  You can also use Benadryl over the counter for itching or also to help with sleep.   TED HOSE STOCKINGS:  Use stockings on both  legs until for at least 2 weeks or as directed by physician office. They may be removed at night for sleeping.  MEDICATIONS:  See your medication summary on the After Visit Summary that nursing will review with you.  You may have some home medications which will be placed on hold until you complete the course of blood thinner medication.  It is important for you to complete the blood thinner medication as prescribed.  PRECAUTIONS:  If you experience chest pain or shortness of breath - call 911 immediately for transfer to the hospital emergency department.   If you develop a fever greater that 101 F, purulent drainage from wound, increased redness or drainage from wound, foul odor from the wound/dressing, or calf pain - CONTACT YOUR SURGEON.                                                   FOLLOW-UP APPOINTMENTS:  If you do not already have a post-op appointment, please call the office for an appointment to be seen by your surgeon.  Guidelines for how soon to be seen are listed in your After Visit Summary, but are typically between 1-4 weeks after surgery.  OTHER INSTRUCTIONS:   Knee Replacement:  Do not place pillow  under knee, focus on keeping the knee straight while resting. CPM instructions: 0-90 degrees, 2 hours in the morning, 2 hours in the afternoon, and 2 hours in the evening. Place foam block, curve side up under heel at all times except when in CPM or when walking.  DO NOT modify, tear, cut, or change the foam block in any way.  POST-OPERATIVE OPIOID TAPER INSTRUCTIONS: It is important to wean off of your opioid medication as soon as possible. If you do not need pain medication after your surgery it is ok to stop day one. Opioids include: Codeine, Hydrocodone(Norco, Vicodin), Oxycodone(Percocet, oxycontin) and hydromorphone amongst others.  Long term and even short term use of opiods can cause: Increased pain response Dependence Constipation Depression Respiratory depression And more.  Withdrawal symptoms can include Flu like symptoms Nausea, vomiting And more Techniques to manage these symptoms Hydrate well Eat regular healthy meals Stay active Use relaxation techniques(deep breathing, meditating, yoga) Do Not substitute Alcohol to help with tapering If you have been on opioids for less than two weeks and do not have pain than it is ok to stop all together.  Plan to wean off of opioids This plan should start within one week post op of your joint replacement. Maintain the same interval or time between taking each dose and first decrease the dose.  Cut the total daily intake of opioids by one tablet each day Next start to increase the time between doses. The last dose that should be eliminated is the evening dose.   MAKE SURE YOU:  Understand these instructions.  Get help right away if you are not doing well or get worse.    Thank you for letting us be a part of your medical care team.  It is a privilege we respect greatly.  We hope these instructions will help you stay on track for a fast and full recovery!     INSTRUCTIONS AFTER JOINT REPLACEMENT   Remove items at home which  could result in a fall. This includes throw rugs or furniture in walking pathways ICE to the  affected joint every three hours while awake for 30 minutes at a time, for at least the first 3-5 days, and then as needed for pain and swelling.  Continue to use ice for pain and swelling. You may notice swelling that will progress down to the foot and ankle.  This is normal after surgery.  Elevate your leg when you are not up walking on it.   Continue to use the breathing machine you got in the hospital (incentive spirometer) which will help keep your temperature down.  It is common for your temperature to cycle up and down following surgery, especially at night when you are not up moving around and exerting yourself.  The breathing machine keeps your lungs expanded and your temperature down.   DIET:  As you were doing prior to hospitalization, we recommend a well-balanced diet.  DRESSING / WOUND CARE / SHOWERING  Keep the surgical dressing until follow up.  The dressing is water proof, so you can shower without any extra covering.  IF THE DRESSING FALLS OFF or the wound gets wet inside, change the dressing with sterile gauze.  Please use good hand washing techniques before changing the dressing.  Do not use any lotions or creams on the incision until instructed by your surgeon.    ACTIVITY  Increase activity slowly as tolerated, but follow the weight bearing instructions below.   No driving for 6 weeks or until further direction given by your physician.  You cannot drive while taking narcotics.  No lifting or carrying greater than 10 lbs. until further directed by your surgeon. Avoid periods of inactivity such as sitting longer than an hour when not asleep. This helps prevent blood clots.  You may return to work once you are authorized by your doctor.     WEIGHT BEARING   Partial weight bearing with assist device as directed.  Only touch down weight bearing   EXERCISES  Results after joint  replacement surgery are often greatly improved when you follow the exercise, range of motion and muscle strengthening exercises prescribed by your doctor. Safety measures are also important to protect the joint from further injury. Any time any of these exercises cause you to have increased pain or swelling, decrease what you are doing until you are comfortable again and then slowly increase them. If you have problems or questions, call your caregiver or physical therapist for advice.   Rehabilitation is important following a joint replacement. After just a few days of immobilization, the muscles of the leg can become weakened and shrink (atrophy).  These exercises are designed to build up the tone and strength of the thigh and leg muscles and to improve motion. Often times heat used for twenty to thirty minutes before working out will loosen up your tissues and help with improving the range of motion but do not use heat for the first two weeks following surgery (sometimes heat can increase post-operative swelling).   These exercises can be done on a training (exercise) mat, on the floor, on a table or on a bed. Use whatever works the best and is most comfortable for you.    Use music or television while you are exercising so that the exercises are a pleasant break in your day. This will make your life better with the exercises acting as a break in your routine that you can look forward to.   Perform all exercises about fifteen times, three times per day or as directed.  You should exercise  both the operative leg and the other leg as well.  Exercises include:   Quad Sets - Tighten up the muscle on the front of the thigh (Quad) and hold for 5-10 seconds.   Straight Leg Raises - With your knee straight (if you were given a brace, keep it on), lift the leg to 60 degrees, hold for 3 seconds, and slowly lower the leg.  Perform this exercise against resistance later as your leg gets stronger.  Leg Slides: Lying  on your back, slowly slide your foot toward your buttocks, bending your knee up off the floor (only go as far as is comfortable). Then slowly slide your foot back down until your leg is flat on the floor again.  Angel Wings: Lying on your back spread your legs to the side as far apart as you can without causing discomfort.  Hamstring Strength:  Lying on your back, push your heel against the floor with your leg straight by tightening up the muscles of your buttocks.  Repeat, but this time bend your knee to a comfortable angle, and push your heel against the floor.  You may put a pillow under the heel to make it more comfortable if necessary.   A rehabilitation program following joint replacement surgery can speed recovery and prevent re-injury in the future due to weakened muscles. Contact your doctor or a physical therapist for more information on knee rehabilitation.    CONSTIPATION  Constipation is defined medically as fewer than three stools per week and severe constipation as less than one stool per week.  Even if you have a regular bowel pattern at home, your normal regimen is likely to be disrupted due to multiple reasons following surgery.  Combination of anesthesia, postoperative narcotics, change in appetite and fluid intake all can affect your bowels.   YOU MUST use at least one of the following options; they are listed in order of increasing strength to get the job done.  They are all available over the counter, and you may need to use some, POSSIBLY even all of these options:    Drink plenty of fluids (prune juice may be helpful) and high fiber foods Colace 100 mg by mouth twice a day  Senokot for constipation as directed and as needed Dulcolax (bisacodyl), take with full glass of water  Miralax (polyethylene glycol) once or twice a day as needed.  If you have tried all these things and are unable to have a bowel movement in the first 3-4 days after surgery call either your surgeon or  your primary doctor.    If you experience loose stools or diarrhea, hold the medications until you stool forms back up.  If your symptoms do not get better within 1 week or if they get worse, check with your doctor.  If you experience "the worst abdominal pain ever" or develop nausea or vomiting, please contact the office immediately for further recommendations for treatment.   ITCHING:  If you experience itching with your medications, try taking only a single pain pill, or even half a pain pill at a time.  You can also use Benadryl over the counter for itching or also to help with sleep.   TED HOSE STOCKINGS:  Use stockings on both legs until for at least 2 weeks or as directed by physician office. They may be removed at night for sleeping.  MEDICATIONS:  See your medication summary on the After Visit Summary that nursing will review with you.  You may  have some home medications which will be placed on hold until you complete the course of blood thinner medication.  It is important for you to complete the blood thinner medication as prescribed.  PRECAUTIONS:  If you experience chest pain or shortness of breath - call 911 immediately for transfer to the hospital emergency department.   If you develop a fever greater that 101 F, purulent drainage from wound, increased redness or drainage from wound, foul odor from the wound/dressing, or calf pain - CONTACT YOUR SURGEON.                                                   FOLLOW-UP APPOINTMENTS:  If you do not already have a post-op appointment, please call the office for an appointment to be seen by your surgeon.  Guidelines for how soon to be seen are listed in your After Visit Summary, but are typically between 1-4 weeks after surgery.  OTHER INSTRUCTIONS:   Knee Replacement:  Do not place pillow under knee, focus on keeping the knee straight while resting. CPM instructions: 0-90 degrees, 2 hours in the morning, 2 hours in the afternoon, and 2  hours in the evening. Place foam block, curve side up under heel at all times except when in CPM or when walking.  DO NOT modify, tear, cut, or change the foam block in any way.  POST-OPERATIVE OPIOID TAPER INSTRUCTIONS: It is important to wean off of your opioid medication as soon as possible. If you do not need pain medication after your surgery it is ok to stop day one. Opioids include: Codeine, Hydrocodone(Norco, Vicodin), Oxycodone(Percocet, oxycontin) and hydromorphone amongst others.  Long term and even short term use of opiods can cause: Increased pain response Dependence Constipation Depression Respiratory depression And more.  Withdrawal symptoms can include Flu like symptoms Nausea, vomiting And more Techniques to manage these symptoms Hydrate well Eat regular healthy meals Stay active Use relaxation techniques(deep breathing, meditating, yoga) Do Not substitute Alcohol to help with tapering If you have been on opioids for less than two weeks and do not have pain than it is ok to stop all together.  Plan to wean off of opioids This plan should start within one week post op of your joint replacement. Maintain the same interval or time between taking each dose and first decrease the dose.  Cut the total daily intake of opioids by one tablet each day Next start to increase the time between doses. The last dose that should be eliminated is the evening dose.   MAKE SURE YOU:  Understand these instructions.  Get help right away if you are not doing well or get worse.    Thank you for letting us be a part of your medical care team.  It is a privilege we respect greatly.  We hope these instructions will help you stay on track for a fast and full recovery!

## 2021-03-15 DIAGNOSIS — M1611 Unilateral primary osteoarthritis, right hip: Secondary | ICD-10-CM | POA: Diagnosis present

## 2021-03-15 DIAGNOSIS — E7800 Pure hypercholesterolemia, unspecified: Secondary | ICD-10-CM | POA: Diagnosis present

## 2021-03-15 DIAGNOSIS — G4733 Obstructive sleep apnea (adult) (pediatric): Secondary | ICD-10-CM | POA: Diagnosis present

## 2021-03-15 DIAGNOSIS — Z888 Allergy status to other drugs, medicaments and biological substances status: Secondary | ICD-10-CM | POA: Diagnosis not present

## 2021-03-15 DIAGNOSIS — Z9079 Acquired absence of other genital organ(s): Secondary | ICD-10-CM | POA: Diagnosis not present

## 2021-03-15 DIAGNOSIS — Z8249 Family history of ischemic heart disease and other diseases of the circulatory system: Secondary | ICD-10-CM | POA: Diagnosis not present

## 2021-03-15 DIAGNOSIS — E1142 Type 2 diabetes mellitus with diabetic polyneuropathy: Secondary | ICD-10-CM | POA: Diagnosis present

## 2021-03-15 DIAGNOSIS — Z6841 Body Mass Index (BMI) 40.0 and over, adult: Secondary | ICD-10-CM | POA: Diagnosis not present

## 2021-03-15 DIAGNOSIS — I10 Essential (primary) hypertension: Secondary | ICD-10-CM | POA: Diagnosis present

## 2021-03-15 DIAGNOSIS — Z87891 Personal history of nicotine dependence: Secondary | ICD-10-CM | POA: Diagnosis not present

## 2021-03-15 DIAGNOSIS — J3089 Other allergic rhinitis: Secondary | ICD-10-CM | POA: Diagnosis present

## 2021-03-15 DIAGNOSIS — G4700 Insomnia, unspecified: Secondary | ICD-10-CM | POA: Diagnosis present

## 2021-03-15 DIAGNOSIS — Z8673 Personal history of transient ischemic attack (TIA), and cerebral infarction without residual deficits: Secondary | ICD-10-CM | POA: Diagnosis not present

## 2021-03-15 DIAGNOSIS — Z86718 Personal history of other venous thrombosis and embolism: Secondary | ICD-10-CM | POA: Diagnosis not present

## 2021-03-15 DIAGNOSIS — Z8614 Personal history of Methicillin resistant Staphylococcus aureus infection: Secondary | ICD-10-CM | POA: Diagnosis not present

## 2021-03-15 DIAGNOSIS — G8929 Other chronic pain: Secondary | ICD-10-CM | POA: Diagnosis present

## 2021-03-15 DIAGNOSIS — Z981 Arthrodesis status: Secondary | ICD-10-CM | POA: Diagnosis not present

## 2021-03-15 LAB — GLUCOSE, CAPILLARY
Glucose-Capillary: 151 mg/dL — ABNORMAL HIGH (ref 70–99)
Glucose-Capillary: 168 mg/dL — ABNORMAL HIGH (ref 70–99)
Glucose-Capillary: 223 mg/dL — ABNORMAL HIGH (ref 70–99)
Glucose-Capillary: 204 mg/dL — ABNORMAL HIGH (ref 70–99)

## 2021-03-15 NOTE — Progress Notes (Signed)
Physical Therapy Treatment Patient Details Name: [name] MRN: [id number] DOB: 1975-05-16 Today's Date: 03/15/2021   History of Present Illness 46 y.o. male presents to [facility] hospital on 03/13/2021 for R THA. PMH includes anxiety, OA, chronic low back pain, HTN, scoliosis, CVA, DMII.    PT Comments    Pt was seen for gait and transfers in BR to practice on commode.  Pt is concerned about his lack of bowel movement yet and passed this along to nursing.  Pt is in a lot of pain but was medicated for therapy session.  Note his balance and safety issues that persist, and recommend he have home therapy to follow along with him when PT is discharged.  Should have assistance there to manage WB restriction, as he required reminders during the session to maintain it.  HHPT for follow up to work on RLE strength and balance changes, as well as safety with gait due to proprioceptive changes on R hip.   Recommendations for follow up therapy are one component of a multi-disciplinary discharge planning process, led by the attending physician.  Recommendations may be updated based on patient status, additional functional criteria and insurance authorization.  Follow Up Recommendations  Follow physician's recommendations for discharge plan and follow up therapies     Assistance Recommended at Discharge Intermittent Supervision/Assistance  Patient can return home with the following A little help with walking and/or transfers;A little help with bathing/dressing/bathroom;Assistance with cooking/housework;Assist for transportation;Help with stairs or ramp for entrance   Equipment Recommendations  Rolling walker (2 wheels);BSC/3in1    Recommendations for Other Services Rehab consult     Precautions / Restrictions Precautions Precautions: Fall Precaution Comments: direct anterior THA Restrictions Weight Bearing Restrictions: Yes RLE Weight Bearing: Partial weight bearing RLE Partial Weight Bearing Percentage  or Pounds: 20-25%     Mobility  Bed Mobility Overal bed mobility: Needs Assistance Bed Mobility: Sidelying to Sit;Sit to Supine   Sidelying to sit: Min assist   Sit to supine: Min assist   General bed mobility comments: used all bed features and assisting RLE directly to reduce pain    Transfers Overall transfer level: Needs assistance Equipment used: Rolling walker (2 wheels) Transfers: Sit to/from Stand Sit to Stand: Min assist;From elevated surface           General transfer comment: increased assist but likely from pain    Ambulation/Gait Ambulation/Gait assistance: Min guard Gait Distance (Feet): 40 Feet Assistive device: Rolling walker (2 wheels) Gait Pattern/deviations: Step-through pattern;Step-to pattern;Decreased stride length;Decreased stance time - right Gait velocity: reduced Gait velocity interpretation: <1.31 ft/sec, indicative of household ambulator Pre-gait activities: standing balance control and wgt shift General Gait Details: step to and finally step through as he relaxed R hip during gait, somewhat relieving   Stairs             Wheelchair Mobility    Modified Rankin (Stroke Patients Only)       Balance Overall balance assessment: Needs assistance Sitting-balance support: Feet supported Sitting balance-Leahy Scale: Fair     Standing balance support: Bilateral upper extremity supported;During functional activity Standing balance-Leahy Scale: Poor Standing balance comment: balance fair- by end of session with cues for offsetting balance on walker with PWB                            Cognition Arousal/Alertness: Awake/alert Behavior During Therapy: WFL for tasks assessed/performed Overall Cognitive Status: Within Functional Limits for tasks assessed  Exercises      General Comments General comments (skin integrity, edema, etc.): instructions for use of  walker with WB limitation and then to stand with commode seat, weak and painful R hip      Pertinent Vitals/Pain Pain Assessment: 0-10 Pain Score: 8  Breathing: occasional labored breathing, short period of hyperventilation Negative Vocalization: none Facial Expression: sad, frightened, frown Body Language: tense, distressed pacing, fidgeting Consolability: no need to console PAINAD Score: 3 Pain Location: R hip Pain Descriptors / Indicators: Grimacing;Guarding;Spasm Pain Intervention(s): Limited activity within patient's tolerance;Monitored during session;Premedicated before session;Repositioned;Ice applied    Home Living                          Prior Function            PT Goals (current goals can now be found in the care plan section) Acute Rehab PT Goals Patient Stated Goal: get pain down Progress towards PT goals: Progressing toward goals    Frequency    7X/week      PT Plan Current plan remains appropriate    Co-evaluation              AM-PAC PT "6 Clicks" Mobility   Outcome Measure  Help needed turning from your back to your side while in a flat bed without using bedrails?: A Lot Help needed moving from lying on your back to sitting on the side of a flat bed without using bedrails?: A Lot Help needed moving to and from a bed to a chair (including a wheelchair)?: A Lot Help needed standing up from a chair using your arms (e.g., wheelchair or bedside chair)?: A Lot Help needed to walk in hospital room?: A Lot Help needed climbing 3-5 steps with a railing? : Total 6 Click Score: 11    End of Session Equipment Utilized During Treatment: Gait belt;Oxygen Activity Tolerance: Patient limited by pain;Treatment limited secondary to medical complications (Comment) Patient left: in bed;with call bell/phone within reach;with bed alarm set Nurse Communication: Mobility status PT Visit Diagnosis: Other abnormalities of gait and mobility  (R26.89);Muscle weakness (generalized) (M62.81);Pain Pain - Right/Left: Right Pain - part of body: Hip     Time: 9562-1308 PT Time Calculation (min) (ACUTE ONLY): 35 min  Charges:  $Gait Training: 8-22 mins $Therapeutic Activity: 8-22 mins           Ivar Drape 03/15/2021, 8:27 PM  Samul Dada, PT PhD Acute Rehab Dept. Number: [facility] [id number] and [facility] [phone]

## 2021-03-15 NOTE — Progress Notes (Signed)
Subjective: 2 Days Post-Op Procedure(s) (LRB): Right TOTAL HIP ARTHROPLASTY ANTERIOR APPROACH (Right) Right Hip HARDWARE REMOVAL (Right) Patient reports pain as moderate.  Still limited mobility due to the nature of his surgery combined with his weight bearing restrictions.  Therapy has also recommended he stay another day.    Objective: Vital signs in last 24 hours: Temp:  [98.6 F (37 C)-99.3 F (37.4 C)] 99 F (37.2 C) (01/12 0826) Pulse Rate:  [82-98] 91 (01/12 0826) Resp:  [16-20] 20 (01/12 0826) BP: (108-140)/(71-85) 123/72 (01/12 0826) SpO2:  [90 %-94 %] 93 % (01/12 0826)  Intake/Output from previous day: 01/11 0701 - 01/12 0700 In: 1469.9 [P.O.:240; I.V.:1229.9] Out: 700 [Urine:700] Intake/Output this shift: No intake/output data recorded.  Recent Labs    03/14/21 0249  HGB 11.7*   Recent Labs    03/14/21 0249  WBC 11.5*  RBC 3.69*  HCT 34.8*  PLT 118*   Recent Labs    03/14/21 0249  NA 137  K 4.9  CL 107  CO2 21*  BUN 18  CREATININE 0.89  GLUCOSE 176*  CALCIUM 7.7*   No results for input(s): LABPT, INR in the last 72 hours.  Sensation intact distally Intact pulses distally Dorsiflexion/Plantar flexion intact Incision: scant drainage   Assessment/Plan: 2 Days Post-Op Procedure(s) (LRB): Right TOTAL HIP ARTHROPLASTY ANTERIOR APPROACH (Right) Right Hip HARDWARE REMOVAL (Right) Up with therapy Plan for discharge tomorrow Discharge home with home health Will change to Inpatient Admission. Encourage mobility and incentive spirometer.     Kathryne Hitch 03/15/2021, 9:20 AM

## 2021-03-15 NOTE — Plan of Care (Signed)
°  Problem: Safety: Goal: Ability to remain free from injury will improve Outcome: Progressing   Problem: Education: Goal: Knowledge of the prescribed therapeutic regimen will improve Outcome: Progressing   Problem: Activity: Goal: Ability to avoid complications of mobility impairment will improve Outcome: Progressing   Problem: Clinical Measurements: Goal: Postoperative complications will be avoided or minimized Outcome: Progressing   Problem: Pain Management: Goal: Pain level will decrease with appropriate interventions Outcome: Progressing   Problem: Skin Integrity: Goal: Will show signs of wound healing Outcome: Progressing

## 2021-03-15 NOTE — Plan of Care (Signed)
Problem: Education: Goal: Knowledge of General Education information will improve Description: Including pain rating scale, medication(s)/side effects and non-pharmacologic comfort measures 03/15/2021 1355 by Marsh Dolly, RN Outcome: Progressing 03/15/2021 0807 by Marsh Dolly, RN Outcome: Progressing   Problem: Health Behavior/Discharge Planning: Goal: Ability to manage health-related needs will improve 03/15/2021 1355 by Marsh Dolly, RN Outcome: Progressing 03/15/2021 0807 by Marsh Dolly, RN Outcome: Progressing   Problem: Clinical Measurements: Goal: Ability to maintain clinical measurements within normal limits will improve 03/15/2021 1355 by Marsh Dolly, RN Outcome: Progressing 03/15/2021 0807 by Marsh Dolly, RN Outcome: Progressing Goal: Will remain free from infection 03/15/2021 1355 by Marsh Dolly, RN Outcome: Progressing 03/15/2021 0807 by Marsh Dolly, RN Outcome: Progressing Goal: Diagnostic test results will improve 03/15/2021 1355 by Marsh Dolly, RN Outcome: Progressing 03/15/2021 0807 by Marsh Dolly, RN Outcome: Progressing Goal: Respiratory complications will improve 03/15/2021 1355 by Marsh Dolly, RN Outcome: Progressing 03/15/2021 0807 by Marsh Dolly, RN Outcome: Progressing Goal: Cardiovascular complication will be avoided 03/15/2021 1355 by Marsh Dolly, RN Outcome: Progressing 03/15/2021 0807 by Marsh Dolly, RN Outcome: Progressing   Problem: Activity: Goal: Risk for activity intolerance will decrease 03/15/2021 1355 by Marsh Dolly, RN Outcome: Progressing 03/15/2021 0807 by Marsh Dolly, RN Outcome: Progressing   Problem: Nutrition: Goal: Adequate nutrition will be maintained 03/15/2021 1355 by Marsh Dolly, RN Outcome: Progressing 03/15/2021 0807 by Marsh Dolly, RN Outcome: Progressing   Problem: Coping: Goal: Level of anxiety will decrease 03/15/2021 1355 by Marsh Dolly, RN Outcome:  Progressing 03/15/2021 0807 by Marsh Dolly, RN Outcome: Progressing   Problem: Elimination: Goal: Will not experience complications related to bowel motility 03/15/2021 1355 by Marsh Dolly, RN Outcome: Progressing 03/15/2021 0807 by Marsh Dolly, RN Outcome: Progressing Goal: Will not experience complications related to urinary retention 03/15/2021 1355 by Marsh Dolly, RN Outcome: Progressing 03/15/2021 0807 by Marsh Dolly, RN Outcome: Progressing   Problem: Pain Managment: Goal: General experience of comfort will improve 03/15/2021 1355 by Marsh Dolly, RN Outcome: Progressing 03/15/2021 0807 by Marsh Dolly, RN Outcome: Progressing   Problem: Safety: Goal: Ability to remain free from injury will improve 03/15/2021 1355 by Marsh Dolly, RN Outcome: Progressing 03/15/2021 0807 by Marsh Dolly, RN Outcome: Progressing   Problem: Skin Integrity: Goal: Risk for impaired skin integrity will decrease 03/15/2021 1355 by Marsh Dolly, RN Outcome: Progressing 03/15/2021 0807 by Marsh Dolly, RN Outcome: Progressing   Problem: Education: Goal: Knowledge of the prescribed therapeutic regimen will improve 03/15/2021 1355 by Marsh Dolly, RN Outcome: Progressing 03/15/2021 0807 by Marsh Dolly, RN Outcome: Progressing Goal: Understanding of discharge needs will improve 03/15/2021 1355 by Marsh Dolly, RN Outcome: Progressing 03/15/2021 0807 by Marsh Dolly, RN Outcome: Progressing Goal: Individualized Educational Video(s) 03/15/2021 1355 by Marsh Dolly, RN Outcome: Progressing 03/15/2021 0807 by Marsh Dolly, RN Outcome: Progressing   Problem: Activity: Goal: Ability to avoid complications of mobility impairment will improve 03/15/2021 1355 by Marsh Dolly, RN Outcome: Progressing 03/15/2021 0807 by Marsh Dolly, RN Outcome: Progressing Goal: Ability to tolerate increased activity will improve 03/15/2021 1355 by Marsh Dolly, RN Outcome: Progressing 03/15/2021 0807 by Marsh Dolly, RN Outcome: Progressing   Problem: Clinical Measurements: Goal: Postoperative complications will be avoided or minimized 03/15/2021 1355 by Marsh Dolly, RN Outcome: Progressing 03/15/2021 0807 by Avie Echevaria  H, RN Outcome: Progressing   Problem: Pain Management: Goal: Pain level will decrease with appropriate interventions 03/15/2021 1355 by Marsh Dolly, RN Outcome: Progressing 03/15/2021 0807 by Marsh Dolly, RN Outcome: Progressing   Problem: Skin Integrity: Goal: Will show signs of wound healing 03/15/2021 1355 by Marsh Dolly, RN Outcome: Progressing 03/15/2021 0807 by Marsh Dolly, RN Outcome: Progressing

## 2021-03-15 NOTE — TOC Initial Note (Addendum)
Transition of Care [facility]) - Initial/Assessment Note    Patient Details  Name: [name] MRN: [id number] Date of Birth: May 31, 1975  [facility] CM/SW Contact:    Epifanio Lesches, RN Phone Number: 03/15/2021, 2:50 PM  Clinical Narrative:                  -s/p R TOTAL HIP ARTHROPLASTY, 1/10  Resides with family. States independent with ADL's , no DME usage. NCM spoke with pt regarding TOC needs. Order noted for home health PT. Pt agreeable to home health services. Choice offered. Pt without  preference. Referral made with [facility] and [facility] unable accept 2/2 staffing. Referral made with [facility] , acceptance pending.  DME needs: rolling walker and 3in1/BSC, referral made with [facility]. Equipment will be delivered to bedside prior to d/c.  Pt without Rx med  concerns. Family to provide transportation home once d/c ready.  TOC following and will assist with TOC needs.  03/16/2021  [facility] accepted for home health services    Expected Discharge Plan: Home w Home Health Services Barriers to Discharge: Continued Medical Work up     Patient Goals and CMS Choice     Choice offered to / list presented to : Patient  Expected Discharge Plan and Services Expected Discharge Plan: Home w Home Health Services   Discharge Planning Services: CM Consult                     DME Arranged: 3-N-1, Walker rolling DME Agency: [facility] Date DME Agency Contacted: 03/14/21 Time DME Agency Contacted: [phone] Representative spoke with at DME Agency: Velna Hatchet HH Arranged: PT HH Agency: [facility] Date HH Agency Contacted: 03/14/21 Time HH Agency Contacted: [phone] Representative spoke with at [facility] Agency: Pearson Grippe  Prior Living Arrangements/Services   Lives with:: Parents, Minor Children, Adult Children Patient language and need for interpreter reviewed:: Yes Do you feel safe going back to the place where you live?: Yes      Need for Family  Participation in Patient Care: Yes (Comment) Care giver support system in place?: Yes (comment) Current home services: DME (cane) Criminal Activity/Legal Involvement Pertinent to Current Situation/Hospitalization: No - Comment as needed  Activities of Daily Living Home Assistive Devices/Equipment: Eyeglasses, CBG Meter, CPAP, Cane (specify quad or straight) ADL Screening (condition at time of admission) Patient's cognitive ability adequate to safely complete daily activities?: Yes Is the patient deaf or have difficulty hearing?: No Does the patient have difficulty seeing, even when wearing glasses/contacts?: No Does the patient have difficulty concentrating, remembering, or making decisions?: No Patient able to express need for assistance with ADLs?: Yes Does the patient have difficulty dressing or bathing?: Yes Independently performs ADLs?: Yes (appropriate for developmental age) Does the patient have difficulty walking or climbing stairs?: Yes (fresh post op) Weakness of Legs: Right Weakness of Arms/Hands: None  Permission Sought/Granted   Permission granted to share information with : Yes, Verbal Permission Granted              Emotional Assessment Appearance:: Appears stated age Attitude/Demeanor/Rapport: Engaged Affect (typically observed): Accepting Orientation: : Oriented to Self, Oriented to Place, Oriented to  Time, Oriented to Situation Alcohol / Substance Use: Not Applicable Psych Involvement: No (comment)  Admission diagnosis:  Status post total replacement of right hip [Z96.641] Patient Active Problem List   Diagnosis Date Noted   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/01/2020   Unilateral  primary osteoarthritis, right hip 10/28/2019   Unilateral primary osteoarthritis, left hip 10/28/2019   Retained orthopedic hardware 10/28/2019   Pain in right leg 09/09/2019   Shoulder joint pain 09/09/2019   Insomnia 07/20/2019    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   Neck pain 03/11/2019   Encounter for general adult medical examination without abnormal findings 07/10/2018   Peripheral vascular disease (HCC) 07/10/2018   Thrombocytopenia, unspecified (HCC) 07/03/2018   Type 2 diabetes mellitus without complication (HCC) 07/03/2018   OSA (obstructive sleep apnea) 07/03/2018   Excessive daytime sleepiness 07/03/2018   Class 3 severe obesity due to excess calories with serious comorbidity and body mass index (BMI) of 40.0 to 44.9 in adult (HCC) 07/03/2018   Non-seasonal allergic rhinitis due to pollen 07/03/2018   Anxiety disorder 06/05/2018   Chronic rhinitis 03/10/2018   Acute embolism and thrombosis of unspecified deep veins of right lower extremity (HCC) 02/19/2018   Plantar fascial fibromatosis 10/22/2017   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   TIA (transient ischemic attack) 10/07/2017   Urticaria 06/27/2017   Long term (current) use of insulin (HCC) 06/17/2017   Plantar wart 03/13/2017   ED (erectile dysfunction) of organic origin 12/13/2016   Degeneration of lumbosacral intervertebral disc 10/31/2016   Tobacco user 10/02/2016   Adjustment disorder with depressed mood 07/30/2016   Diabetic peripheral neuropathy associated with type 2 diabetes mellitus (HCC) 07/30/2016   Essential hypertension 07/30/2016   Fatigue 07/30/2016   Lipoprotein deficiency disorder 07/30/2016   Umbilical hernia 07/30/2016   PCP:  Garlan Fillers, MD Pharmacy:   [id number] - [address]), [place] - [address] [place] ([place]) [place] [address] Phone: [phone] Fax: [phone]  [id number] - [place], [place] - [address] AT [facility] [address] [place] [address] Phone: [phone] Fax: [phone]     Social Determinants of Health (SDOH) Interventions    Readmission Risk Interventions No  flowsheet data found.

## 2021-03-15 NOTE — Progress Notes (Signed)
Pt has home CPAP at bedside. States is able to place when ready for bed. Advised pt to notify for RT if any further assistance is needed.

## 2021-03-15 NOTE — Evaluation (Signed)
Occupational Therapy Evaluation Patient Details Name: [name] MRN: [id number] DOB: [birth year] Today's Date: 03/15/2021   History of Present Illness 46 y.o. male presents to [facility] hospital on 03/13/2021 for R THA. PMH includes anxiety, OA, chronic low back pain, HTN, scoliosis, CVA, DMII.   Clinical Impression   Patient admitted for the procedure above.  Pain to his R leg is the primary barrier, he appears to be doing better with maintenance of PWB, but it is still a struggle.  Patient would like one more day in the acute setting to practice prior to returning home.  OT will follow in the acute setting.  HH PT versus outpatient PT can be determined via acute PT if necessary.  No HH OT anticipated.        Recommendations for follow up therapy are one component of a multi-disciplinary discharge planning process, led by the attending physician.  Recommendations may be updated based on patient status, additional functional criteria and insurance authorization.   Follow Up Recommendations  No OT follow up    Assistance Recommended at Discharge Intermittent Supervision/Assistance  Patient can return home with the following      Functional Status Assessment  Patient has had a recent decline in their functional status and demonstrates the ability to make significant improvements in function in a reasonable and predictable amount of time.  Equipment Recommendations  BSC/3in1    Recommendations for Other Services       Precautions / Restrictions Precautions Precautions: Fall Precaution Comments: direct anterior THA Restrictions Weight Bearing Restrictions: Yes RLE Weight Bearing: Partial weight bearing Other Position/Activity Restrictions: 25% WBS RLE      Mobility Bed Mobility Overal bed mobility: Needs Assistance Bed Mobility: Sidelying to Sit;Sit to Supine     Supine to sit: Min guard;HOB elevated Sit to supine: Min assist        Transfers Overall transfer level: Needs  assistance Equipment used: Rolling walker (2 wheels) Transfers: Sit to/from Stand Sit to Stand: Min guard;From elevated surface           General transfer comment: better with leaning to LLE for sit to stand.      Balance Overall balance assessment: Needs assistance Sitting-balance support: Single extremity supported;Feet supported Sitting balance-Leahy Scale: Fair   Postural control: Left lateral lean Standing balance support: Bilateral upper extremity supported;Reliant on assistive device for balance Standing balance-Leahy Scale: Poor                             ADL either performed or assessed with clinical judgement   ADL       Grooming: Wash/dry hands;Wash/dry face;Set up;Sitting               Lower Body Dressing: Minimal assistance;Sit to/from stand   Toilet Transfer: Min guard;Ambulation;Regular Social worker and Hygiene: Supervision/safety;Sitting/lateral lean               Vision Baseline Vision/History: 1 Wears glasses Patient Visual Report: No change from baseline       Perception Perception Perception: Not tested   Praxis Praxis Praxis: Not tested    Pertinent Vitals/Pain Pain Assessment: Faces Faces Pain Scale: Hurts even more Pain Location: R hip Pain Descriptors / Indicators: Aching;Tightness;Grimacing Pain Intervention(s): Monitored during session;Premedicated before session     Hand Dominance Right   Extremity/Trunk Assessment Upper Extremity Assessment Upper Extremity Assessment: Overall WFL for tasks assessed   Lower Extremity Assessment Lower  Extremity Assessment: Defer to PT evaluation   Cervical / Trunk Assessment Cervical / Trunk Assessment: Normal   Communication Communication Communication: No difficulties   Cognition Arousal/Alertness: Awake/alert Behavior During Therapy: WFL for tasks assessed/performed Overall Cognitive Status: Within Functional Limits for tasks  assessed                                       General Comments   VSS on RA    Exercises     Shoulder Instructions      Home Living Family/patient expects to be discharged to:: Private residence Living Arrangements: Children;Parent Available Help at Discharge: Family;Available 24 hours/day Type of Home: House Home Access: Stairs to enter [employer] of Steps: 3 Entrance Stairs-Rails: None Home Layout: Two level;Able to live on main level with bedroom/bathroom     Bathroom Shower/Tub: Tub/shower unit         Home Equipment: None          Prior Functioning/Environment Prior Level of Function : Independent/Modified Independent                        OT Problem List: Decreased strength;Decreased activity tolerance;Impaired balance (sitting and/or standing);Pain      OT Treatment/Interventions: Self-care/ADL training;Therapeutic activities;Patient/family education;DME and/or AE instruction;Balance training    OT Goals(Current goals can be found in the care plan section) Acute Rehab OT Goals Patient Stated Goal: hurt less, move better OT Goal Formulation: With patient Time For Goal Achievement: 03/29/21 Potential to Achieve Goals: Good ADL Goals Pt Will Perform Grooming: with modified independence;standing Pt Will Perform Lower Body Dressing: with modified independence;with adaptive equipment;sit to/from stand Pt Will Transfer to Toilet: with modified independence;ambulating;regular height toilet Pt Will Perform Toileting - Clothing Manipulation and hygiene: with modified independence;sit to/from stand  OT Frequency: Min 2X/week    Co-evaluation              AM-PAC OT "6 Clicks" Daily Activity     Outcome Measure Help from another person eating meals?: None Help from another person taking care of personal grooming?: None Help from another person toileting, which includes using toliet, bedpan, or urinal?: A Little Help  from another person bathing (including washing, rinsing, drying)?: A Little Help from another person to put on and taking off regular upper body clothing?: None Help from another person to put on and taking off regular lower body clothing?: A Little 6 Click Score: 21   End of Session Equipment Utilized During Treatment: Gait belt;Rolling walker (2 wheels) Nurse Communication: Mobility status  Activity Tolerance: Patient tolerated treatment well Patient left: in bed;with call bell/phone within reach  OT Visit Diagnosis: Unsteadiness on feet (R26.81);Pain Pain - Right/Left: Right Pain - part of body: Leg                Time: 0810-0858 OT Time Calculation (min): 48 min Charges:  OT General Charges $OT Visit: 1 Visit OT Evaluation $OT Eval Moderate Complexity: 1 Mod OT Treatments $Self Care/Home Management : 23-37 mins  03/15/2021  RP, OTR/L  Acute Rehabilitation Services  Office:  [phone]   Suzanna Obey 03/15/2021, 9:07 AM

## 2021-03-15 NOTE — Progress Notes (Signed)
Mobility Specialist Progress Note   03/15/21 1200  Mobility  Activity Ambulated in room  Level of Assistance Minimal assist, patient does 75% or more  Assistive Device Front wheel walker  RLE Weight Bearing TWB  RLE Partial Weight Bearing Percentage or Pounds 20-25  Distance Ambulated (ft) 35 ft  Mobility Ambulated with assistance in room  Mobility Response Tolerated well  Mobility performed by Mobility specialist  $Mobility charge 1 Mobility   Pt received in bed on 1LO2 via [place] and agreeable to mobility. C/o R hip pain that was rated 8/10, but pt requesting pain meds after session. Min A supine <> EOB for trunk support but standby for the remainder of session. Maintaining precautions fairly well requiring min cues initially and at the end of session d/t fatigue. Pt ambulated w/ no O2 via [place] and remained >93% SpO2. Returned back to bed w/ all needs met, NT in the room and call bell in reach.  Holland Falling Mobility Specialist Phone Number [phone]

## 2021-03-15 NOTE — Progress Notes (Signed)
Physical Therapy Treatment Patient Details Name: [name] MRN: [id number] DOB: [birth year] Today's Date: 03/15/2021   History of Present Illness 46 y.o. male presents to [facility] on 03/13/2021 for R THA. PMH includes anxiety, OA, chronic low back pain, HTN, scoliosis, CVA, DMII.    PT Comments    Pt was seen for short session with there exercise to RLE and active movement on LLE.  Pt is able to demonstrate a better understanding for how to move RLE, and a handout was given with instructions for use toward home.  Will continue to progress the routine next session, and pt agrees to work on this plan.  Follow up for acute PT goals.   Recommendations for follow up therapy are one component of a multi-disciplinary discharge planning process, led by the attending physician.  Recommendations may be updated based on patient status, additional functional criteria and insurance authorization.  Follow Up Recommendations  Follow physician's recommendations for discharge plan and follow up therapies     Assistance Recommended at Discharge Intermittent Supervision/Assistance  Patient can return home with the following A little help with walking and/or transfers;A little help with bathing/dressing/bathroom;Assistance with cooking/housework;Assist for transportation;Help with stairs or ramp for entrance   Equipment Recommendations  Rolling walker (2 wheels);BSC/3in1    Recommendations for Other Services Rehab consult     Precautions / Restrictions Precautions Precautions: Fall Precaution Comments: direct anterior THA Restrictions Weight Bearing Restrictions: Yes RLE Weight Bearing: Partial weight bearing RLE Partial Weight Bearing Percentage or Pounds: 20-25%     Mobility  Bed Mobility Overal bed mobility: Needs Assistance Bed Mobility: Rolling Rolling: Min guard Sidelying to sit: Min assist   Sit to supine: Min assist   General bed mobility comments: requires bed rails to shift on  the bed    Transfers Overall transfer level: Needs assistance Equipment used: Rolling walker (2 wheels) Transfers: Sit to/from Stand Sit to Stand: Min assist;From elevated surface           General transfer comment: declined    Ambulation/Gait Ambulation/Gait assistance: Min Paediatric nurse (Feet): 40 Feet Assistive device: Rolling walker (2 wheels) Gait Pattern/deviations: Step-through pattern;Step-to pattern;Decreased stride length;Decreased stance time - right Gait velocity: reduced Gait velocity interpretation: <1.31 ft/sec, indicative of household ambulator Pre-gait activities: standing balance control and wgt shift General Gait Details: step to and finally step through as he relaxed R hip during gait, somewhat relieving   Stairs             Wheelchair Mobility    Modified Rankin (Stroke Patients Only)       Balance Overall balance assessment: Needs assistance Sitting-balance support: Feet supported Sitting balance-Leahy Scale: Fair     Standing balance support: Bilateral upper extremity supported;During functional activity Standing balance-Leahy Scale: Poor Standing balance comment: balance fair- by end of session with cues for offsetting balance on walker with PWB                            Cognition Arousal/Alertness: Awake/alert Behavior During Therapy: WFL for tasks assessed/performed Overall Cognitive Status: Within Functional Limits for tasks assessed                                          Exercises Total Joint Exercises Ankle Circles/Pumps: AROM;5 reps Quad Sets: AROM;10 reps Gluteal Sets: AROM;10 reps Heel Slides: AAROM;AROM;10  reps Hip ABduction/ADduction: AROM;AAROM;10 reps Straight Leg Raises: AAROM;10 reps    General Comments General comments (skin integrity, edema, etc.): instructions for use of walker with WB limitation and then to stand with commode seat, weak and painful R hip       Pertinent Vitals/Pain Pain Assessment: 0-10 Pain Score: 7  Breathing: occasional labored breathing, short period of hyperventilation Negative Vocalization: none Facial Expression: sad, frightened, frown Body Language: tense, distressed pacing, fidgeting Consolability: no need to console PAINAD Score: 3 Pain Location: R hip Pain Descriptors / Indicators: Grimacing;Guarding;Spasm Pain Intervention(s): Limited activity within patient's tolerance;Monitored during session;Premedicated before session;Repositioned;Ice applied    Home Living                          Prior Function            PT Goals (current goals can now be found in the care plan section) Acute Rehab PT Goals Patient Stated Goal: get pain down Progress towards PT goals: Progressing toward goals    Frequency    7X/week      PT Plan Current plan remains appropriate    Co-evaluation              AM-PAC PT "6 Clicks" Mobility   Outcome Measure  Help needed turning from your back to your side while in a flat bed without using bedrails?: A Lot Help needed moving from lying on your back to sitting on the side of a flat bed without using bedrails?: A Lot Help needed moving to and from a bed to a chair (including a wheelchair)?: A Lot Help needed standing up from a chair using your arms (e.g., wheelchair or bedside chair)?: A Lot Help needed to walk in hospital room?: A Lot Help needed climbing 3-5 steps with a railing? : Total 6 Click Score: 11    End of Session Equipment Utilized During Treatment: Oxygen Activity Tolerance: Patient limited by pain;Treatment limited secondary to medical complications (Comment) Patient left: in bed;with call bell/phone within reach;with bed alarm set Nurse Communication: Mobility status PT Visit Diagnosis: Other abnormalities of gait and mobility (R26.89);Muscle weakness (generalized) (M62.81);Pain Pain - Right/Left: Right Pain - part of body: Hip      Time: 8786-7672 PT Time Calculation (min) (ACUTE ONLY): 19 min  Charges:  $Gait Training: 8-22 mins $Therapeutic Exercise: 8-22 mins $Therapeutic Activity: 8-22 mins           Ivar Drape 03/15/2021, 8:34 PM  Samul Dada, PT PhD Acute Rehab Dept. Number: [facility] [id number] and [facility] [phone]

## 2021-03-15 NOTE — Plan of Care (Signed)
Pt continues to complain of pain in R leg (6/10), which has improved since yesterday (8/10). Pt stated that it is feels a little sore while at rest but increases with activity. No c/o tingling, decreased sensation, or tightness in the leg. Pt's leg has been elevated and ice has been applied until his next pain meds are due. Patient is A&O X 4 and has no other complaint at this time.   Problem: Education: Goal: Knowledge of General Education information will improve Description: Including pain rating scale, medication(s)/side effects and non-pharmacologic comfort measures Outcome: Progressing   Problem: Health Behavior/Discharge Planning: Goal: Ability to manage health-related needs will improve Outcome: Progressing   Problem: Clinical Measurements: Goal: Ability to maintain clinical measurements within normal limits will improve Outcome: Progressing Goal: Will remain free from infection Outcome: Progressing Goal: Diagnostic test results will improve Outcome: Progressing Goal: Respiratory complications will improve Outcome: Progressing Goal: Cardiovascular complication will be avoided Outcome: Progressing   Problem: Activity: Goal: Risk for activity intolerance will decrease Outcome: Progressing   Problem: Nutrition: Goal: Adequate nutrition will be maintained Outcome: Progressing   Problem: Coping: Goal: Level of anxiety will decrease Outcome: Progressing   Problem: Elimination: Goal: Will not experience complications related to bowel motility Outcome: Progressing Goal: Will not experience complications related to urinary retention Outcome: Progressing   Problem: Pain Managment: Goal: General experience of comfort will improve Outcome: Progressing   Problem: Safety: Goal: Ability to remain free from injury will improve Outcome: Progressing   Problem: Skin Integrity: Goal: Risk for impaired skin integrity will decrease Outcome: Progressing   Problem:  Education: Goal: Knowledge of the prescribed therapeutic regimen will improve Outcome: Progressing Goal: Understanding of discharge needs will improve Outcome: Progressing Goal: Individualized Educational Video(s) Outcome: Progressing   Problem: Activity: Goal: Ability to avoid complications of mobility impairment will improve Outcome: Progressing Goal: Ability to tolerate increased activity will improve Outcome: Progressing   Problem: Clinical Measurements: Goal: Postoperative complications will be avoided or minimized Outcome: Progressing   Problem: Pain Management: Goal: Pain level will decrease with appropriate interventions Outcome: Progressing   Problem: Skin Integrity: Goal: Will show signs of wound healing Outcome: Progressing

## 2021-03-16 LAB — GLUCOSE, CAPILLARY
Glucose-Capillary: 176 mg/dL — ABNORMAL HIGH (ref 70–99)
Glucose-Capillary: 164 mg/dL — ABNORMAL HIGH (ref 70–99)
Glucose-Capillary: 199 mg/dL — ABNORMAL HIGH (ref 70–99)
Glucose-Capillary: 216 mg/dL — ABNORMAL HIGH (ref 70–99)
Glucose-Capillary: 231 mg/dL — ABNORMAL HIGH (ref 70–99)

## 2021-03-16 MED ORDER — TIZANIDINE HCL 4 MG PO TABS: 4.0000 mg | ORAL_TABLET | Freq: Three times a day (TID) | ORAL | 1 refills | Status: DC | PRN

## 2021-03-16 MED ORDER — HYDROMORPHONE HCL 4 MG PO TABS
4.0000 mg | ORAL_TABLET | Freq: Four times a day (QID) | ORAL | 0 refills | Status: DC | PRN
Start: 2021-03-16 — End: 2021-03-20

## 2021-03-16 MED ORDER — ASPIRIN 81 MG PO CHEW: 81.0000 mg | CHEWABLE_TABLET | Freq: Two times a day (BID) | ORAL | 0 refills | Status: AC

## 2021-03-16 MED ORDER — DOXYCYCLINE HYCLATE 100 MG PO CAPS: 100.0000 mg | ORAL_CAPSULE | Freq: Two times a day (BID) | ORAL | 0 refills | Status: DC

## 2021-03-16 NOTE — Plan of Care (Signed)
Problem: Activity: Goal: Risk for activity intolerance will decrease Outcome: Progressing   Problem: Coping: Goal: Level of anxiety will decrease Outcome: Progressing   Problem: Elimination: Goal: Will not experience complications related to bowel motility Outcome: Not Met (patient endorsing constipation)

## 2021-03-16 NOTE — Progress Notes (Signed)
Mobility Specialist Progress Note   03/16/21 1445  Mobility  Activity Ambulated in room;Ambulated in hall  Level of Assistance Standby assist, set-up cues, supervision of patient - no hands on  Assistive Device Front wheel walker  RLE Weight Bearing PWB  RLE Partial Weight Bearing Percentage or Pounds 20-25%  Distance Ambulated (ft) 60 ft  Mobility Ambulated with assistance in room;Ambulated with assistance in hallway  Mobility Response Tolerated well  Mobility performed by Mobility specialist  $Mobility charge 1 Mobility   Received pt in bed having no complaints and agreeable to mobility. Asymptomatic throughout ambulation, returned back to bed w/ call bell in reach and all needs met.  Holland Falling Mobility Specialist Phone Number [phone]

## 2021-03-16 NOTE — Progress Notes (Signed)
Subjective: 3 Days Post-Op Procedure(s) (LRB): Right TOTAL HIP ARTHROPLASTY ANTERIOR APPROACH (Right) Right Hip HARDWARE REMOVAL (Right) Patient reports pain as moderate.    Objective: Vital signs in last 24 hours: Temp:  [98.5 F (36.9 C)-100.4 F (38 C)] 100.4 F (38 C) (01/13 1412) Pulse Rate:  [92] 92 (01/13 1412) Resp:  [16-18] 18 (01/13 1412) BP: (114-115)/(69-74) 114/74 (01/13 1412) SpO2:  [93 %] 93 % (01/13 1412)  Intake/Output from previous day: 01/12 0701 - 01/13 0700 In: 150 [P.O.:150] Out: 700 [Urine:700] Intake/Output this shift: Total I/O In: 480 [P.O.:480] Out: 100 [Urine:100]  Recent Labs    03/14/21 0249  HGB 11.7*   Recent Labs    03/14/21 0249  WBC 11.5*  RBC 3.69*  HCT 34.8*  PLT 118*   Recent Labs    03/14/21 0249  NA 137  K 4.9  CL 107  CO2 21*  BUN 18  CREATININE 0.89  GLUCOSE 176*  CALCIUM 7.7*   No results for input(s): LABPT, INR in the last 72 hours.  Intact pulses distally Dorsiflexion/Plantar flexion intact Incision: scant drainage   Assessment/Plan: 3 Days Post-Op Procedure(s) (LRB): Right TOTAL HIP ARTHROPLASTY ANTERIOR APPROACH (Right) Right Hip HARDWARE REMOVAL (Right) Up with therapy Plan for discharge tomorrow Discharge home with home health      Kathryne Hitch 03/16/2021, 3:44 PM

## 2021-03-16 NOTE — Progress Notes (Signed)
Physical Therapy Treatment Patient Details Name: [name] MRN: [id number] DOB: 11-09-1975 Today's Date: 03/16/2021   History of Present Illness 46 y.o. male presents to [facility] hospital on 03/13/2021 for R THA. PMH includes anxiety, OA, chronic low back pain, HTN, scoliosis, CVA, DMII.    PT Comments    Pt admitted with above diagnosis. Pt was able to ambulate with RW with min guard assist and cues for weight bearing right LE. Pt also able to ascend and descend steps and all handouts given to pt. All education complete and pt states that family can assist at d/c and plan is for home today.  Pt currently with functional limitations due to balance and endurance deficits. Pt will benefit from skilled PT to increase their independence and safety with mobility to allow discharge to the venue listed below.      Recommendations for follow up therapy are one component of a multi-disciplinary discharge planning process, led by the attending physician.  Recommendations may be updated based on patient status, additional functional criteria and insurance authorization.  Follow Up Recommendations  Follow physician's recommendations for discharge plan and follow up therapies     Assistance Recommended at Discharge Intermittent Supervision/Assistance  Patient can return home with the following A little help with walking and/or transfers;A little help with bathing/dressing/bathroom;Assistance with cooking/housework;Assist for transportation;Help with stairs or ramp for entrance   Equipment Recommendations  Rolling walker (2 wheels);BSC/3in1    Recommendations for Other Services Rehab consult     Precautions / Restrictions Precautions Precautions: Fall Precaution Comments: direct anterior THA Restrictions RLE Weight Bearing: Partial weight bearing RLE Partial Weight Bearing Percentage or Pounds: 20-25%     Mobility  Bed Mobility Overal bed mobility: Needs Assistance Bed Mobility: Rolling Rolling:  Min guard Sidelying to sit: Min assist   Sit to supine: Min assist   General bed mobility comments: requires bed rails to shift on the bed. Pt uses left LE under right to get LEs off and on bed but still needed PT to hold the weight for the transition.    Transfers Overall transfer level: Needs assistance Equipment used: Rolling walker (2 wheels) Transfers: Sit to/from Stand Sit to Stand: Min guard           General transfer comment: Pt was able to stand from bed and chair with min guard assist. Incr time to get feet in correct position and occasional cues for hand placement.    Ambulation/Gait Ambulation/Gait assistance: Min guard Gait Distance (Feet): 50 Feet Assistive device: Rolling walker (2 wheels) Gait Pattern/deviations: Step-through pattern;Step-to pattern;Decreased stride length;Decreased stance time - right;Decreased step length - right;Decreased weight shift to right;Wide base of support Gait velocity: reduced Gait velocity interpretation: <1.31 ft/sec, indicative of household ambulator   General Gait Details: step to initially and finally step through as he relaxed R hip during gait needing cues for sequencing. Pt needed cues to only put toes down and not forefoot to maintain 20-25% weight on right LE. constant cues given to pt.   Stairs Stairs: Yes Stairs assistance: Min guard;+2 safety/equipment Stair Management: Backwards;With walker;Step to pattern Number of Stairs: 3 General stair comments: Pt able to ascend and descend steps wtih cues and min guard assist without difficulty - handout given   Wheelchair Mobility    Modified Rankin (Stroke Patients Only)       Balance Overall balance assessment: Needs assistance Sitting-balance support: Feet supported Sitting balance-Leahy Scale: Fair Sitting balance - Comments: pt leaning to left side, likely to  offload R hip Postural control: Left lateral lean Standing balance support: Bilateral upper extremity  supported;During functional activity Standing balance-Leahy Scale: Poor Standing balance comment: balance fair- by end of session with cues for offsetting balance on walker with PWB                            Cognition Arousal/Alertness: Awake/alert Behavior During Therapy: WFL for tasks assessed/performed Overall Cognitive Status: Within Functional Limits for tasks assessed                                          Exercises Total Joint Exercises Ankle Circles/Pumps: AROM;5 reps Quad Sets: AROM;10 reps Heel Slides: AAROM;AROM;10 reps Hip ABduction/ADduction: AROM;AAROM;10 reps Straight Leg Raises: AAROM;10 reps Marching in Standing: Standing (reviewed) Standing Hip Extension: AAROM;Standing (reviewed)    General Comments General comments (skin integrity, edema, etc.): 90% RA      Pertinent Vitals/Pain Pain Assessment: Faces Faces Pain Scale: Hurts even more Pain Location: R hip Pain Descriptors / Indicators: Grimacing;Guarding;Spasm Pain Intervention(s): Limited activity within patient's tolerance;Monitored during session;Repositioned;Patient requesting pain meds-RN notified    Home Living                          Prior Function            PT Goals (current goals can now be found in the care plan section) Progress towards PT goals: Progressing toward goals    Frequency    7X/week      PT Plan Current plan remains appropriate    Co-evaluation              AM-PAC PT "6 Clicks" Mobility   Outcome Measure  Help needed turning from your back to your side while in a flat bed without using bedrails?: A Little Help needed moving from lying on your back to sitting on the side of a flat bed without using bedrails?: A Little Help needed moving to and from a bed to a chair (including a wheelchair)?: A Little Help needed standing up from a chair using your arms (e.g., wheelchair or bedside chair)?: A Little Help needed to  walk in hospital room?: A Little Help needed climbing 3-5 steps with a railing? : A Little 6 Click Score: 18    End of Session Equipment Utilized During Treatment: Gait belt Activity Tolerance: Patient limited by fatigue Patient left: in bed;with call bell/phone within reach;with bed alarm set Nurse Communication: Mobility status PT Visit Diagnosis: Other abnormalities of gait and mobility (R26.89);Muscle weakness (generalized) (M62.81);Pain Pain - Right/Left: Right Pain - part of body: Hip     Time: 5916-3846 PT Time Calculation (min) (ACUTE ONLY): 37 min  Charges:  $Gait Training: 8-22 mins $Therapeutic Exercise: 8-22 mins                     [name],PT Acute Rehab Services [phone] (pager)    [name] 03/16/2021, 10:29 AM

## 2021-03-16 NOTE — Progress Notes (Signed)
PT Cancellation Note  Patient Details Name: [name] MRN: [id number] DOB: 09-13-1975   Cancelled Treatment:    Reason Eval/Treat Not Completed: Other (comment) (Pt ambulated with mobility this pm.) All education reviewed  in first session for pt.   [name] 03/16/2021, 3:27 PM [name],PT Acute Rehab Services [phone] (pager)

## 2021-03-17 ENCOUNTER — Other Ambulatory Visit: Payer: Self-pay | Admitting: Physician Assistant

## 2021-03-17 DIAGNOSIS — Z96641 Presence of right artificial hip joint: Secondary | ICD-10-CM

## 2021-03-17 LAB — GLUCOSE, CAPILLARY: Glucose-Capillary: 239 mg/dL — ABNORMAL HIGH (ref 70–99)

## 2021-03-17 NOTE — Progress Notes (Signed)
Patient stable, ready to do PT Ready to go home today from mobility and pain standpoint. D/c orders placed. F/u Dr. Ninfa Linden 2 weeks.

## 2021-03-17 NOTE — Progress Notes (Signed)
Occupational Therapy Treatment Patient Details Name: [name] MRN: [id number] DOB: [birth year] Today's Date: 03/17/2021   History of present illness 46 y.o. male presents to [facility] hospital on 03/13/2021 for R THA. PMH includes anxiety, OA, chronic low back pain, HTN, scoliosis, CVA, DMII.   OT comments  Patient continues to make steady progress towards goals in skilled OT session. Patient's session encompassed dressing task and education with regard to AE, bathing, and other higher level ADLs in preparation for discharging home. Patient able to complete dressing within appropriate precautions, went over sock aid, long handled sponge, and strategies for bathing once at home. Patient is going to order sock aid and long handled sponge, and states he already has a reacher at home. Instructed patient that 3n1 can be used for a seat in the shower to appropriately adhere to weight bearing precautions to complete bathing, with patient acknowledging. Patient's son in the room at the end of session, and patient having family that can assist with higher level needs at discharge. Discharge remains appropriate, therapy will follow in house.    Recommendations for follow up therapy are one component of a multi-disciplinary discharge planning process, led by the attending physician.  Recommendations may be updated based on patient status, additional functional criteria and insurance authorization.    Follow Up Recommendations  No OT follow up    Assistance Recommended at Discharge Intermittent Supervision/Assistance  Patient can return home with the following  A little help with bathing/dressing/bathroom;Assistance with cooking/housework;Help with stairs or ramp for entrance;Assist for transportation   Equipment Recommendations  BSC/3in1    Recommendations for Other Services      Precautions / Restrictions Precautions Precautions: Fall Precaution Comments: direct anterior THA Restrictions Weight Bearing  Restrictions: Yes RLE Weight Bearing: Partial weight bearing RLE Partial Weight Bearing Percentage or Pounds: 20-25% Other Position/Activity Restrictions: 25% WBS RLE       Mobility Bed Mobility Overal bed mobility: Needs Assistance Bed Mobility: Supine to Sit     Supine to sit: HOB elevated;Supervision Sit to supine: Supervision   General bed mobility comments: Pt able to hook R LE with L to assist in LE management onto and off bed. Use of bed rails with HOB slightly elevated.    Transfers Overall transfer level: Needs assistance Equipment used: Rolling walker (2 wheels) Transfers: Sit to/from Stand Sit to Stand: Min guard           General transfer comment: Pt was able to stand from bed with min guard assist. Incr time to get feet in correct position     Balance Overall balance assessment: Needs assistance Sitting-balance support: Feet supported Sitting balance-Leahy Scale: Fair Sitting balance - Comments: pt leaning to left side, likely to offload R hip Postural control: Left lateral lean Standing balance support: Bilateral upper extremity supported;During functional activity Standing balance-Leahy Scale: Poor Standing balance comment: balance fair- by end of session with cues for offsetting balance on walker with PWB                           ADL either performed or assessed with clinical judgement   ADL                       Lower Body Dressing: Set up;Sitting/lateral leans Lower Body Dressing Details (indicate cue type and reason): able to thread R leg first, then left, and complete lateral leans to bring pants up  Toileting- Clothing Manipulation and Hygiene: Supervision/safety;Sitting/lateral lean         General ADL Comments: Patient able to complete dressing within appropriate precautions, went over sock aid, long handled sponge, and strategies for bathing once at home    Extremity/Trunk Assessment              Vision        Perception     Praxis      Cognition Arousal/Alertness: Awake/alert Behavior During Therapy: WFL for tasks assessed/performed Overall Cognitive Status: Within Functional Limits for tasks assessed                                 General Comments: motivated to get out of hospital          Exercises     Shoulder Instructions       General Comments Assist required for peri care after BM    Pertinent Vitals/ Pain       Pain Assessment: Faces Pain Score: 8  Faces Pain Scale: Hurts little more Pain Location: R hip Pain Descriptors / Indicators: Grimacing;Guarding;Spasm Pain Intervention(s): Limited activity within patient's tolerance;Monitored during session;Repositioned  Home Living                                          Prior Functioning/Environment              Frequency  Min 2X/week        Progress Toward Goals  OT Goals(current goals can now be found in the care plan section)  Progress towards OT goals: Progressing toward goals  Acute Rehab OT Goals Patient Stated Goal: to go home OT Goal Formulation: With patient Time For Goal Achievement: 03/29/21 Potential to Achieve Goals: Good  Plan Discharge plan remains appropriate    Co-evaluation                 AM-PAC OT "6 Clicks" Daily Activity     Outcome Measure   Help from another person eating meals?: None Help from another person taking care of personal grooming?: None Help from another person toileting, which includes using toliet, bedpan, or urinal?: A Little Help from another person bathing (including washing, rinsing, drying)?: A Little Help from another person to put on and taking off regular upper body clothing?: None Help from another person to put on and taking off regular lower body clothing?: A Little 6 Click Score: 21    End of Session Equipment Utilized During Treatment: Rolling walker (2 wheels)  OT Visit Diagnosis: Unsteadiness on  feet (R26.81);Pain Pain - Right/Left: Right Pain - part of body: Leg   Activity Tolerance Patient tolerated treatment well   Patient Left in chair;with call bell/phone within reach;with family/visitor present   Nurse Communication Mobility status;Other (comment) (Ready for discharge paperwork)        Time: [id number] OT Time Calculation (min): 17 min  Charges: OT General Charges $OT Visit: 1 Visit OT Treatments $Self Care/Home Management : 8-22 mins  Corinne Ports E. Vickey Boak, COTA/L Acute Rehabilitation Services [phone] [place] 03/17/2021, 11:27 AM

## 2021-03-17 NOTE — Progress Notes (Signed)
Physical Therapy Treatment Patient Details Name: [name] MRN: [id number] DOB: 09-24-1975 Today's Date: 03/17/2021   History of Present Illness 46 y.o. male presents to [facility] hospital on 03/13/2021 for R THA. PMH includes anxiety, OA, chronic low back pain, HTN, scoliosis, CVA, DMII.    PT Comments    Pt with slow labored gait but able to increase ambulation distance this session. Pt has completed stair training and HEP training in previous session. He is expected to d/c today.    Recommendations for follow up therapy are one component of a multi-disciplinary discharge planning process, led by the attending physician.  Recommendations may be updated based on patient status, additional functional criteria and insurance authorization.  Follow Up Recommendations  Follow physician's recommendations for discharge plan and follow up therapies     Assistance Recommended at Discharge Intermittent Supervision/Assistance  Patient can return home with the following A little help with walking and/or transfers;A little help with bathing/dressing/bathroom;Assistance with cooking/housework;Assist for transportation;Help with stairs or ramp for entrance   Equipment Recommendations  Rolling walker (2 wheels);BSC/3in1    Recommendations for Other Services       Precautions / Restrictions Precautions Precautions: Fall Precaution Comments: direct anterior THA Restrictions Weight Bearing Restrictions: Yes RLE Weight Bearing: Partial weight bearing RLE Partial Weight Bearing Percentage or Pounds: 20-25% Other Position/Activity Restrictions: 25% WBS RLE     Mobility  Bed Mobility Overal bed mobility: Needs Assistance Bed Mobility: Supine to Sit;Sit to Supine     Supine to sit: HOB elevated;Supervision Sit to supine: Supervision   General bed mobility comments: Pt able to hook R LE with L to assist in LE management onto and off bed. Use of bed rails with HOB slightly elevated.     Transfers Overall transfer level: Needs assistance Equipment used: Rolling walker (2 wheels) Transfers: Sit to/from Stand Sit to Stand: Min guard           General transfer comment: Pt was able to stand from bed and toilet with min guard assist. Incr time to get feet in correct position and occasional cues for hand placement.    Ambulation/Gait Ambulation/Gait assistance: Min guard Gait Distance (Feet): 115 Feet Assistive device: Rolling walker (2 wheels) Gait Pattern/deviations: Step-to pattern;Decreased stride length;Decreased stance time - right;Decreased step length - right;Decreased weight shift to right;Wide base of support Gait velocity: reduced     General Gait Details: Slow labored gait. Pt maintaining WB precautions well with weight being placed mostly through the toes. Pt required standing rest breaks and noted quality of gait decreased as pt fatigued.   Stairs             Wheelchair Mobility    Modified Rankin (Stroke Patients Only)       Balance Overall balance assessment: Needs assistance Sitting-balance support: Feet supported Sitting balance-Leahy Scale: Fair Sitting balance - Comments: pt leaning to left side, likely to offload R hip Postural control: Left lateral lean Standing balance support: Bilateral upper extremity supported;During functional activity Standing balance-Leahy Scale: Poor Standing balance comment: balance fair- by end of session with cues for offsetting balance on walker with PWB                            Cognition Arousal/Alertness: Awake/alert Behavior During Therapy: WFL for tasks assessed/performed Overall Cognitive Status: Within Functional Limits for tasks assessed  General Comments: Somewhat flat, likely due to pain        Exercises      General Comments General comments (skin integrity, edema, etc.): Assist required for peri care after BM       Pertinent Vitals/Pain Pain Assessment: 0-10 Pain Score: 8  Pain Location: R hip Pain Descriptors / Indicators: Grimacing;Guarding;Spasm Pain Intervention(s): Monitored during session;Limited activity within patient's tolerance    Home Living                          Prior Function            PT Goals (current goals can now be found in the care plan section) Acute Rehab PT Goals Patient Stated Goal: get pain down PT Goal Formulation: With patient Time For Goal Achievement: 03/19/21 Potential to Achieve Goals: Good Progress towards PT goals: Progressing toward goals    Frequency    7X/week      PT Plan Current plan remains appropriate    Co-evaluation              AM-PAC PT "6 Clicks" Mobility   Outcome Measure  Help needed turning from your back to your side while in a flat bed without using bedrails?: A Little Help needed moving from lying on your back to sitting on the side of a flat bed without using bedrails?: A Little Help needed moving to and from a bed to a chair (including a wheelchair)?: A Little Help needed standing up from a chair using your arms (e.g., wheelchair or bedside chair)?: A Little Help needed to walk in hospital room?: A Little Help needed climbing 3-5 steps with a railing? : A Little 6 Click Score: 18    End of Session Equipment Utilized During Treatment: Gait belt Activity Tolerance: Patient tolerated treatment well Patient left: in bed;with call bell/phone within reach Nurse Communication: Mobility status PT Visit Diagnosis: Other abnormalities of gait and mobility (R26.89);Muscle weakness (generalized) (M62.81);Pain Pain - Right/Left: Right Pain - part of body: Hip     Time: 0930-1006 PT Time Calculation (min) (ACUTE ONLY): 36 min  Charges:  $Gait Training: 23-37 mins           Benjiman Core, [place] Pager [phone] Acute Rehab  Allena Katz 03/17/2021, 11:07 AM

## 2021-03-17 NOTE — Discharge Summary (Signed)
Patient ID: [name] MRN: [id number] DOB/AGE: Oct 14, 1975 46 y.o.  Admit date: 03/13/2021 Discharge date: 03/17/2021  Admission Diagnoses:  Unilateral primary osteoarthritis, right hip  Discharge Diagnoses:  Principal Problem:   Unilateral primary osteoarthritis, right hip Active Problems:   Status post total replacement of right hip   Past Medical History:  Diagnosis Date   Acute pancreatitis 07/2010   Anxiety    Arthritis    "lower back and shoulders" (10/07/2017)   Chronic lower back pain    Dyslipidemia    Dyspnea    Headache    "weekly" (10/07/2017)   Hypercholesteremia    Hypertension    MRSA (methicillin resistant staph aureus) culture positive    Obesity    Obesity    OSA on CPAP    Peripheral neuropathy    Scoliosis    Sleep apnea    "can't tolerate any of the masks" (10/07/2017)   Sleep apnea    Stroke (HCC)    stroke-like 2.5 years ago   TIA (transient ischemic attack)    Type II diabetes mellitus (HCC)    Umbilical hernia     Surgeries: Procedure(s): Right TOTAL HIP ARTHROPLASTY ANTERIOR APPROACH Right Hip HARDWARE REMOVAL on 03/13/2021   Consultants (if any):   Discharged Condition: Improved  Hospital Course: [name] is an 46 y.o. male who was admitted 03/13/2021 with a diagnosis of Unilateral primary osteoarthritis, right hip and went to the operating room on 03/13/2021 and underwent the above named procedures.    He was given perioperative antibiotics:  Anti-infectives (From admission, onward)    Start     Dose/Rate Route Frequency Ordered Stop   03/16/21 0000  doxycycline (VIBRAMYCIN) 100 MG capsule        100 mg Oral 2 times daily 03/16/21 1547     03/14/21 0030  ceFAZolin (ANCEF) IVPB 2g/100 mL premix        2 g 200 mL/hr over 30 Minutes Intravenous Every 6 hours 03/13/21 2132 03/14/21 0604   03/13/21 1145  ceFAZolin (ANCEF) IVPB 3g/100 mL premix        3 g 200 mL/hr over 30 Minutes Intravenous On call to O.R. 03/13/21 1143  03/13/21 1838     .  He was given sequential compression devices, early ambulation, and appropriate chemoprophylaxis for DVT prophylaxis.  He benefited maximally from the hospital stay and there were no complications.    Recent vital signs:  Vitals:   03/16/21 1948 03/17/21 0925  BP: 126/65 127/80  Pulse: 94 84  Resp:  20  Temp: 99.6 F (37.6 C) 99.3 F (37.4 C)  SpO2: 92% 96%    Recent laboratory studies:  Lab Results  Component Value Date   HGB 11.7 (L) 03/14/2021   HGB 13.3 03/09/2021   HGB 15.6 05/24/2019   Lab Results  Component Value Date   WBC 11.5 (H) 03/14/2021   PLT 118 (L) 03/14/2021   No results found for: INR Lab Results  Component Value Date   NA 137 03/14/2021   K 4.9 03/14/2021   CL 107 03/14/2021   CO2 21 (L) 03/14/2021   BUN 18 03/14/2021   CREATININE 0.89 03/14/2021   GLUCOSE 176 (H) 03/14/2021    Discharge Medications:   Allergies as of 03/17/2021       Reactions   Losartan Swelling, Rash   Patient says 'he's not 100% sure if it was losartan, but he thinks it is." He knows it was a blood pressure medicine.  Medication List     STOP taking these medications    acetaminophen-codeine 300-30 MG tablet Commonly known as: TYLENOL #3   aspirin 81 MG EC tablet Replaced by: aspirin 81 MG chewable tablet       TAKE these medications    Airborne Chew Chew 3 tablets by mouth at bedtime.   amLODipine 10 MG tablet Commonly known as: NORVASC Take 1 tablet (10 mg total) by mouth daily.   aspirin 81 MG chewable tablet Chew 1 tablet (81 mg total) by mouth 2 (two) times daily. Replaces: aspirin 81 MG EC tablet   BD Pen Needle Nano U/F 32G X 4 MM Misc Generic drug: Insulin Pen Needle Use with insulin pens 4 times daily   carvedilol 6.25 MG tablet Commonly known as: COREG Take 6.25 mg by mouth 2 (two) times daily with a meal.   doxycycline 100 MG capsule Commonly known as: VIBRAMYCIN Take 1 capsule (100 mg total) by mouth  2 (two) times daily. What changed:  when to take this reasons to take this   escitalopram 20 MG tablet Commonly known as: LEXAPRO Take 20 mg by mouth daily.   FreeStyle Libre 14 Day Reader Hardie Pulley use to monitor blood gluocse continuously   FreeStyle Libre 14 Day Sensor Misc Apply topically every 14 (fourteen) days.   gabapentin 300 MG capsule Commonly known as: NEURONTIN Take 300-600 mg by mouth 3 (three) times daily.   HumaLOG KwikPen 100 UNIT/ML KwikPen Generic drug: insulin lispro Inject 15 Units into the skin 3 (three) times daily before meals.   HYDROmorphone 4 MG tablet Commonly known as: Dilaudid Take 1 tablet (4 mg total) by mouth every 6 (six) hours as needed for severe pain.   insulin glargine (1 Unit Dial) 300 UNIT/ML Solostar Pen Commonly known as: TOUJEO Inject 60 Units into the skin 2 (two) times daily.   metFORMIN 500 MG 24 hr tablet Commonly known as: GLUCOPHAGE-XR Take 2 tablets (1,000 mg total) by mouth 2 (two) times daily with a meal.   propranolol 10 MG tablet Commonly known as: INDERAL Take 10 mg by mouth 3 (three) times daily.   tiZANidine 4 MG tablet Commonly known as: Zanaflex Take 1 tablet (4 mg total) by mouth every 8 (eight) hours as needed for muscle spasms.   Trulicity 1.5 MG/0.5ML Sopn Generic drug: Dulaglutide Inject 1.5 mg into the skin every Saturday.               Durable Medical Equipment  (From admission, onward)           Start     Ordered   03/13/21 2133  DME 3 n 1  Once        03/13/21 2132   03/13/21 2133  DME Walker rolling  Once       Question Answer Comment  Walker: With 5 Inch Wheels   Patient needs a walker to treat with the following condition Status post total replacement of right hip      03/13/21 2132            Diagnostic Studies: DG Pelvis Portable  Result Date: 03/13/2021 CLINICAL DATA:  Postoperative right hip. EXAM: PORTABLE PELVIS 1-2 VIEWS COMPARISON:  Right hip x-ray 03/13/2021.  FINDINGS: There is a right hip total arthroplasty in anatomic alignment. Single cerclage wire seen in the proximal femur. There is lateral soft tissue swelling and air compatible with recent surgery. There are small radiopaque fiber seen near the cerclage wire. Left-sided hip screws appear  uncomplicated.  No fractures are seen. IMPRESSION: 1. Right hip arthroplasty in anatomic alignment. 2. Small fibers near the cerclage wire in the proximal femur. 3. Lateral right soft tissue swelling and air compatible with recent surgery. Electronically Signed   By: Darliss CheneyAmy  Guttmann [name]   On: 03/13/2021 19:59   DG C-Arm 1-60 Min-No Report  Result Date: 03/13/2021 Fluoroscopy was utilized by the requesting physician.  No radiographic interpretation.   DG C-Arm 1-60 Min-No Report  Result Date: 03/13/2021 Fluoroscopy was utilized by the requesting physician.  No radiographic interpretation.   DG C-Arm 1-60 Min-No Report  Result Date: 03/13/2021 Fluoroscopy was utilized by the requesting physician.  No radiographic interpretation.   DG C-Arm 1-60 Min-No Report  Result Date: 03/13/2021 Fluoroscopy was utilized by the requesting physician.  No radiographic interpretation.   DG HIP UNILAT WITH PELVIS 1V RIGHT  Result Date: 03/13/2021 CLINICAL DATA:  Right total hip arthroplasty. EXAM: DG HIP (WITH OR WITHOUT PELVIS) 1V RIGHT COMPARISON:  Right hip x-ray 01/17/2020. FINDINGS: Intraoperative right hip. Three low resolution intraoperative spot views of the right hip were obtained. Right hip arthroplasty is present in anatomic alignment. No fracture visible on the limited views. Total fluoroscopy time: 1 minute 25 seconds Total radiation dose: 17.25 micro gray IMPRESSION: Intraoperative right hip arthroplasty. Electronically Signed   By: Darliss CheneyAmy  Guttmann [name]   On: 03/13/2021 18:55    Disposition: Discharge disposition: 01-Home or Self Care       Discharge Instructions     Call MD / Call 911   Complete by: As  directed    If you experience chest pain or shortness of breath, CALL 911 and be transported to the hospital emergency room.  If you develope a fever above 101.5 F, pus (white drainage) or increased drainage or redness at the wound, or calf pain, call your surgeon's office.   Constipation Prevention   Complete by: As directed    Drink plenty of fluids.  Prune juice may be helpful.  You may use a stool softener, such as Colace (over the counter) 100 mg twice a day.  Use MiraLax (over the counter) for constipation as needed.   Driving restrictions   Complete by: As directed    No driving while taking narcotic pain meds.   Increase activity slowly as tolerated   Complete by: As directed    Post-operative opioid taper instructions:   Complete by: As directed    POST-OPERATIVE OPIOID TAPER INSTRUCTIONS: It is important to wean off of your opioid medication as soon as possible. If you do not need pain medication after your surgery it is ok to stop day one. Opioids include: Codeine, Hydrocodone(Norco, Vicodin), Oxycodone(Percocet, oxycontin) and hydromorphone amongst others.  Long term and even short term use of opiods can cause: Increased pain response Dependence Constipation Depression Respiratory depression And more.  Withdrawal symptoms can include Flu like symptoms Nausea, vomiting And more Techniques to manage these symptoms Hydrate well Eat regular healthy meals Stay active Use relaxation techniques(deep breathing, meditating, yoga) Do Not substitute Alcohol to help with tapering If you have been on opioids for less than two weeks and do not have pain than it is ok to stop all together.  Plan to wean off of opioids This plan should start within one week post op of your joint replacement. Maintain the same interval or time between taking each dose and first decrease the dose.  Cut the total daily intake of opioids by one tablet each day Next  start to increase the time between  doses. The last dose that should be eliminated is the evening dose.           Follow-up Information     Kathryne HitchBlackman, Christopher Y, MD Follow up in 2 week(s).   Specialty: Orthopedic Surgery Contact information: [address] [place] [address] [phone]         Care, [facility] Follow up.   Specialty: Home Health Services Why: home health services [facility] by [facility], start of care within 48 hours post discharge Contact information: [address] [place] [address] [phone]                  Signed: Glee ArvinMichael Zymere Patlan 03/17/2021, 9:35 AM

## 2021-03-19 ENCOUNTER — Ambulatory Visit (INDEPENDENT_AMBULATORY_CARE_PROVIDER_SITE_OTHER): Payer: BC Managed Care – PPO | Admitting: Physician Assistant

## 2021-03-19 ENCOUNTER — Encounter: Payer: Self-pay | Admitting: Physician Assistant

## 2021-03-19 DIAGNOSIS — Z96641 Presence of right artificial hip joint: Secondary | ICD-10-CM

## 2021-03-19 NOTE — Progress Notes (Signed)
HPI: [name] comes in today status post right total hip arthroplasty.  This is a hardware removal and then right total hip arthroplasty performed on 03/13/2021.  Comes in today due to possible fluid on his hip.  He is 25% weightbearing on the right side due to the fact during surgery the broach went through the cortex of the femur and the to have 1 Zimmer cable applied.  He has had no fevers or chills.  He states he is having severe pain and having difficulty doing leg raises on the right.  Review of systems see HPI otherwise negative  Physical exam: General well-developed well-nourished male no acute distress mood and affect appropriate.  Right hip anterior surgical incision proximal incision with significant maceration.  No purulence.  No signs of gross infection.  Distal incision is healing well.  Lateral incision is well approximated with staples this was the incision used for the hardware removal.  There is no signs of infection.  Right calf supple nontender.  Dorsiflexion plantarflexion right ankle intact.  Slight edema over the lateral hip between the incisions.  Aspiration was performed with 5 cc of serosanguineous fluid.  Pression: Status post right total hip arthroplasty with hardware excision 03/13/2021.  Plan: He will continue his 25% weightbearing status on the right lower extremity.  See him back in 1 week for wound check and possible staple removal.  In the interim he will apply Xeroform over the anterior incision proximally and place gauze with Hypafix tape over this area as shown.  He can wash the incision with an antibacterial soap once daily.  He is currently on doxycycline prophylactically 100 mg twice daily.  He is on aspirin 81 mg twice daily for DVT prophylaxis.

## 2021-03-20 ENCOUNTER — Telehealth: Payer: Self-pay | Admitting: Orthopaedic Surgery

## 2021-03-20 ENCOUNTER — Other Ambulatory Visit: Payer: Self-pay | Admitting: Physician Assistant

## 2021-03-20 MED ORDER — OXYCODONE HCL 5 MG PO CAPS: 5.0000 mg | ORAL_CAPSULE | ORAL | 0 refills | Status: DC | PRN

## 2021-03-20 NOTE — Telephone Encounter (Signed)
Please advise 

## 2021-03-20 NOTE — Telephone Encounter (Signed)
Called and advised pt.

## 2021-03-20 NOTE — Telephone Encounter (Signed)
Patient called asked if he can get a call when the Rx for pain medicine is sent to the pharmacy. The number to contact patient is [phone]

## 2021-03-21 DIAGNOSIS — Z471 Aftercare following joint replacement surgery: Secondary | ICD-10-CM | POA: Diagnosis not present

## 2021-03-21 DIAGNOSIS — M1612 Unilateral primary osteoarthritis, left hip: Secondary | ICD-10-CM | POA: Diagnosis not present

## 2021-03-21 DIAGNOSIS — M47816 Spondylosis without myelopathy or radiculopathy, lumbar region: Secondary | ICD-10-CM | POA: Diagnosis not present

## 2021-03-21 DIAGNOSIS — I10 Essential (primary) hypertension: Secondary | ICD-10-CM | POA: Diagnosis not present

## 2021-03-21 DIAGNOSIS — F4323 Adjustment disorder with mixed anxiety and depressed mood: Secondary | ICD-10-CM | POA: Diagnosis not present

## 2021-03-21 DIAGNOSIS — G8929 Other chronic pain: Secondary | ICD-10-CM | POA: Diagnosis not present

## 2021-03-21 DIAGNOSIS — M5136 Other intervertebral disc degeneration, lumbar region: Secondary | ICD-10-CM | POA: Diagnosis not present

## 2021-03-21 DIAGNOSIS — Z96641 Presence of right artificial hip joint: Secondary | ICD-10-CM | POA: Diagnosis not present

## 2021-03-21 DIAGNOSIS — M47817 Spondylosis without myelopathy or radiculopathy, lumbosacral region: Secondary | ICD-10-CM | POA: Diagnosis not present

## 2021-03-21 DIAGNOSIS — M19012 Primary osteoarthritis, left shoulder: Secondary | ICD-10-CM | POA: Diagnosis not present

## 2021-03-21 DIAGNOSIS — E1151 Type 2 diabetes mellitus with diabetic peripheral angiopathy without gangrene: Secondary | ICD-10-CM | POA: Diagnosis not present

## 2021-03-21 DIAGNOSIS — M47818 Spondylosis without myelopathy or radiculopathy, sacral and sacrococcygeal region: Secondary | ICD-10-CM | POA: Diagnosis not present

## 2021-03-21 DIAGNOSIS — M4802 Spinal stenosis, cervical region: Secondary | ICD-10-CM | POA: Diagnosis not present

## 2021-03-21 DIAGNOSIS — M19011 Primary osteoarthritis, right shoulder: Secondary | ICD-10-CM | POA: Diagnosis not present

## 2021-03-21 DIAGNOSIS — E1142 Type 2 diabetes mellitus with diabetic polyneuropathy: Secondary | ICD-10-CM | POA: Diagnosis not present

## 2021-03-21 DIAGNOSIS — M170 Bilateral primary osteoarthritis of knee: Secondary | ICD-10-CM | POA: Diagnosis not present

## 2021-03-26 ENCOUNTER — Other Ambulatory Visit: Payer: Self-pay

## 2021-03-26 ENCOUNTER — Ambulatory Visit (INDEPENDENT_AMBULATORY_CARE_PROVIDER_SITE_OTHER): Payer: BC Managed Care – PPO | Admitting: Physician Assistant

## 2021-03-26 ENCOUNTER — Telehealth: Payer: Self-pay | Admitting: Orthopaedic Surgery

## 2021-03-26 ENCOUNTER — Ambulatory Visit (INDEPENDENT_AMBULATORY_CARE_PROVIDER_SITE_OTHER): Payer: BC Managed Care – PPO

## 2021-03-26 ENCOUNTER — Encounter: Payer: Self-pay | Admitting: Physician Assistant

## 2021-03-26 DIAGNOSIS — Z96641 Presence of right artificial hip joint: Secondary | ICD-10-CM | POA: Diagnosis not present

## 2021-03-26 MED ORDER — DOXYCYCLINE HYCLATE 100 MG PO CAPS: 100.0000 mg | ORAL_CAPSULE | Freq: Two times a day (BID) | ORAL | 0 refills | Status: DC

## 2021-03-26 NOTE — Telephone Encounter (Signed)
Patient called. He would like an appointment this week. His call back number is [phone]

## 2021-03-26 NOTE — Progress Notes (Signed)
°  HPI: [name] returns today for follow-up of his right hip status post hardware removal and right total hip arthroplasty.  He is on doxycycline.  He states his pain is 5 out of 10 pain at worst.  He did have a pop in his hip earlier this week but pain is dissipated since then.  He is on chronic aspirin.  Taking oxycodone for pain.  He is using a walker to ambulate.   Physical exam: General well-developed well-nourished male no acute distress mood and affect appropriate. Right hip surgical incisions are healing well.  The lateral incision is healing well without any signs of infection.  Anterior incision the proximal portion of the incision with slight maceration.  No obvious seroma.  No purulence.  No induration.  Radiographs: AP pelvis lateral view of the right hip: Bilateral hips well located.  No hardware failure.  Acute findings otherwise.  Impression: Status post right total hip arthroplasty Status post hardware removal right hip  Plan: We will continue his doxycycline.  We will continue his aspirin.  Staples removed Steri-Strips applied.  Keep proximal incision clean and dry.  We will see him back in just 2 weeks see how he is doing overall.  Sooner if there is any questions concerns.  He is now 50% weightbearing on the right lower extremity.                                                                                                                                     ++             ++

## 2021-03-26 NOTE — Telephone Encounter (Signed)
I called pt and offered him an appt for today. He stated he is still looking for a ride. He will cb to discuss when he is able to get a ride

## 2021-03-30 ENCOUNTER — Other Ambulatory Visit: Payer: Self-pay | Admitting: Orthopaedic Surgery

## 2021-03-30 ENCOUNTER — Telehealth: Payer: Self-pay | Admitting: Orthopaedic Surgery

## 2021-03-30 DIAGNOSIS — M5136 Other intervertebral disc degeneration, lumbar region: Secondary | ICD-10-CM | POA: Diagnosis not present

## 2021-03-30 DIAGNOSIS — E1151 Type 2 diabetes mellitus with diabetic peripheral angiopathy without gangrene: Secondary | ICD-10-CM | POA: Diagnosis not present

## 2021-03-30 DIAGNOSIS — M19011 Primary osteoarthritis, right shoulder: Secondary | ICD-10-CM | POA: Diagnosis not present

## 2021-03-30 DIAGNOSIS — M170 Bilateral primary osteoarthritis of knee: Secondary | ICD-10-CM | POA: Diagnosis not present

## 2021-03-30 DIAGNOSIS — G8929 Other chronic pain: Secondary | ICD-10-CM | POA: Diagnosis not present

## 2021-03-30 DIAGNOSIS — E1142 Type 2 diabetes mellitus with diabetic polyneuropathy: Secondary | ICD-10-CM | POA: Diagnosis not present

## 2021-03-30 DIAGNOSIS — F4323 Adjustment disorder with mixed anxiety and depressed mood: Secondary | ICD-10-CM | POA: Diagnosis not present

## 2021-03-30 DIAGNOSIS — M47816 Spondylosis without myelopathy or radiculopathy, lumbar region: Secondary | ICD-10-CM | POA: Diagnosis not present

## 2021-03-30 DIAGNOSIS — M1612 Unilateral primary osteoarthritis, left hip: Secondary | ICD-10-CM | POA: Diagnosis not present

## 2021-03-30 DIAGNOSIS — I10 Essential (primary) hypertension: Secondary | ICD-10-CM | POA: Diagnosis not present

## 2021-03-30 DIAGNOSIS — Z96641 Presence of right artificial hip joint: Secondary | ICD-10-CM | POA: Diagnosis not present

## 2021-03-30 DIAGNOSIS — Z471 Aftercare following joint replacement surgery: Secondary | ICD-10-CM | POA: Diagnosis not present

## 2021-03-30 DIAGNOSIS — M19012 Primary osteoarthritis, left shoulder: Secondary | ICD-10-CM | POA: Diagnosis not present

## 2021-03-30 DIAGNOSIS — M47817 Spondylosis without myelopathy or radiculopathy, lumbosacral region: Secondary | ICD-10-CM | POA: Diagnosis not present

## 2021-03-30 DIAGNOSIS — M4802 Spinal stenosis, cervical region: Secondary | ICD-10-CM | POA: Diagnosis not present

## 2021-03-30 DIAGNOSIS — M47818 Spondylosis without myelopathy or radiculopathy, sacral and sacrococcygeal region: Secondary | ICD-10-CM | POA: Diagnosis not present

## 2021-03-30 MED ORDER — OXYCODONE HCL 5 MG PO CAPS: 5.0000 mg | ORAL_CAPSULE | Freq: Four times a day (QID) | ORAL | 0 refills | Status: DC | PRN

## 2021-03-30 NOTE — Telephone Encounter (Signed)
Pt called requesting a refill of pain medication. Please call pt about this matter at [phone]. Please send to pharmacy on file.

## 2021-04-03 DIAGNOSIS — M170 Bilateral primary osteoarthritis of knee: Secondary | ICD-10-CM | POA: Diagnosis not present

## 2021-04-03 DIAGNOSIS — M47817 Spondylosis without myelopathy or radiculopathy, lumbosacral region: Secondary | ICD-10-CM | POA: Diagnosis not present

## 2021-04-03 DIAGNOSIS — M47818 Spondylosis without myelopathy or radiculopathy, sacral and sacrococcygeal region: Secondary | ICD-10-CM | POA: Diagnosis not present

## 2021-04-03 DIAGNOSIS — M19011 Primary osteoarthritis, right shoulder: Secondary | ICD-10-CM | POA: Diagnosis not present

## 2021-04-03 DIAGNOSIS — Z471 Aftercare following joint replacement surgery: Secondary | ICD-10-CM | POA: Diagnosis not present

## 2021-04-03 DIAGNOSIS — F4323 Adjustment disorder with mixed anxiety and depressed mood: Secondary | ICD-10-CM | POA: Diagnosis not present

## 2021-04-03 DIAGNOSIS — M4802 Spinal stenosis, cervical region: Secondary | ICD-10-CM | POA: Diagnosis not present

## 2021-04-03 DIAGNOSIS — I10 Essential (primary) hypertension: Secondary | ICD-10-CM | POA: Diagnosis not present

## 2021-04-03 DIAGNOSIS — E1151 Type 2 diabetes mellitus with diabetic peripheral angiopathy without gangrene: Secondary | ICD-10-CM | POA: Diagnosis not present

## 2021-04-03 DIAGNOSIS — M19012 Primary osteoarthritis, left shoulder: Secondary | ICD-10-CM | POA: Diagnosis not present

## 2021-04-03 DIAGNOSIS — M47816 Spondylosis without myelopathy or radiculopathy, lumbar region: Secondary | ICD-10-CM | POA: Diagnosis not present

## 2021-04-03 DIAGNOSIS — Z96641 Presence of right artificial hip joint: Secondary | ICD-10-CM | POA: Diagnosis not present

## 2021-04-03 DIAGNOSIS — M1612 Unilateral primary osteoarthritis, left hip: Secondary | ICD-10-CM | POA: Diagnosis not present

## 2021-04-03 DIAGNOSIS — M5136 Other intervertebral disc degeneration, lumbar region: Secondary | ICD-10-CM | POA: Diagnosis not present

## 2021-04-03 DIAGNOSIS — G8929 Other chronic pain: Secondary | ICD-10-CM | POA: Diagnosis not present

## 2021-04-03 DIAGNOSIS — E1142 Type 2 diabetes mellitus with diabetic polyneuropathy: Secondary | ICD-10-CM | POA: Diagnosis not present

## 2021-04-05 DIAGNOSIS — M4802 Spinal stenosis, cervical region: Secondary | ICD-10-CM | POA: Diagnosis not present

## 2021-04-05 DIAGNOSIS — Z471 Aftercare following joint replacement surgery: Secondary | ICD-10-CM | POA: Diagnosis not present

## 2021-04-05 DIAGNOSIS — M170 Bilateral primary osteoarthritis of knee: Secondary | ICD-10-CM | POA: Diagnosis not present

## 2021-04-05 DIAGNOSIS — M47817 Spondylosis without myelopathy or radiculopathy, lumbosacral region: Secondary | ICD-10-CM | POA: Diagnosis not present

## 2021-04-05 DIAGNOSIS — M5136 Other intervertebral disc degeneration, lumbar region: Secondary | ICD-10-CM | POA: Diagnosis not present

## 2021-04-05 DIAGNOSIS — M1612 Unilateral primary osteoarthritis, left hip: Secondary | ICD-10-CM | POA: Diagnosis not present

## 2021-04-05 DIAGNOSIS — I10 Essential (primary) hypertension: Secondary | ICD-10-CM | POA: Diagnosis not present

## 2021-04-05 DIAGNOSIS — M47816 Spondylosis without myelopathy or radiculopathy, lumbar region: Secondary | ICD-10-CM | POA: Diagnosis not present

## 2021-04-05 DIAGNOSIS — F4323 Adjustment disorder with mixed anxiety and depressed mood: Secondary | ICD-10-CM | POA: Diagnosis not present

## 2021-04-05 DIAGNOSIS — M19012 Primary osteoarthritis, left shoulder: Secondary | ICD-10-CM | POA: Diagnosis not present

## 2021-04-05 DIAGNOSIS — G8929 Other chronic pain: Secondary | ICD-10-CM | POA: Diagnosis not present

## 2021-04-05 DIAGNOSIS — M47818 Spondylosis without myelopathy or radiculopathy, sacral and sacrococcygeal region: Secondary | ICD-10-CM | POA: Diagnosis not present

## 2021-04-05 DIAGNOSIS — M19011 Primary osteoarthritis, right shoulder: Secondary | ICD-10-CM | POA: Diagnosis not present

## 2021-04-05 DIAGNOSIS — E1151 Type 2 diabetes mellitus with diabetic peripheral angiopathy without gangrene: Secondary | ICD-10-CM | POA: Diagnosis not present

## 2021-04-05 DIAGNOSIS — Z96641 Presence of right artificial hip joint: Secondary | ICD-10-CM | POA: Diagnosis not present

## 2021-04-05 DIAGNOSIS — E1142 Type 2 diabetes mellitus with diabetic polyneuropathy: Secondary | ICD-10-CM | POA: Diagnosis not present

## 2021-04-09 ENCOUNTER — Encounter: Payer: Self-pay | Admitting: Physician Assistant

## 2021-04-09 ENCOUNTER — Ambulatory Visit (INDEPENDENT_AMBULATORY_CARE_PROVIDER_SITE_OTHER): Payer: BC Managed Care – PPO | Admitting: Physician Assistant

## 2021-04-09 ENCOUNTER — Other Ambulatory Visit: Payer: Self-pay

## 2021-04-09 DIAGNOSIS — Z96641 Presence of right artificial hip joint: Secondary | ICD-10-CM

## 2021-04-09 NOTE — Progress Notes (Signed)
HPI: Mr. Stigger returns today for follow-up of his right hip.  He is status post hardware removal right hip with right total hip arthroplasty 03/13/2021.  He states he is taking only Tylenol for pain but has not taken any pain medications last 3 days.  Feels overall he is trending towards improvement.  He is ambulating with a walker states he does place more than 50% of his weight on it, but not full weightbearing as of yet.   Review of systems: Denies shortness of breath, fevers, or chills.    See HPI otherwise negative  Physical exam: Right hip good range of motion without pain.  Surgical incisions are healing well.  Calf supple nontender.  Dorsiflexion plantarflexion right ankle intact.  Impression: Status post right hip hardware removal and right total hip arthroplasty  Plan: At this point, we will keep him out of work till follow-up in 2 weeks.  At that point time we will obtain an AP pelvis and lateral view of the right hip.  He can transition to weightbearing as tolerated on the right lower extremity.  Questions were encouraged and answered at length.

## 2021-04-10 DIAGNOSIS — G4733 Obstructive sleep apnea (adult) (pediatric): Secondary | ICD-10-CM | POA: Diagnosis not present

## 2021-04-10 DIAGNOSIS — M1612 Unilateral primary osteoarthritis, left hip: Secondary | ICD-10-CM | POA: Diagnosis not present

## 2021-04-10 DIAGNOSIS — E1151 Type 2 diabetes mellitus with diabetic peripheral angiopathy without gangrene: Secondary | ICD-10-CM | POA: Diagnosis not present

## 2021-04-10 DIAGNOSIS — M19011 Primary osteoarthritis, right shoulder: Secondary | ICD-10-CM | POA: Diagnosis not present

## 2021-04-10 DIAGNOSIS — Z471 Aftercare following joint replacement surgery: Secondary | ICD-10-CM | POA: Diagnosis not present

## 2021-04-10 DIAGNOSIS — M5136 Other intervertebral disc degeneration, lumbar region: Secondary | ICD-10-CM | POA: Diagnosis not present

## 2021-04-10 DIAGNOSIS — G8929 Other chronic pain: Secondary | ICD-10-CM | POA: Diagnosis not present

## 2021-04-10 DIAGNOSIS — E1142 Type 2 diabetes mellitus with diabetic polyneuropathy: Secondary | ICD-10-CM | POA: Diagnosis not present

## 2021-04-10 DIAGNOSIS — M47817 Spondylosis without myelopathy or radiculopathy, lumbosacral region: Secondary | ICD-10-CM | POA: Diagnosis not present

## 2021-04-10 DIAGNOSIS — I10 Essential (primary) hypertension: Secondary | ICD-10-CM | POA: Diagnosis not present

## 2021-04-10 DIAGNOSIS — M170 Bilateral primary osteoarthritis of knee: Secondary | ICD-10-CM | POA: Diagnosis not present

## 2021-04-10 DIAGNOSIS — Z96641 Presence of right artificial hip joint: Secondary | ICD-10-CM | POA: Diagnosis not present

## 2021-04-10 DIAGNOSIS — F4323 Adjustment disorder with mixed anxiety and depressed mood: Secondary | ICD-10-CM | POA: Diagnosis not present

## 2021-04-10 DIAGNOSIS — M47816 Spondylosis without myelopathy or radiculopathy, lumbar region: Secondary | ICD-10-CM | POA: Diagnosis not present

## 2021-04-10 DIAGNOSIS — M19012 Primary osteoarthritis, left shoulder: Secondary | ICD-10-CM | POA: Diagnosis not present

## 2021-04-10 DIAGNOSIS — M4802 Spinal stenosis, cervical region: Secondary | ICD-10-CM | POA: Diagnosis not present

## 2021-04-10 DIAGNOSIS — M47818 Spondylosis without myelopathy or radiculopathy, sacral and sacrococcygeal region: Secondary | ICD-10-CM | POA: Diagnosis not present

## 2021-04-12 DIAGNOSIS — M19012 Primary osteoarthritis, left shoulder: Secondary | ICD-10-CM | POA: Diagnosis not present

## 2021-04-12 DIAGNOSIS — M5136 Other intervertebral disc degeneration, lumbar region: Secondary | ICD-10-CM | POA: Diagnosis not present

## 2021-04-12 DIAGNOSIS — M47816 Spondylosis without myelopathy or radiculopathy, lumbar region: Secondary | ICD-10-CM | POA: Diagnosis not present

## 2021-04-12 DIAGNOSIS — M47818 Spondylosis without myelopathy or radiculopathy, sacral and sacrococcygeal region: Secondary | ICD-10-CM | POA: Diagnosis not present

## 2021-04-12 DIAGNOSIS — M4802 Spinal stenosis, cervical region: Secondary | ICD-10-CM | POA: Diagnosis not present

## 2021-04-12 DIAGNOSIS — E1151 Type 2 diabetes mellitus with diabetic peripheral angiopathy without gangrene: Secondary | ICD-10-CM | POA: Diagnosis not present

## 2021-04-12 DIAGNOSIS — G8929 Other chronic pain: Secondary | ICD-10-CM | POA: Diagnosis not present

## 2021-04-12 DIAGNOSIS — M47817 Spondylosis without myelopathy or radiculopathy, lumbosacral region: Secondary | ICD-10-CM | POA: Diagnosis not present

## 2021-04-12 DIAGNOSIS — I10 Essential (primary) hypertension: Secondary | ICD-10-CM | POA: Diagnosis not present

## 2021-04-12 DIAGNOSIS — Z96641 Presence of right artificial hip joint: Secondary | ICD-10-CM | POA: Diagnosis not present

## 2021-04-12 DIAGNOSIS — Z471 Aftercare following joint replacement surgery: Secondary | ICD-10-CM | POA: Diagnosis not present

## 2021-04-12 DIAGNOSIS — M1612 Unilateral primary osteoarthritis, left hip: Secondary | ICD-10-CM | POA: Diagnosis not present

## 2021-04-12 DIAGNOSIS — M170 Bilateral primary osteoarthritis of knee: Secondary | ICD-10-CM | POA: Diagnosis not present

## 2021-04-12 DIAGNOSIS — M19011 Primary osteoarthritis, right shoulder: Secondary | ICD-10-CM | POA: Diagnosis not present

## 2021-04-12 DIAGNOSIS — E1142 Type 2 diabetes mellitus with diabetic polyneuropathy: Secondary | ICD-10-CM | POA: Diagnosis not present

## 2021-04-12 DIAGNOSIS — F4323 Adjustment disorder with mixed anxiety and depressed mood: Secondary | ICD-10-CM | POA: Diagnosis not present

## 2021-04-16 ENCOUNTER — Ambulatory Visit (INDEPENDENT_AMBULATORY_CARE_PROVIDER_SITE_OTHER): Payer: BC Managed Care – PPO

## 2021-04-16 ENCOUNTER — Ambulatory Visit (INDEPENDENT_AMBULATORY_CARE_PROVIDER_SITE_OTHER): Payer: BC Managed Care – PPO | Admitting: Physician Assistant

## 2021-04-16 ENCOUNTER — Other Ambulatory Visit: Payer: Self-pay

## 2021-04-16 ENCOUNTER — Telehealth: Payer: Self-pay

## 2021-04-16 ENCOUNTER — Encounter: Payer: Self-pay | Admitting: Physician Assistant

## 2021-04-16 DIAGNOSIS — Z96641 Presence of right artificial hip joint: Secondary | ICD-10-CM

## 2021-04-16 NOTE — Progress Notes (Signed)
HPI: [name] returns today status post right total hip arthroplasty 03/13/2021.  He states on Saturday he was getting out of a scooter at [facility] and felt a snap in his right hip.  He states he is having more difficulty with hip flexion since that time.  He is able to walk without any severe pain in the hip.  He is worked in today for evaluation of the right hip.   Physical exam: Right hip excellent range of motion without pain.  Weakness with right hip flexion.  Ambulates with a nonantalgic gait and without any assistive device.  Surgical incisions are healing well no signs of infection.  Radiographs: AP pelvis lateral view of the right hip: Hips well located.  No hardware failure.  No acute fractures or acute findings.  Impression status post right total hip arthroplasty 03/13/2021  Plan: We will keep him out of work for another month.  We will see him back in just 2 weeks see how he is doing overall.  He is to continue to walk for exercise.  Discussed with him that more than likely it is represents a muscle strain versus a stitch that popped in his hip.  We will see him back sooner if there is any questions concerns.  No radiographs at next office visit.

## 2021-04-16 NOTE — Telephone Encounter (Signed)
Patient called stating that he felt a snap in his right hip Saturday after getting up from the wheelchair while being out.  Stated that he can walk, but it hurts to move his right leg up and down. Had right hip surgery on 03/13/2021.  Would like to know if he needs to be seen sooner or just wait until his appointment on Thursday?  CB# [phone]

## 2021-04-16 NOTE — Telephone Encounter (Signed)
Called and scheduled to come see gil this afternoon

## 2021-04-19 ENCOUNTER — Encounter: Payer: BC Managed Care – PPO | Admitting: Physician Assistant

## 2021-04-19 DIAGNOSIS — M1612 Unilateral primary osteoarthritis, left hip: Secondary | ICD-10-CM | POA: Diagnosis not present

## 2021-04-19 DIAGNOSIS — M170 Bilateral primary osteoarthritis of knee: Secondary | ICD-10-CM | POA: Diagnosis not present

## 2021-04-19 DIAGNOSIS — M19011 Primary osteoarthritis, right shoulder: Secondary | ICD-10-CM | POA: Diagnosis not present

## 2021-04-19 DIAGNOSIS — I10 Essential (primary) hypertension: Secondary | ICD-10-CM | POA: Diagnosis not present

## 2021-04-19 DIAGNOSIS — E1142 Type 2 diabetes mellitus with diabetic polyneuropathy: Secondary | ICD-10-CM | POA: Diagnosis not present

## 2021-04-19 DIAGNOSIS — Z471 Aftercare following joint replacement surgery: Secondary | ICD-10-CM | POA: Diagnosis not present

## 2021-04-19 DIAGNOSIS — E1151 Type 2 diabetes mellitus with diabetic peripheral angiopathy without gangrene: Secondary | ICD-10-CM | POA: Diagnosis not present

## 2021-04-19 DIAGNOSIS — G8929 Other chronic pain: Secondary | ICD-10-CM | POA: Diagnosis not present

## 2021-04-19 DIAGNOSIS — Z96641 Presence of right artificial hip joint: Secondary | ICD-10-CM | POA: Diagnosis not present

## 2021-04-19 DIAGNOSIS — M47817 Spondylosis without myelopathy or radiculopathy, lumbosacral region: Secondary | ICD-10-CM | POA: Diagnosis not present

## 2021-04-19 DIAGNOSIS — M4802 Spinal stenosis, cervical region: Secondary | ICD-10-CM | POA: Diagnosis not present

## 2021-04-19 DIAGNOSIS — M19012 Primary osteoarthritis, left shoulder: Secondary | ICD-10-CM | POA: Diagnosis not present

## 2021-04-19 DIAGNOSIS — M47818 Spondylosis without myelopathy or radiculopathy, sacral and sacrococcygeal region: Secondary | ICD-10-CM | POA: Diagnosis not present

## 2021-04-19 DIAGNOSIS — F4323 Adjustment disorder with mixed anxiety and depressed mood: Secondary | ICD-10-CM | POA: Diagnosis not present

## 2021-04-19 DIAGNOSIS — M47816 Spondylosis without myelopathy or radiculopathy, lumbar region: Secondary | ICD-10-CM | POA: Diagnosis not present

## 2021-04-19 DIAGNOSIS — M5136 Other intervertebral disc degeneration, lumbar region: Secondary | ICD-10-CM | POA: Diagnosis not present

## 2021-04-26 DIAGNOSIS — M5136 Other intervertebral disc degeneration, lumbar region: Secondary | ICD-10-CM | POA: Diagnosis not present

## 2021-04-26 DIAGNOSIS — E1142 Type 2 diabetes mellitus with diabetic polyneuropathy: Secondary | ICD-10-CM | POA: Diagnosis not present

## 2021-04-26 DIAGNOSIS — M47816 Spondylosis without myelopathy or radiculopathy, lumbar region: Secondary | ICD-10-CM | POA: Diagnosis not present

## 2021-04-26 DIAGNOSIS — E1151 Type 2 diabetes mellitus with diabetic peripheral angiopathy without gangrene: Secondary | ICD-10-CM | POA: Diagnosis not present

## 2021-04-26 DIAGNOSIS — G8929 Other chronic pain: Secondary | ICD-10-CM | POA: Diagnosis not present

## 2021-04-26 DIAGNOSIS — M1612 Unilateral primary osteoarthritis, left hip: Secondary | ICD-10-CM | POA: Diagnosis not present

## 2021-04-26 DIAGNOSIS — I10 Essential (primary) hypertension: Secondary | ICD-10-CM | POA: Diagnosis not present

## 2021-04-26 DIAGNOSIS — M19012 Primary osteoarthritis, left shoulder: Secondary | ICD-10-CM | POA: Diagnosis not present

## 2021-04-26 DIAGNOSIS — Z471 Aftercare following joint replacement surgery: Secondary | ICD-10-CM | POA: Diagnosis not present

## 2021-04-26 DIAGNOSIS — M170 Bilateral primary osteoarthritis of knee: Secondary | ICD-10-CM | POA: Diagnosis not present

## 2021-04-26 DIAGNOSIS — F4323 Adjustment disorder with mixed anxiety and depressed mood: Secondary | ICD-10-CM | POA: Diagnosis not present

## 2021-04-26 DIAGNOSIS — M47818 Spondylosis without myelopathy or radiculopathy, sacral and sacrococcygeal region: Secondary | ICD-10-CM | POA: Diagnosis not present

## 2021-04-26 DIAGNOSIS — M47817 Spondylosis without myelopathy or radiculopathy, lumbosacral region: Secondary | ICD-10-CM | POA: Diagnosis not present

## 2021-04-26 DIAGNOSIS — Z96641 Presence of right artificial hip joint: Secondary | ICD-10-CM | POA: Diagnosis not present

## 2021-04-26 DIAGNOSIS — M19011 Primary osteoarthritis, right shoulder: Secondary | ICD-10-CM | POA: Diagnosis not present

## 2021-04-26 DIAGNOSIS — M4802 Spinal stenosis, cervical region: Secondary | ICD-10-CM | POA: Diagnosis not present

## 2021-04-30 ENCOUNTER — Ambulatory Visit (INDEPENDENT_AMBULATORY_CARE_PROVIDER_SITE_OTHER): Payer: BC Managed Care – PPO | Admitting: Physician Assistant

## 2021-04-30 ENCOUNTER — Encounter: Payer: Self-pay | Admitting: Physician Assistant

## 2021-04-30 DIAGNOSIS — Z96641 Presence of right artificial hip joint: Secondary | ICD-10-CM

## 2021-04-30 MED ORDER — TRAMADOL HCL 50 MG PO TABS
50.0000 mg | ORAL_TABLET | Freq: Four times a day (QID) | ORAL | 0 refills | Status: DC | PRN
Start: 2021-04-30 — End: 2021-10-22

## 2021-04-30 NOTE — Progress Notes (Signed)
HPI: [name] returns today status post right total hip arthroplasty 03/13/2021.  He states that the pulled muscle pain is gone but he is having soreness and constant pain in the hip.  For the states the pain is worse than he had before surgery.  He is walking with a cane.  He has had no new injuries.  States that his hip incisions well-healed.  No fevers chills.  Physical exam: Right hip fluid range of motion with internal/external rotation.  He is able to actively flex his hip.  Calf supple nontender.  Impression: Status post right total hip arthroplasty  Plan: We will keep him out of work for another month.  See him back in 1 month to reevaluate his work status.  No radiographs at next office visit unless clinically indicated.  Questions were encouraged and answered.  He did did ask for pain medication we will place him on tramadol.

## 2021-05-09 DIAGNOSIS — L732 Hidradenitis suppurativa: Secondary | ICD-10-CM | POA: Diagnosis not present

## 2021-05-16 ENCOUNTER — Ambulatory Visit (INDEPENDENT_AMBULATORY_CARE_PROVIDER_SITE_OTHER): Payer: BC Managed Care – PPO | Admitting: Physician Assistant

## 2021-05-16 ENCOUNTER — Encounter: Payer: Self-pay | Admitting: Physician Assistant

## 2021-05-16 ENCOUNTER — Encounter: Payer: BC Managed Care – PPO | Admitting: Physician Assistant

## 2021-05-16 DIAGNOSIS — Z96641 Presence of right artificial hip joint: Secondary | ICD-10-CM

## 2021-05-16 DIAGNOSIS — M2170 Unequal limb length (acquired), unspecified site: Secondary | ICD-10-CM

## 2021-05-16 NOTE — Progress Notes (Signed)
HPI: [name] returns today follow-up right total hip arthroplasty 03/13/2021.  He feels that his leg length discrepancy is causing him to have pain in his right leg.  He is concerned about getting back to work but wants to get back to work.  He has had no fevers chills. ? ?Physical exam: Right hip fluid range of motion.  Calf supple nontender dorsiflexion plantarflexion right ankle intact.  Leg length discrepancy approximately an inch with the left leg being shorter than the right. ? ?Impression: Status post right total hip arthroplasty 03/13/2021 ?Leg length discrepancy acquired ? ?Plan: He is given a prescription to get lift for his left shoe.  Per his request we will have him return to work full duties as of April 3.  We will see him back in 1 month.  Questions were encouraged and answered at length. ?

## 2021-05-28 ENCOUNTER — Encounter: Payer: BC Managed Care – PPO | Admitting: Physician Assistant

## 2021-06-05 ENCOUNTER — Other Ambulatory Visit: Payer: Self-pay | Admitting: Physical Medicine and Rehabilitation

## 2021-06-05 ENCOUNTER — Telehealth: Payer: Self-pay | Admitting: Physical Medicine and Rehabilitation

## 2021-06-05 DIAGNOSIS — M25552 Pain in left hip: Secondary | ICD-10-CM

## 2021-06-05 NOTE — Telephone Encounter (Signed)
Patient called. He would like an appointment with Dr. Newton.  

## 2021-06-13 ENCOUNTER — Encounter: Payer: BC Managed Care – PPO | Admitting: Physician Assistant

## 2021-06-28 DIAGNOSIS — G4733 Obstructive sleep apnea (adult) (pediatric): Secondary | ICD-10-CM | POA: Diagnosis not present

## 2021-07-02 DIAGNOSIS — Z125 Encounter for screening for malignant neoplasm of prostate: Secondary | ICD-10-CM | POA: Diagnosis not present

## 2021-07-02 DIAGNOSIS — I10 Essential (primary) hypertension: Secondary | ICD-10-CM | POA: Diagnosis not present

## 2021-07-02 DIAGNOSIS — E1151 Type 2 diabetes mellitus with diabetic peripheral angiopathy without gangrene: Secondary | ICD-10-CM | POA: Diagnosis not present

## 2021-07-05 DIAGNOSIS — Z1339 Encounter for screening examination for other mental health and behavioral disorders: Secondary | ICD-10-CM | POA: Diagnosis not present

## 2021-07-05 DIAGNOSIS — E1151 Type 2 diabetes mellitus with diabetic peripheral angiopathy without gangrene: Secondary | ICD-10-CM | POA: Diagnosis not present

## 2021-07-05 DIAGNOSIS — Z0000 Encounter for general adult medical examination without abnormal findings: Secondary | ICD-10-CM | POA: Diagnosis not present

## 2021-07-05 DIAGNOSIS — Z1331 Encounter for screening for depression: Secondary | ICD-10-CM | POA: Diagnosis not present

## 2021-07-10 ENCOUNTER — Encounter: Payer: Self-pay | Admitting: Physical Medicine and Rehabilitation

## 2021-07-10 ENCOUNTER — Ambulatory Visit: Payer: Self-pay

## 2021-07-10 ENCOUNTER — Ambulatory Visit (INDEPENDENT_AMBULATORY_CARE_PROVIDER_SITE_OTHER): Payer: BC Managed Care – PPO | Admitting: Physical Medicine and Rehabilitation

## 2021-07-10 DIAGNOSIS — M25552 Pain in left hip: Secondary | ICD-10-CM | POA: Diagnosis not present

## 2021-07-10 NOTE — Progress Notes (Signed)
  Numeric Pain Rating Scale and Functional Assessment Average Pain 5   In the last MONTH (on 0-10 scale) has pain interfered with the following?  1. General activity like being  able to carry out your everyday physical activities such as walking, climbing stairs, carrying groceries, or moving a chair?  Rating(8)   +Driver, -BT, -Dye Allergies.  

## 2021-07-29 MED ORDER — TRIAMCINOLONE ACETONIDE 40 MG/ML IJ SUSP
60.0000 mg | INTRAMUSCULAR | Status: AC | PRN
  Administered 2021-07-10: 60 mg via INTRA_ARTICULAR

## 2021-07-29 MED ORDER — BUPIVACAINE HCL 0.25 % IJ SOLN
4.0000 mL | INTRAMUSCULAR | Status: AC | PRN
  Administered 2021-07-10: 4 mL via INTRA_ARTICULAR

## 2021-07-29 NOTE — Progress Notes (Signed)
   Vaiden Adames - 46 y.o. male MRN [id number]  Date of birth: [birth year]  Office Visit Note: Visit Date: 07/10/2021 PCP: Garlan Fillers, MD Referred by: Garlan Fillers, MD  Subjective: Chief Complaint  Patient presents with   Left Hip - Pain   HPI:  Chrishon Martino is a 46 y.o. male who comes in today at the request of Rexene Edison, PA-C for planned Left anesthetic hip arthrogram with fluoroscopic guidance.  The patient has failed conservative care including home exercise, medications, time and activity modification.  This injection will be diagnostic and hopefully therapeutic.  Please see requesting physician notes for further details and justification.  ROS Otherwise per HPI.  Assessment & Plan: Visit Diagnoses:    ICD-10-CM   1. Pain in left hip  M25.552 XR C-ARM NO REPORT      Plan: No additional findings.   Meds & Orders: No orders of the defined types were placed in this encounter.   Orders Placed This Encounter  Procedures   Large Joint Inj   XR C-ARM NO REPORT    Follow-up: Return for visit to requesting provider as needed.   Procedures: Large Joint Inj: L hip joint on 07/10/2021 10:15 AM Indications: diagnostic evaluation and pain Details: 22 G 3.5 in needle, fluoroscopy-guided anterior approach  Arthrogram: No  Medications: 4 mL bupivacaine 0.25 %; 60 mg triamcinolone acetonide 40 MG/ML Outcome: tolerated well, no immediate complications  There was excellent flow of contrast producing a partial arthrogram of the hip. The patient did have relief of symptoms during the anesthetic phase of the injection. Procedure, treatment alternatives, risks and benefits explained, specific risks discussed. Consent was given by the patient. Immediately prior to procedure a time out was called to verify the correct patient, procedure, equipment, support staff and site/side marked as required. Patient was prepped and draped in the usual sterile fashion.         Clinical  History: No specialty comments available.     Objective:  VS:  HT:    WT:   BMI:     BP:   HR: bpm  TEMP: ( )  RESP:  Physical Exam   Imaging: No results found.

## 2021-10-22 ENCOUNTER — Ambulatory Visit (HOSPITAL_COMMUNITY)
Admission: EM | Admit: 2021-10-22 | Discharge: 2021-10-22 | Disposition: A | Discharge status: Home / Self Care | Payer: BC Managed Care – PPO | Attending: Emergency Medicine | Admitting: Emergency Medicine

## 2021-10-22 ENCOUNTER — Encounter (HOSPITAL_COMMUNITY): Payer: Self-pay

## 2021-10-22 DIAGNOSIS — N61 Mastitis without abscess: Secondary | ICD-10-CM

## 2021-10-22 DIAGNOSIS — N611 Abscess of the breast and nipple: Secondary | ICD-10-CM

## 2021-10-22 MED ORDER — LIDOCAINE HCL (PF) 1 % IJ SOLN
INTRAMUSCULAR | Status: AC
  Filled 2021-10-22: qty 2

## 2021-10-22 MED ORDER — CEPHALEXIN 500 MG PO CAPS: 500.0000 mg | ORAL_CAPSULE | Freq: Three times a day (TID) | ORAL | 0 refills | Status: DC

## 2021-10-22 MED ORDER — CEFTRIAXONE SODIUM 1 G IJ SOLR
INTRAMUSCULAR | Status: AC
  Filled 2021-10-22: qty 10

## 2021-10-22 MED ORDER — CEFTRIAXONE SODIUM 1 G IJ SOLR
1.0000 g | Freq: Once | INTRAMUSCULAR | Status: AC
  Administered 2021-10-22: 1 g via INTRAMUSCULAR

## 2021-10-22 MED ORDER — CEPHALEXIN 500 MG PO CAPS: 500.0000 mg | ORAL_CAPSULE | Freq: Three times a day (TID) | ORAL | 0 refills | Status: AC

## 2021-10-22 MED ORDER — SULFAMETHOXAZOLE-TRIMETHOPRIM 800-160 MG PO TABS: 1.0000 | ORAL_TABLET | Freq: Two times a day (BID) | ORAL | 0 refills | Status: AC

## 2021-10-22 NOTE — ED Provider Notes (Signed)
[facility]    CSN: [id number] Arrival date & time: 10/22/21  7829     History   Chief Complaint Chief Complaint  Patient presents with   Abscess    HPI [name] is a 46 y.o. male.  Presents with 14-month history of left breast abscess.  Reports it has been coming and going but recently became worse over the last week.  He has noticed lots of drainage, redness, warmth, pain to the area.  He was started on doxycycline on Friday by his primary care provider. Notes no improvement in symptoms.  Denies fever, chills, shortness of breath, chest pain, abdominal pain, vomiting/diarrhea. He was originally supposed to have surgery for abscess removal but lost his insurance and had to cancel. History of multiple abscess, HS  Reports he is going out of town for the next 2 weeks.  Past Medical History:  Diagnosis Date   Acute pancreatitis 07/2010   Anxiety    Arthritis    "lower back and shoulders" (10/07/2017)   Chronic lower back pain    Dyslipidemia    Dyspnea    Headache    "weekly" (10/07/2017)   Hypercholesteremia    Hypertension    MRSA (methicillin resistant staph aureus) culture positive    Obesity    Obesity    OSA on CPAP    Peripheral neuropathy    Scoliosis    Sleep apnea    "can't tolerate any of the masks" (10/07/2017)   Sleep apnea    Stroke (HCC)    stroke-like 2.5 years ago   TIA (transient ischemic attack)    Type II diabetes mellitus (HCC)    Umbilical hernia     Patient Active Problem List   Diagnosis Date Noted   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/01/2020   Unilateral primary osteoarthritis, right hip 10/28/2019   Unilateral primary osteoarthritis, left hip 10/28/2019   Retained orthopedic hardware 10/28/2019   Pain in right leg 09/09/2019   Shoulder joint pain 09/09/2019   Insomnia 07/20/2019   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   Neck pain 03/11/2019   Encounter for  general adult medical examination without abnormal findings 07/10/2018   Peripheral vascular disease (HCC) 07/10/2018   Thrombocytopenia, unspecified (HCC) 07/03/2018   Type 2 diabetes mellitus without complication (HCC) 07/03/2018   OSA (obstructive sleep apnea) 07/03/2018   Excessive daytime sleepiness 07/03/2018   Class 3 severe obesity due to excess calories with serious comorbidity and body mass index (BMI) of 40.0 to 44.9 in adult (HCC) 07/03/2018   Non-seasonal allergic rhinitis due to pollen 07/03/2018   Anxiety disorder 06/05/2018   Chronic rhinitis 03/10/2018   Acute embolism and thrombosis of unspecified deep veins of right lower extremity (HCC) 02/19/2018   Plantar fascial fibromatosis 10/22/2017   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   TIA (transient ischemic attack) 10/07/2017   Urticaria 06/27/2017   Long term (current) use of insulin (HCC) 06/17/2017   Plantar wart 03/13/2017   ED (erectile dysfunction) of organic origin 12/13/2016   Degeneration of lumbosacral intervertebral disc 10/31/2016   Tobacco user 10/02/2016   Adjustment disorder with depressed mood 07/30/2016   Diabetic peripheral neuropathy associated with type 2 diabetes mellitus (HCC) 07/30/2016   Essential hypertension 07/30/2016   Fatigue 07/30/2016   Lipoprotein deficiency disorder 07/30/2016   Umbilical hernia 07/30/2016    Past Surgical History:  Procedure Laterality Date   HARDWARE REMOVAL Right 03/13/2021   Procedure:  Right Hip HARDWARE REMOVAL;  Surgeon: Kathryne Hitch, MD;  Location: [facility] OR;  Service: Orthopedics;  Laterality: Right;   HIP PINNING Bilateral    "they are worn out" (10/07/2017)   PICC LINE INSERTION  07/2010   "in hospital for ~ 2 months; took PICC out when I was discharged"   POSTERIOR CERVICAL FUSION/FORAMINOTOMY N/A 05/26/2019   Procedure: POSTERIOR CERVICAL FUSION WITH LATERAL MASS FIXATION CERVICAL THREE- THORACIC ONE;  Surgeon: Bedelia Person, MD;  Location:  [facility] OR;  Service: Neurosurgery;  Laterality: N/A;  posterior, Cervical/Thoracic   TESTICLE REMOVAL Right ~ 2012   "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"   TOTAL HIP ARTHROPLASTY Right 03/13/2021   Procedure: Right TOTAL HIP ARTHROPLASTY ANTERIOR APPROACH;  Surgeon: Kathryne Hitch, MD;  Location: [facility];  Service: Orthopedics;  Laterality: Right;       Home Medications    Prior to Admission medications   Medication Sig Start Date End Date Taking? Authorizing Provider  sulfamethoxazole-trimethoprim (BACTRIM DS) 800-160 MG tablet Take 1 tablet by mouth 2 (two) times daily for 10 days. 10/22/21 11/01/21 Yes Dontrey Snellgrove, Lurena Joiner, PA-C  amLODipine (NORVASC) 10 MG tablet Take 1 tablet (10 mg total) by mouth daily. 10/07/17   Nyra Market, MD  aspirin 81 MG chewable tablet Chew 1 tablet (81 mg total) by mouth 2 (two) times daily. 03/16/21   Kathryne Hitch, MD  carvedilol (COREG) 6.25 MG tablet Take 6.25 mg by mouth 2 (two) times daily with a meal. 12/17/19   [provider]  cephALEXin (KEFLEX) 500 MG capsule Take 1 capsule (500 mg total) by mouth 3 (three) times daily for 10 days. 10/23/21 11/02/21  Conya Ellinwood, Lurena Joiner, PA-C  Continuous Blood Gluc Receiver (FREESTYLE LIBRE 14 DAY READER) DEVI use to monitor blood gluocse continuously 08/06/17   [provider]  Continuous Blood Gluc Sensor (FREESTYLE LIBRE 14 DAY SENSOR) MISC Apply topically every 14 (fourteen) days. 04/07/20   [provider]  Dulaglutide (TRULICITY) 1.5 MG/0.5ML SOPN Inject 1.5 mg into the skin every Saturday. 12/10/19   [provider]  escitalopram (LEXAPRO) 20 MG tablet Take 20 mg by mouth daily.    [provider]  gabapentin (NEURONTIN) 300 MG capsule Take 300-600 mg by mouth 3 (three) times daily.    [provider]  HUMALOG KWIKPEN 100 UNIT/ML KwikPen Inject 15 Units into the skin 3 (three) times daily before meals. 06/12/18   [provider]   insulin glargine, 1 Unit Dial, (TOUJEO) 300 UNIT/ML Solostar Pen Inject 60 Units into the skin 2 (two) times daily.    [provider]  Insulin Pen Needle (BD PEN NEEDLE NANO U/F) 32G X 4 MM MISC Use with insulin pens 4 times daily 06/17/17   [provider]  metFORMIN (GLUCOPHAGE-XR) 500 MG 24 hr tablet Take 2 tablets (1,000 mg total) by mouth 2 (two) times daily with a meal. 10/07/17   Nyra Market, MD  Multiple Vitamins-Minerals (AIRBORNE) CHEW Chew 3 tablets by mouth at bedtime.    [provider]  propranolol (INDERAL) 10 MG tablet Take 10 mg by mouth 3 (three) times daily.    [provider]  tiZANidine (ZANAFLEX) 4 MG tablet Take 1 tablet (4 mg total) by mouth every 8 (eight) hours as needed for muscle spasms. 03/16/21   Kathryne Hitch, MD    Family History Family History  Problem Relation Age of Onset   Hypertension Mother    Hypertension Father  Sleep apnea Neg Hx     Social History Social History   Tobacco Use   Smoking status: Former    Packs/day: 0.50    Years: 20.00    Total pack years: 10.00    Types: Cigarettes   Smokeless tobacco: Former    Types: Snuff   Tobacco comments:    10/07/2017 "quit years ago"  Vaping Use   Vaping Use: Some days  Substance Use Topics   Alcohol use: Yes    Comment: rare   Drug use: No     Allergies   Losartan   Review of Systems Review of Systems Per HPI  Physical Exam Triage Vital Signs ED Triage Vitals [10/22/21 0926]  Enc Vitals Group     BP (!) 147/93     Pulse Rate 62     Resp 18     Temp 97.9 F (36.6 C)     Temp Source Oral     SpO2 100 %     Weight      Height      Head Circumference      Peak Flow      Pain Score 4     Pain Loc      Pain Edu?      Excl. in [place]?    No data found.  Updated Vital Signs BP (!) 147/93 (BP Location: Left Arm)   Pulse 62   Temp 97.9 F (36.6 C) (Oral)   Resp 18   SpO2 100%   Physical Exam Vitals and nursing note  reviewed.  Constitutional:      General: He is not in acute distress.    Appearance: Normal appearance.  HENT:     Mouth/Throat:     Pharynx: Oropharynx is clear.  Eyes:     Conjunctiva/sclera: Conjunctivae normal.  Cardiovascular:     Rate and Rhythm: Normal rate and regular rhythm.     Pulses: Normal pulses.     Heart sounds: Normal heart sounds.  Pulmonary:     Effort: Pulmonary effort is normal. No respiratory distress.     Breath sounds: Normal breath sounds.  Chest:  Breasts:    Right: Normal.     Left: Mass, skin change and tenderness present.       Comments: Area of warmth, erythema (red circle) surrounding firm abscess, opening with drainage (black circle). Abscess is just superior to the nipple. Purulent and bloody drainage expressed Lymphadenopathy:     Cervical: No cervical adenopathy.  Neurological:     Mental Status: He is alert and oriented to person, place, and time.      UC Treatments / Results  Labs (all labs ordered are listed, but only abnormal results are displayed) Labs Reviewed - No data to display  EKG   Radiology No results found.  Procedures Procedures   Medications Ordered in UC Medications  cefTRIAXone (ROCEPHIN) injection 1 g (1 g Intramuscular Given 10/22/21 1038)    Initial Impression / Assessment and Plan / UC Course  I have reviewed the triage vital signs and the nursing notes.  Pertinent labs & imaging results that were available during my care of the patient were reviewed by me and considered in my medical decision making (see chart for details).  Rocephin 1g IM given in clinic. Will discontinue doxy, switch to bactrim twice daily x 10 days for MRSA coverage. Add Keflex 3x daily for surrounding cellulitis, 10 days. Recommend patient have breast ultrasound done if possible while he is  traveling. Discussed ED precautions, patient verbalizes understanding and agrees to plan  Final Clinical Impressions(s) / UC Diagnoses    Final diagnoses:  Left breast abscess  Cellulitis of left breast     Discharge Instructions      Discontinue your doxycycline. Please take the Bactrim twice daily for 10 days, starting today.  Please take the Keflex three times daily for 10 days, starting tomorrow (Tuesday the 22nd).  I recommend to take both medications with food to avoid upset stomach.  If you can have an ultrasound of the breast performed while you are away, I very much recommend it. Otherwise, you can return to urgent care when you come back to [facility] and we can schedule one for you.  Please go to the emergency department if symptoms worsen.    ED Prescriptions     Medication Sig Dispense Auth. Provider   sulfamethoxazole-trimethoprim (BACTRIM DS) 800-160 MG tablet Take 1 tablet by mouth 2 (two) times daily for 10 days. 20 tablet Jontrell Bushong, PA-C         cephALEXin (KEFLEX) 500 MG capsule Take 1 capsule (500 mg total) by mouth 3 (three) times daily for 10 days. 30 capsule Hannah Strader, Lurena Joiner, PA-C      PDMP not reviewed this encounter.   Donie Moulton, Ray Church 10/22/21 1040

## 2021-10-22 NOTE — ED Triage Notes (Addendum)
Pt c/o recurring abscess to lt breast x1wk with redness and drainage. States started doxy on Friday.

## 2021-10-22 NOTE — Discharge Instructions (Addendum)
Discontinue your doxycycline. Please take the Bactrim twice daily for 10 days, starting today.  Please take the Keflex three times daily for 10 days, starting tomorrow (Tuesday the 22nd).  I recommend to take both medications with food to avoid upset stomach.  If you can have an ultrasound of the breast performed while you are away, I very much recommend it. Otherwise, you can return to urgent care when you come back to [facility] and we can schedule one for you.  Please go to the emergency department if symptoms worsen.

## 2021-10-31 ENCOUNTER — Ambulatory Visit: Payer: BC Managed Care – PPO | Admitting: Adult Health

## 2022-01-28 ENCOUNTER — Telehealth: Payer: Self-pay | Admitting: Physical Medicine and Rehabilitation

## 2022-01-28 DIAGNOSIS — M25552 Pain in left hip: Secondary | ICD-10-CM

## 2022-01-28 NOTE — Telephone Encounter (Signed)
Pt called to set an appt for hip injection. Please call pt at [phone]

## 2022-01-31 NOTE — Telephone Encounter (Signed)
Spoke with patient and he stated he needs another hip injection. Last injection was 07/10/21. He has had no injuries, falls or accidents. Please advise

## 2022-05-03 DIAGNOSIS — G4733 Obstructive sleep apnea (adult) (pediatric): Secondary | ICD-10-CM | POA: Diagnosis not present

## 2022-05-09 ENCOUNTER — Encounter: Payer: Self-pay | Admitting: Radiology

## 2022-05-13 ENCOUNTER — Ambulatory Visit: Payer: Self-pay | Admitting: Adult Health

## 2022-05-24 ENCOUNTER — Ambulatory Visit
Admission: EM | Admit: 2022-05-24 | Discharge: 2022-05-24 | Disposition: A | Discharge status: Home / Self Care | Payer: BC Managed Care – PPO

## 2022-05-24 ENCOUNTER — Encounter: Payer: Self-pay | Admitting: Emergency Medicine

## 2022-05-24 DIAGNOSIS — N611 Abscess of the breast and nipple: Secondary | ICD-10-CM

## 2022-05-24 DIAGNOSIS — N644 Mastodynia: Secondary | ICD-10-CM | POA: Diagnosis not present

## 2022-05-24 MED ORDER — SULFAMETHOXAZOLE-TRIMETHOPRIM 800-160 MG PO TABS: 1.0000 | ORAL_TABLET | Freq: Two times a day (BID) | ORAL | 0 refills | Status: AC

## 2022-05-24 NOTE — ED Provider Notes (Signed)
[facility]    CSN: [id number] Arrival date & time: 05/24/22  0949      History   Chief Complaint Chief Complaint  Patient presents with   Abscess    HPI [name] is a 47 y.o. male.   66-year-old male presents with abscess to left breast area with redness.  Patient indicates he has a history of having an abscess and cystic formation on the left breast area.  Patient indicates that he does have an appointment with a surgeon to have the area surgically removed.  He indicates the appointment is June 11, 2022.  Patient indicates that over the past week the cystic area has become red, inflamed, tender, and has started draining this morning.  Patient indicates he is without fever or chills.  He indicates he is not diabetic.  He does indicate that he has been taking doxycycline 100 mg twice daily to try to treat the infection, he indicates he has not had improvement.  He is without nausea vomiting and is tolerating fluids well.   Abscess   Past Medical History:  Diagnosis Date   Acute pancreatitis 07/2010   Anxiety    Arthritis    "lower back and shoulders" (10/07/2017)   Chronic lower back pain    Dyslipidemia    Dyspnea    Headache    "weekly" (10/07/2017)   Hypercholesteremia    Hypertension    MRSA (methicillin resistant staph aureus) culture positive    Obesity    Obesity    OSA on CPAP    Peripheral neuropathy    Scoliosis    Sleep apnea    "can't tolerate any of the masks" (10/07/2017)   Sleep apnea    Stroke ([place])    stroke-like 2.5 years ago   TIA (transient ischemic attack)    Type II diabetes mellitus ([place])    Umbilical hernia     Patient Active Problem List   Diagnosis Date Noted   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/01/2020   Unilateral primary osteoarthritis, right hip 10/28/2019   Unilateral primary osteoarthritis, left hip 10/28/2019   Retained orthopedic hardware 10/28/2019   Pain in right leg  09/09/2019   Shoulder joint pain 09/09/2019   Insomnia 07/20/2019   Stenosis of cervical spine with myelopathy ([place]) 05/26/2019   Neck pain 03/11/2019   Encounter for general adult medical examination without abnormal findings 07/10/2018   Peripheral vascular disease ([place]) 07/10/2018   Thrombocytopenia, unspecified ([place]) 07/03/2018   Type 2 diabetes mellitus without complication ([place]) [id number]   OSA (obstructive sleep apnea) 07/03/2018   Excessive daytime sleepiness 07/03/2018   Class 3 severe obesity due to excess calories with serious comorbidity and body mass index (BMI) of 40.0 to 44.9 in adult ([place]) 07/03/2018   Non-seasonal allergic rhinitis due to pollen 07/03/2018   Anxiety disorder 06/05/2018   Chronic rhinitis 03/10/2018   Acute embolism and thrombosis of unspecified deep veins of right lower extremity ([place]) 02/19/2018   Plantar fascial fibromatosis 10/22/2017   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   TIA (transient ischemic attack) 10/07/2017   Urticaria 06/27/2017   Long term (current) use of insulin ([place]) 06/17/2017   Plantar wart 03/13/2017   ED (erectile dysfunction) of organic origin 12/13/2016   Degeneration of lumbosacral intervertebral disc 10/31/2016   Tobacco user 10/02/2016   Adjustment disorder with depressed mood 07/30/2016   Diabetic peripheral neuropathy associated with type 2 diabetes mellitus ([place]) 07/30/2016   Essential  hypertension 07/30/2016   Fatigue 07/30/2016   Lipoprotein deficiency disorder [id number]   Umbilical hernia [id number]    Past Surgical History:  Procedure Laterality Date   HARDWARE REMOVAL Right 03/13/2021   Procedure: Right Hip HARDWARE REMOVAL;  Surgeon: Mcarthur Rossetti, MD;  Location: [place];  Service: Orthopedics;  Laterality: Right;   HIP PINNING Bilateral    "they are worn out" (10/07/2017)   PICC LINE INSERTION  07/2010   "in hospital for ~ 2 months; took PICC out when I was discharged"   POSTERIOR CERVICAL  FUSION/FORAMINOTOMY N/A 05/26/2019   Procedure: POSTERIOR CERVICAL FUSION WITH LATERAL MASS FIXATION CERVICAL THREE- THORACIC ONE;  Surgeon: Vallarie Mare, MD;  Location: [place];  Service: Neurosurgery;  Laterality: N/A;  posterior, Cervical/Thoracic   TESTICLE REMOVAL Right ~ 2012   "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"   TOTAL HIP ARTHROPLASTY Right 03/13/2021   Procedure: Right TOTAL HIP ARTHROPLASTY ANTERIOR APPROACH;  Surgeon: Mcarthur Rossetti, MD;  Location: [place];  Service: Orthopedics;  Laterality: Right;       Home Medications    Prior to Admission medications   Medication Sig Start Date End Date Taking? Authorizing Provider  doxycycline (VIBRA-TABS) 100 MG tablet Take with 8 oz water. Do not lie down for at least 30 minutes after. 04/09/22 05/14/23 Yes [provider]  hydrOXYzine (VISTARIL) 25 MG capsule Take 25 mg by mouth 2 (two) times daily as needed. 05/09/22  Yes [provider]  rosuvastatin (CRESTOR) 20 MG tablet Take 20 mg by mouth daily. 05/09/22  Yes [provider]  sulfamethoxazole-trimethoprim (BACTRIM DS) 800-160 MG tablet Take 1 tablet by mouth 2 (two) times daily for 10 days. 05/24/22 06/03/22 Yes Nyoka Lint, PA-C  amLODipine (NORVASC) 10 MG tablet Take 1 tablet (10 mg total) by mouth daily. 10/07/17   Alphonzo Grieve, MD  aspirin 81 MG chewable tablet Chew 1 tablet (81 mg total) by mouth 2 (two) times daily. 03/16/21   Mcarthur Rossetti, MD  carvedilol (COREG) 6.25 MG tablet Take 6.25 mg by mouth 2 (two) times daily with a meal. 12/17/19   [provider]  Continuous Blood Gluc Receiver (FREESTYLE LIBRE 14 DAY READER) DEVI use to monitor blood gluocse continuously 08/06/17   [provider]  Continuous Blood Gluc Sensor (FREESTYLE LIBRE 14 DAY SENSOR) MISC Apply topically every 14 (fourteen) days. 04/07/20   [provider]  Dulaglutide (TRULICITY) 1.5 0000000 SOPN Inject 1.5 mg into the  skin every Saturday. 12/10/19   [provider]  escitalopram (LEXAPRO) 20 MG tablet Take 20 mg by mouth daily.    [provider]  gabapentin (NEURONTIN) 300 MG capsule Take 300-600 mg by mouth 3 (three) times daily.    [provider]  HUMALOG KWIKPEN 100 UNIT/ML KwikPen Inject 15 Units into the skin 3 (three) times daily before meals. 06/12/18   [provider]  insulin glargine, 1 Unit Dial, (TOUJEO) 300 UNIT/ML Solostar Pen Inject 60 Units into the skin 2 (two) times daily.    [provider]  Insulin Pen Needle (BD PEN NEEDLE NANO U/F) 32G X 4 MM MISC Use with insulin pens 4 times daily 06/17/17   [provider]  metFORMIN (GLUCOPHAGE-XR) 500 MG 24 hr tablet Take 2 tablets (1,000 mg total) by mouth 2 (two) times daily with a meal. 10/07/17   Alphonzo Grieve, MD  Multiple Vitamins-Minerals (AIRBORNE) CHEW Chew 3 tablets by mouth at bedtime.    [provider]  propranolol (INDERAL) 10 MG tablet Take 10 mg by mouth 3 (three) times daily.    [provider]  tiZANidine (ZANAFLEX) 4 MG tablet Take 1 tablet (4 mg total) by mouth every 8 (eight) hours as needed for muscle spasms. 03/16/21   Mcarthur Rossetti, MD  zolpidem (AMBIEN) 10 MG tablet Take 10 mg by mouth at bedtime as needed.    [provider]    Family History Family History  Problem Relation Age of Onset   Hypertension Mother    Hypertension Father    Sleep apnea Neg Hx     Social History Social History   Tobacco Use   Smoking status: Former    Packs/day: 0.50    Years: 20.00    Additional pack years: 0.00    Total pack years: 10.00    Types: Cigarettes   Smokeless tobacco: Former    Types: Snuff   Tobacco comments:    10/07/2017 "quit years ago"  Vaping Use   Vaping Use: Some days  Substance Use Topics   Alcohol use: Yes    Comment: rare   Drug use: No     Allergies   Losartan   Review of Systems Review of Systems  Skin:   Positive for rash (left breast abscess).     Physical Exam Triage Vital Signs ED Triage Vitals [05/24/22 1122]  Enc Vitals Group     BP 105/68     Pulse Rate 74     Resp 17     Temp 97.9 F (36.6 C)     Temp Source Oral     SpO2 97 %     Weight      Height      Head Circumference      Peak Flow      Pain Score 5     Pain Loc      Pain Edu?      Excl. in [place]?    No data found.  Updated Vital Signs BP 105/68 (BP Location: Left Arm)   Pulse 74   Temp 97.9 F (36.6 C) (Oral)   Resp 17   SpO2 97%   Visual Acuity Right Eye Distance:   Left Eye Distance:   Bilateral Distance:    Right Eye Near:   Left Eye Near:    Bilateral Near:     Physical Exam Constitutional:      Appearance: Normal appearance.  Skin:         Comments: Left breast: There is an abscess present that has purulent drainage actively draining, area of redness is 5 cm x 4.5 cm along the upper medial and outer aspects of the breast adjacent to involving the nipple area.  There is no discharge from the nipple.  Mild tenderness on palpation, there is no streaking of the redness.  Neurological:     Mental Status: He is alert.      UC Treatments / Results  Labs (all labs ordered are listed, but only abnormal results are displayed) Labs Reviewed - No data to display  EKG   Radiology No results found.  Procedures Procedures (including critical care time)  Medications Ordered in UC Medications - No data to display  Initial Impression / Assessment and Plan / UC Course  I have reviewed the triage vital signs and the nursing notes.  Pertinent labs & imaging results that were available during my care of the patient were reviewed by me and considered in my  medical decision making (see chart for details).    Plan: Diagnosis be treated with the following: 1.  Left breast abscess: A.  Bactrim DS every 12 hours for 10 days to treat infection.  Patient advised to then restart the doxycycline 100  mg twice daily. 2.  Left breast pain: A.  Advised take Tylenol or ibuprofen as needed for breast pain. 3.  Patient advised to use warm compresses to the area frequently to encourage drainage. 4.  Patient advised to follow-up and keep his surgical appointment to have the area debrided and the cystic sac removed. 5.  Patient advised to return to urgent care or to the emergency room if symptoms fail to improve or worsen over the next several days. Final Clinical Impressions(s) / UC Diagnoses   Final diagnoses:  Breast abscess  Breast pain, left     Discharge Instructions      Advised to apply warm compresses to the area frequently to help encourage drainage. Advised take the Bactrim DS every 12 hours until completed to treat infection, then return to taking the doxycycline every 12 hours.  Advised to follow-up with surgical appointment to have the cystic area removed.  Advised follow-up PCP return to urgent care as needed.    ED Prescriptions     Medication Sig Dispense Auth. Provider   sulfamethoxazole-trimethoprim (BACTRIM DS) 800-160 MG tablet Take 1 tablet by mouth 2 (two) times daily for 10 days. 20 tablet Nyoka Lint, PA-C      PDMP not reviewed this encounter.   Nyoka Lint, PA-C 05/24/22 1144

## 2022-05-24 NOTE — ED Triage Notes (Signed)
Pt presents with abscess on chest 1 week. States abscess has been on chest for 3 years and has flair ups.

## 2022-05-24 NOTE — Discharge Instructions (Signed)
Advised to apply warm compresses to the area frequently to help encourage drainage. Advised take the Bactrim DS every 12 hours until completed to treat infection, then return to taking the doxycycline every 12 hours.  Advised to follow-up with surgical appointment to have the cystic area removed.  Advised follow-up PCP return to urgent care as needed.

## 2022-06-03 DIAGNOSIS — G4733 Obstructive sleep apnea (adult) (pediatric): Secondary | ICD-10-CM | POA: Diagnosis not present

## 2022-07-01 ENCOUNTER — Other Ambulatory Visit: Payer: Self-pay | Admitting: Physical Medicine and Rehabilitation

## 2022-07-01 ENCOUNTER — Telehealth: Payer: Self-pay | Admitting: Physical Medicine and Rehabilitation

## 2022-07-01 DIAGNOSIS — M25552 Pain in left hip: Secondary | ICD-10-CM

## 2022-07-01 NOTE — Telephone Encounter (Signed)
Pt called requesting a call back for back injection appt. Please call pt at [phone]

## 2022-07-01 NOTE — Telephone Encounter (Signed)
Patient is calling to request a hip injection. It is the same type of pain as before and last injection lasted until recently. Last hip injection was 07/10/21. Please advise

## 2022-07-03 DIAGNOSIS — G4733 Obstructive sleep apnea (adult) (pediatric): Secondary | ICD-10-CM | POA: Diagnosis not present

## 2022-07-09 ENCOUNTER — Other Ambulatory Visit: Payer: Self-pay

## 2022-07-09 ENCOUNTER — Ambulatory Visit: Payer: BC Managed Care – PPO | Admitting: Physical Medicine and Rehabilitation

## 2022-07-09 DIAGNOSIS — M25552 Pain in left hip: Secondary | ICD-10-CM | POA: Diagnosis not present

## 2022-07-09 NOTE — Progress Notes (Signed)
   [name] - 47 y.o. male MRN [id number]  Date of birth: 1976/02/20  Office Visit Note: Visit Date: 07/09/2022 PCP: Garlan Fillers, MD Referred by: Garlan Fillers, MD  Subjective: Chief Complaint  Patient presents with   Left Hip - Pain   HPI:  [name] is a 47 y.o. male who comes in today at the request of Dr. Doneen Poisson for planned Left anesthetic hip arthrogram with fluoroscopic guidance.  The patient has failed conservative care including home exercise, medications, time and activity modification.  This injection will be diagnostic and hopefully therapeutic.  Please see requesting physician notes for further details and justification.   ROS Otherwise per HPI.  Assessment & Plan: Visit Diagnoses:    ICD-10-CM   1. Pain in left hip  M25.552 XR C-ARM NO REPORT      Plan: No additional findings.   Meds & Orders: No orders of the defined types were placed in this encounter.   Orders Placed This Encounter  Procedures   XR C-ARM NO REPORT    Follow-up: No follow-ups on file.   Procedures: Large Joint Inj: L hip joint on 07/09/2022 8:27 AM Indications: diagnostic evaluation and pain Details: 22 G 3.5 in needle, fluoroscopy-guided anterior approach  Arthrogram: No  Medications: 4 mL bupivacaine 0.25 %; 40 mg triamcinolone acetonide 40 MG/ML Outcome: tolerated well, no immediate complications  There was excellent flow of contrast producing a partial arthrogram of the hip. The patient did have relief of symptoms during the anesthetic phase of the injection. Procedure, treatment alternatives, risks and benefits explained, specific risks discussed. Consent was given by the patient. Immediately prior to procedure a time out was called to verify the correct patient, procedure, equipment, support staff and site/side marked as required. Patient was prepped and draped in the usual sterile fashion.          Clinical History: No specialty comments available.      Objective:  VS:  HT:    WT:   BMI:     BP:   HR: bpm  TEMP: ( )  RESP:  Physical Exam   Imaging: No results found.

## 2022-07-09 NOTE — Progress Notes (Signed)
Functional Pain Scale - descriptive words and definitions  Uncomfortable (3)  Pain is present but can complete all ADL's/sleep is slightly affected and passive distraction only gives marginal relief. Mild range order  Average Pain 2-3, 9 when walking   +Driver, -BT, -Dye Allergies.  Left hip pain. Walking makes pain worse

## 2022-07-16 MED ORDER — BUPIVACAINE HCL 0.25 % IJ SOLN
4.0000 mL | INTRAMUSCULAR | Status: AC | PRN
  Administered 2022-07-09: 4 mL via INTRA_ARTICULAR

## 2022-07-16 MED ORDER — TRIAMCINOLONE ACETONIDE 40 MG/ML IJ SUSP
40.0000 mg | INTRAMUSCULAR | Status: AC | PRN
  Administered 2022-07-09: 40 mg via INTRA_ARTICULAR

## 2022-07-23 DIAGNOSIS — L732 Hidradenitis suppurativa: Secondary | ICD-10-CM | POA: Diagnosis not present

## 2022-07-23 DIAGNOSIS — Z5181 Encounter for therapeutic drug level monitoring: Secondary | ICD-10-CM | POA: Diagnosis not present

## 2022-07-28 DIAGNOSIS — L732 Hidradenitis suppurativa: Secondary | ICD-10-CM | POA: Insufficient documentation

## 2022-08-28 DIAGNOSIS — G4733 Obstructive sleep apnea (adult) (pediatric): Secondary | ICD-10-CM | POA: Diagnosis not present

## 2022-09-19 DIAGNOSIS — E1140 Type 2 diabetes mellitus with diabetic neuropathy, unspecified: Secondary | ICD-10-CM | POA: Diagnosis not present

## 2022-09-19 DIAGNOSIS — Z125 Encounter for screening for malignant neoplasm of prostate: Secondary | ICD-10-CM | POA: Diagnosis not present

## 2022-09-27 DIAGNOSIS — G4733 Obstructive sleep apnea (adult) (pediatric): Secondary | ICD-10-CM | POA: Diagnosis not present

## 2022-10-15 ENCOUNTER — Encounter (HOSPITAL_COMMUNITY): Payer: Self-pay

## 2022-10-15 ENCOUNTER — Ambulatory Visit (HOSPITAL_COMMUNITY)
Admission: EM | Admit: 2022-10-15 | Discharge: 2022-10-15 | Disposition: A | Discharge status: Home / Self Care | Payer: BC Managed Care – PPO | Attending: Internal Medicine | Admitting: Internal Medicine

## 2022-10-15 DIAGNOSIS — N611 Abscess of the breast and nipple: Secondary | ICD-10-CM | POA: Diagnosis not present

## 2022-10-15 MED ORDER — DOXYCYCLINE HYCLATE 100 MG PO CAPS: 100.0000 mg | ORAL_CAPSULE | Freq: Two times a day (BID) | ORAL | 0 refills | Status: DC

## 2022-10-15 NOTE — Discharge Instructions (Signed)

## 2022-10-15 NOTE — ED Provider Notes (Signed)
[facility]    CSN: [id number] Arrival date & time: 10/15/22  0806      History   Chief Complaint Chief Complaint  Patient presents with   Abscess    HPI [name] is a 47 y.o. male.   Patient presents to urgent care for evaluation of abscess to the left breast behind the left nipple that he first noticed approximately 3 to 4 days ago.  This is a reoccurring abscess and last became infected in March 2024.  Abscess is warm, tender, and swollen to touch.   He has not noted any nipple discharge, masses to the left breast. History of type 2 diabetes, taking medications as prescribed.  Blood sugars have been between 150-200.  No fever, chills, body aches, nausea, vomiting.  He has not attempted use of any warm compresses or over-the-counter medications to help with symptoms prior to arrival urgent care. Of note, 2 maternal aunts have history of breast cancer, otherwise no history of breast malignancy in the men of the family.  He is unsure about paternal family history.  He has never had a mammogram in the past.     Past Medical History:  Diagnosis Date   Acute pancreatitis 07/2010   Anxiety    Arthritis    "lower back and shoulders" (10/07/2017)   Chronic lower back pain    Dyslipidemia    Dyspnea    Headache    "weekly" (10/07/2017)   Hypercholesteremia    Hypertension    MRSA (methicillin resistant staph aureus) culture positive    Obesity    Obesity    OSA on CPAP    Peripheral neuropathy    Scoliosis    Sleep apnea    "can't tolerate any of the masks" (10/07/2017)   Sleep apnea    Stroke (HCC)    stroke-like 2.5 years ago   TIA (transient ischemic attack)    Type II diabetes mellitus (HCC)    Umbilical hernia     Patient Active Problem List   Diagnosis Date Noted   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/01/2020   Unilateral primary osteoarthritis, right hip 10/28/2019   Unilateral primary osteoarthritis,  left hip 10/28/2019   Retained orthopedic hardware 10/28/2019   Pain in right leg 09/09/2019   Shoulder joint pain 09/09/2019   Insomnia 07/20/2019   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   Neck pain 03/11/2019   Encounter for general adult medical examination without abnormal findings 07/10/2018   Peripheral vascular disease (HCC) 07/10/2018   Thrombocytopenia, unspecified (HCC) 07/03/2018   Type 2 diabetes mellitus without complication (HCC) 07/03/2018   OSA (obstructive sleep apnea) 07/03/2018   Excessive daytime sleepiness 07/03/2018   Class 3 severe obesity due to excess calories with serious comorbidity and body mass index (BMI) of 40.0 to 44.9 in adult (HCC) 07/03/2018   Non-seasonal allergic rhinitis due to pollen 07/03/2018   Anxiety disorder 06/05/2018   Chronic rhinitis 03/10/2018   Acute embolism and thrombosis of unspecified deep veins of right lower extremity (HCC) 02/19/2018   Plantar fascial fibromatosis 10/22/2017   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   TIA (transient ischemic attack) 10/07/2017   Urticaria 06/27/2017   Long term (current) use of insulin (HCC) 06/17/2017   Plantar wart 03/13/2017   ED (erectile dysfunction) of organic origin 12/13/2016   Degeneration of lumbosacral intervertebral disc 10/31/2016   Tobacco user 10/02/2016   Adjustment disorder with depressed mood  07/30/2016   Diabetic peripheral neuropathy associated with type 2 diabetes mellitus (HCC) 07/30/2016   Essential hypertension 07/30/2016   Fatigue 07/30/2016   Lipoprotein deficiency disorder 07/30/2016   Umbilical hernia 07/30/2016    Past Surgical History:  Procedure Laterality Date   HARDWARE REMOVAL Right 03/13/2021   Procedure: Right Hip HARDWARE REMOVAL;  Surgeon: Kathryne Hitch, MD;  Location: [facility] OR;  Service: Orthopedics;  Laterality: Right;   HIP PINNING Bilateral    "they are worn out" (10/07/2017)   PICC LINE INSERTION  07/2010   "in  hospital for ~ 2 months; took PICC out when I was discharged"   POSTERIOR CERVICAL FUSION/FORAMINOTOMY N/A 05/26/2019   Procedure: POSTERIOR CERVICAL FUSION WITH LATERAL MASS FIXATION CERVICAL THREE- THORACIC ONE;  Surgeon: Bedelia Person, MD;  Location: [facility] OR;  Service: Neurosurgery;  Laterality: N/A;  posterior, Cervical/Thoracic   TESTICLE REMOVAL Right ~ 2012   "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"   TOTAL HIP ARTHROPLASTY Right 03/13/2021   Procedure: Right TOTAL HIP ARTHROPLASTY ANTERIOR APPROACH;  Surgeon: Kathryne Hitch, MD;  Location: [facility] OR;  Service: Orthopedics;  Laterality: Right;       Home Medications    Prior to Admission medications   Medication Sig Start Date End Date Taking? Authorizing Provider  doxycycline (VIBRAMYCIN) 100 MG capsule Take 1 capsule (100 mg total) by mouth 2 (two) times daily. 10/15/22  Yes Carlisle Beers, FNP  amLODipine (NORVASC) 10 MG tablet Take 1 tablet (10 mg total) by mouth daily. 10/07/17   Nyra Market, MD  aspirin 81 MG chewable tablet Chew 1 tablet (81 mg total) by mouth 2 (two) times daily. 03/16/21   Kathryne Hitch, MD  carvedilol (COREG) 6.25 MG tablet Take 6.25 mg by mouth 2 (two) times daily with a meal. 12/17/19   [provider]  Continuous Blood Gluc Receiver (FREESTYLE LIBRE 14 DAY READER) DEVI use to monitor blood gluocse continuously 08/06/17   [provider]  Continuous Blood Gluc Sensor (FREESTYLE LIBRE 14 DAY SENSOR) MISC Apply topically every 14 (fourteen) days. 04/07/20   [provider]  Dulaglutide (TRULICITY) 1.5 MG/0.5ML SOPN Inject 1.5 mg into the skin every Saturday. 12/10/19   [provider]  escitalopram (LEXAPRO) 20 MG tablet Take 20 mg by mouth daily.    [provider]  gabapentin (NEURONTIN) 300 MG capsule Take 300-600 mg by mouth 3 (three) times daily.    [provider]  HUMALOG KWIKPEN 100 UNIT/ML KwikPen Inject 15  Units into the skin 3 (three) times daily before meals. 06/12/18   [provider]  hydrOXYzine (VISTARIL) 25 MG capsule Take 25 mg by mouth 2 (two) times daily as needed. 05/09/22   [provider]  insulin glargine, 1 Unit Dial, (TOUJEO) 300 UNIT/ML Solostar Pen Inject 60 Units into the skin 2 (two) times daily.    [provider]  Insulin Pen Needle (BD PEN NEEDLE NANO U/F) 32G X 4 MM MISC Use with insulin pens 4 times daily 06/17/17   [provider]  metFORMIN (GLUCOPHAGE-XR) 500 MG 24 hr tablet Take 2 tablets (1,000 mg total) by mouth 2 (two) times daily with a meal. 10/07/17   Nyra Market, MD  Multiple Vitamins-Minerals (AIRBORNE) CHEW Chew 3 tablets by mouth at bedtime.    [provider]  propranolol (INDERAL) 10 MG tablet Take 10 mg by mouth 3 (three) times daily.    [provider]  rosuvastatin (CRESTOR) 20 MG tablet  Take 20 mg by mouth daily. 05/09/22   [provider]  tiZANidine (ZANAFLEX) 4 MG tablet Take 1 tablet (4 mg total) by mouth every 8 (eight) hours as needed for muscle spasms. 03/16/21   Kathryne Hitch, MD  zolpidem (AMBIEN) 10 MG tablet Take 10 mg by mouth at bedtime as needed.    [provider]    Family History Family History  Problem Relation Age of Onset   Hypertension Mother    Hypertension Father    Sleep apnea Neg Hx     Social History Social History   Tobacco Use   Smoking status: Former    Current packs/day: 0.50    Average packs/day: 0.5 packs/day for 20.0 years (10.0 ttl pk-yrs)    Types: Cigarettes   Smokeless tobacco: Former    Types: Snuff   Tobacco comments:    10/07/2017 "quit years ago"  Vaping Use   Vaping status: Some Days  Substance Use Topics   Alcohol use: Yes    Comment: rare   Drug use: No     Allergies   Losartan   Review of Systems Review of Systems Per HPI  Physical Exam Triage Vital Signs ED Triage Vitals  Encounter Vitals Group     BP  10/15/22 0827 (!) 154/93     Systolic BP Percentile --      Diastolic BP Percentile --      Pulse Rate 10/15/22 0827 73     Resp 10/15/22 0827 18     Temp 10/15/22 0827 98.2 F (36.8 C)     Temp Source 10/15/22 0827 Oral     SpO2 10/15/22 0827 96 %     Weight --      Height --      Head Circumference --      Peak Flow --      Pain Score 10/15/22 0828 4     Pain Loc --      Pain Education --      Exclude from Growth Chart --    No data found.  Updated Vital Signs BP (!) 154/93 (BP Location: Right Arm)   Pulse 73   Temp 98.2 F (36.8 C) (Oral)   Resp 18   SpO2 96%   Visual Acuity Right Eye Distance:   Left Eye Distance:   Bilateral Distance:    Right Eye Near:   Left Eye Near:    Bilateral Near:     Physical Exam Vitals and nursing note reviewed.  Constitutional:      Appearance: He is not ill-appearing or toxic-appearing.  HENT:     Head: Normocephalic and atraumatic.     Right Ear: Hearing and external ear normal.     Left Ear: Hearing and external ear normal.     Nose: Nose normal.     Mouth/Throat:     Lips: Pink.  Eyes:     General: Lids are normal. Vision grossly intact. Gaze aligned appropriately.     Extraocular Movements: Extraocular movements intact.     Conjunctiva/sclera: Conjunctivae normal.  Pulmonary:     Effort: Pulmonary effort is normal.  Chest:     Chest wall: Swelling present.       Comments: Nipple inversion of the left breast secondary to swelling and abscess behind the left nipple in the right upper quadrant. Musculoskeletal:     Cervical back: Neck supple.  Skin:    General: Skin is warm and dry.     Capillary  Refill: Capillary refill takes less than 2 seconds.     Findings: No rash.  Neurological:     General: No focal deficit present.     Mental Status: He is alert and oriented to person, place, and time. Mental status is at baseline.     Cranial Nerves: No dysarthria or facial asymmetry.  Psychiatric:        Mood and  Affect: Mood normal.        Speech: Speech normal.        Behavior: Behavior normal.        Thought Content: Thought content normal.        Judgment: Judgment normal.        UC Treatments / Results  Labs (all labs ordered are listed, but only abnormal results are displayed) Labs Reviewed - No data to display  EKG   Radiology No results found.  Procedures Procedures (including critical care time)  Medications Ordered in UC Medications - No data to display  Initial Impression / Assessment and Plan / UC Course  I have reviewed the triage vital signs and the nursing notes.  Pertinent labs & imaging results that were available during my care of the patient were reviewed by me and considered in my medical decision making (see chart for details).   1. Abscess of left breast Infected abscess of left breast with cellulitis, will not attempt I&D today due to location of abscess. Management with warm compresses, antibiotics, and ibuprofen as needed for pain. Doxycycline BID for 10 days. Recommend follow-up with PCP who may recommend mammogram at a later date when infection resolves to rule out underlying breast tissue abnormality.   Counseled patient on potential for adverse effects with medications prescribed/recommended today, strict ER and return-to-clinic precautions discussed, patient verbalized understanding.    Final Clinical Impressions(s) / UC Diagnoses   Final diagnoses:  Abscess of left breast     Discharge Instructions      Your abscess has been evaluated in the clinic and appears to be infected, but is not quite ready for drainage.   I would like for you to perform warm compresses to the area frequently and continue taking over the counter medications for pain and inflammation as directed.   Start taking antibiotic sent to pharmacy as directed. This will help reduce infection to area and help the abscess mature/drain.   If abscess does not begin to drain on  its own over the next few days and becomes softer, please return to urgent care for this to be drained appropriately.   If you have any fever, chills, nausea, vomiting, or worsening pain/swelling to the area, please return to urgent care for reevaluation.     ED Prescriptions     Medication Sig Dispense Auth. Provider   doxycycline (VIBRAMYCIN) 100 MG capsule Take 1 capsule (100 mg total) by mouth 2 (two) times daily. 20 capsule Carlisle Beers, FNP      PDMP not reviewed this encounter.   Carlisle Beers, [place] 10/19/22 2115

## 2022-10-15 NOTE — ED Triage Notes (Signed)
Pt c/o chronic abscess to lt breast that became inflamed for the passed week. Denies drainage.

## 2022-10-16 DIAGNOSIS — N611 Abscess of the breast and nipple: Secondary | ICD-10-CM | POA: Diagnosis not present

## 2022-10-17 ENCOUNTER — Other Ambulatory Visit: Payer: Self-pay

## 2022-10-17 DIAGNOSIS — N641 Fat necrosis of breast: Secondary | ICD-10-CM | POA: Diagnosis not present

## 2022-10-17 DIAGNOSIS — N611 Abscess of the breast and nipple: Secondary | ICD-10-CM | POA: Diagnosis not present

## 2022-10-17 DIAGNOSIS — R928 Other abnormal and inconclusive findings on diagnostic imaging of breast: Secondary | ICD-10-CM | POA: Diagnosis not present

## 2022-10-17 DIAGNOSIS — N6453 Retraction of nipple: Secondary | ICD-10-CM | POA: Diagnosis not present

## 2022-10-17 DIAGNOSIS — N63 Unspecified lump in unspecified breast: Secondary | ICD-10-CM | POA: Diagnosis not present

## 2022-10-17 DIAGNOSIS — D242 Benign neoplasm of left breast: Secondary | ICD-10-CM | POA: Diagnosis not present

## 2022-10-28 DIAGNOSIS — E1151 Type 2 diabetes mellitus with diabetic peripheral angiopathy without gangrene: Secondary | ICD-10-CM | POA: Diagnosis not present

## 2022-10-28 DIAGNOSIS — Z0000 Encounter for general adult medical examination without abnormal findings: Secondary | ICD-10-CM | POA: Diagnosis not present

## 2022-10-28 DIAGNOSIS — G4733 Obstructive sleep apnea (adult) (pediatric): Secondary | ICD-10-CM | POA: Diagnosis not present

## 2022-10-28 DIAGNOSIS — I10 Essential (primary) hypertension: Secondary | ICD-10-CM | POA: Diagnosis not present

## 2022-10-28 DIAGNOSIS — E1140 Type 2 diabetes mellitus with diabetic neuropathy, unspecified: Secondary | ICD-10-CM | POA: Diagnosis not present

## 2022-10-28 DIAGNOSIS — R82998 Other abnormal findings in urine: Secondary | ICD-10-CM | POA: Diagnosis not present

## 2022-10-28 DIAGNOSIS — Z1212 Encounter for screening for malignant neoplasm of rectum: Secondary | ICD-10-CM | POA: Diagnosis not present

## 2022-11-15 ENCOUNTER — Other Ambulatory Visit: Payer: Self-pay | Admitting: General Surgery

## 2022-11-15 DIAGNOSIS — N611 Abscess of the breast and nipple: Secondary | ICD-10-CM | POA: Diagnosis not present

## 2022-11-26 DIAGNOSIS — G4733 Obstructive sleep apnea (adult) (pediatric): Secondary | ICD-10-CM | POA: Diagnosis not present

## 2022-11-28 DIAGNOSIS — G4733 Obstructive sleep apnea (adult) (pediatric): Secondary | ICD-10-CM | POA: Diagnosis not present

## 2023-03-12 ENCOUNTER — Ambulatory Visit (HOSPITAL_COMMUNITY)
Admission: EM | Admit: 2023-03-12 | Discharge: 2023-03-12 | Disposition: A | Discharge status: Home / Self Care | Payer: Self-pay | Attending: Family Medicine | Admitting: Family Medicine

## 2023-03-12 ENCOUNTER — Encounter (HOSPITAL_COMMUNITY): Payer: Self-pay

## 2023-03-12 DIAGNOSIS — J0100 Acute maxillary sinusitis, unspecified: Secondary | ICD-10-CM

## 2023-03-12 MED ORDER — AMOXICILLIN-POT CLAVULANATE 875-125 MG PO TABS: 1.0000 | ORAL_TABLET | Freq: Two times a day (BID) | ORAL | 0 refills | Status: DC

## 2023-03-12 MED ORDER — METHYLPREDNISOLONE 4 MG PO TBPK: ORAL_TABLET | ORAL | 0 refills | Status: DC

## 2023-03-12 NOTE — ED Triage Notes (Signed)
 C/o sinus pressure and headache x 3 weeks.

## 2023-03-12 NOTE — ED Provider Notes (Signed)
 [facility]    CSN: [id number] Arrival date & time: 03/12/23  1300      History   Chief Complaint Chief Complaint  Patient presents with   Nasal Congestion    HPI [name] is a 48 y.o. male.   Patient is here for sinus congestion, pain, pressure x 3 weeks.  Feeling it from his ears to his nose.  He has sleep apnea and can't use his mask.  He uses nasal spray and works for a short time.  No fevers/chills.        Past Medical History:  Diagnosis Date   Acute pancreatitis 07/2010   Anxiety    Arthritis    lower back and shoulders (10/07/2017)   Chronic lower back pain    Dyslipidemia    Dyspnea    Headache    weekly (10/07/2017)   Hypercholesteremia    Hypertension    MRSA (methicillin resistant staph aureus) culture positive    Obesity    Obesity    OSA on CPAP    Peripheral neuropathy    Scoliosis    Sleep apnea    can't tolerate any of the masks (10/07/2017)   Sleep apnea    Stroke (HCC)    stroke-like 2.5 years ago   TIA (transient ischemic attack)    Type II diabetes mellitus (HCC)    Umbilical hernia     Patient Active Problem List   Diagnosis Date Noted   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/01/2020   Unilateral primary osteoarthritis, right hip 10/28/2019   Unilateral primary osteoarthritis, left hip 10/28/2019   Retained orthopedic hardware 10/28/2019   Pain in right leg 09/09/2019   Shoulder joint pain 09/09/2019   Insomnia 07/20/2019   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   Neck pain 03/11/2019   Encounter for general adult medical examination without abnormal findings 07/10/2018   Peripheral vascular disease (HCC) 07/10/2018   Thrombocytopenia, unspecified (HCC) 07/03/2018   Type 2 diabetes mellitus without complication (HCC) 07/03/2018   OSA (obstructive sleep apnea) 07/03/2018   Excessive daytime sleepiness 07/03/2018   Class 3 severe obesity due to  excess calories with serious comorbidity and body mass index (BMI) of 40.0 to 44.9 in adult (HCC) 07/03/2018   Non-seasonal allergic rhinitis due to pollen 07/03/2018   Anxiety disorder 06/05/2018   Chronic rhinitis 03/10/2018   Acute embolism and thrombosis of unspecified deep veins of right lower extremity (HCC) 02/19/2018   Plantar fascial fibromatosis 10/22/2017   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   TIA (transient ischemic attack) 10/07/2017   Urticaria 06/27/2017   Long term (current) use of insulin  (HCC) 06/17/2017   Plantar wart 03/13/2017   ED (erectile dysfunction) of organic origin 12/13/2016   Degeneration of lumbosacral intervertebral disc 10/31/2016   Tobacco user 10/02/2016   Adjustment disorder with depressed mood 07/30/2016   Diabetic peripheral neuropathy associated with type 2 diabetes mellitus (HCC) 07/30/2016   Essential hypertension 07/30/2016   Fatigue 07/30/2016   Lipoprotein deficiency disorder 07/30/2016   Umbilical hernia 07/30/2016    Past Surgical History:  Procedure Laterality Date   HARDWARE REMOVAL Right 03/13/2021   Procedure: Right Hip HARDWARE REMOVAL;  Surgeon: Vernetta Lonni GRADE, MD;  Location: [facility] OR;  Service: Orthopedics;  Laterality: Right;   HIP PINNING Bilateral    they are worn out (10/07/2017)   PICC LINE INSERTION  07/2010   in hospital for ~ 2 months; took PICC  out when I was discharged   POSTERIOR CERVICAL FUSION/FORAMINOTOMY N/A 05/26/2019   Procedure: POSTERIOR CERVICAL FUSION WITH LATERAL MASS FIXATION CERVICAL THREE- THORACIC ONE;  Surgeon: Debby Dorn MATSU, MD;  Location: [facility] OR;  Service: Neurosurgery;  Laterality: N/A;  posterior, Cervical/Thoracic   TESTICLE REMOVAL Right ~ 2012   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   TOTAL HIP ARTHROPLASTY Right 03/13/2021   Procedure: Right TOTAL HIP ARTHROPLASTY ANTERIOR APPROACH;  Surgeon: Vernetta Lonni GRADE, MD;  Location: [facility] OR;  Service:  Orthopedics;  Laterality: Right;       Home Medications    Prior to Admission medications   Medication Sig Start Date End Date Taking? Authorizing Provider  amLODipine  (NORVASC ) 10 MG tablet Take 1 tablet (10 mg total) by mouth daily. 10/07/17  Yes Svalina, Gorica, MD  aspirin  81 MG chewable tablet Chew 1 tablet (81 mg total) by mouth 2 (two) times daily. 03/16/21  Yes Vernetta Lonni GRADE, MD  carvedilol  (COREG ) 6.25 MG tablet Take 6.25 mg by mouth 2 (two) times daily with a meal. 12/17/19  Yes [provider]  Continuous Blood Gluc Receiver (FREESTYLE LIBRE 14 DAY READER) DEVI use to monitor blood gluocse continuously 08/06/17  Yes [provider]  Continuous Blood Gluc Sensor (FREESTYLE LIBRE 14 DAY SENSOR) MISC Apply topically every 14 (fourteen) days. 04/07/20  Yes [provider]  doxycycline  (VIBRAMYCIN ) 100 MG capsule Take 1 capsule (100 mg total) by mouth 2 (two) times daily. 10/15/22  Yes Enedelia Dorna HERO, FNP  Dulaglutide  (TRULICITY ) 1.5 MG/0.5ML SOPN Inject 1.5 mg into the skin every Saturday. 12/10/19  Yes [provider]  escitalopram  (LEXAPRO ) 20 MG tablet Take 20 mg by mouth daily.   Yes [provider]  gabapentin  (NEURONTIN ) 300 MG capsule Take 300-600 mg by mouth 3 (three) times daily.   Yes [provider]  HUMALOG  KWIKPEN 100 UNIT/ML KwikPen Inject 15 Units into the skin 3 (three) times daily before meals. 06/12/18  Yes [provider]  hydrOXYzine (VISTARIL) 25 MG capsule Take 25 mg by mouth 2 (two) times daily as needed. 05/09/22  Yes [provider]  insulin  glargine, 1 Unit Dial , (TOUJEO ) 300 UNIT/ML Solostar Pen Inject 60 Units into the skin 2 (two) times daily.   Yes [provider]  Insulin  Pen Needle (BD PEN NEEDLE NANO U/F) 32G X 4 MM MISC Use with insulin  pens 4 times daily 06/17/17  Yes [provider]  metFORMIN  (GLUCOPHAGE -XR) 500 MG 24 hr tablet Take 2 tablets (1,000 mg  total) by mouth 2 (two) times daily with a meal. 10/07/17  Yes Loreta Levels, MD  Multiple Vitamins-Minerals (AIRBORNE) CHEW Chew 3 tablets by mouth at bedtime.   Yes [provider]  propranolol  (INDERAL ) 10 MG tablet Take 10 mg by mouth 3 (three) times daily.   Yes [provider]  rosuvastatin  (CRESTOR ) 20 MG tablet Take 20 mg by mouth daily. 05/09/22  Yes [provider]  tiZANidine  (ZANAFLEX ) 4 MG tablet Take 1 tablet (4 mg total) by mouth every 8 (eight) hours as needed for muscle spasms. 03/16/21   Vernetta Lonni GRADE, MD  zolpidem (AMBIEN) 10 MG tablet Take 10 mg by mouth at bedtime as needed.   Yes [provider]    Family History Family History  Problem Relation Age of Onset   Hypertension Mother    Hypertension Father    Sleep apnea Neg Hx     Social History Social History   Tobacco  Use   Smoking status: Former    Current packs/day: 0.50    Average packs/day: 0.5 packs/day for 20.0 years (10.0 ttl pk-yrs)    Types: Cigarettes   Smokeless tobacco: Former    Types: Snuff   Tobacco comments:    10/07/2017 quit years ago  Vaping Use   Vaping status: Some Days  Substance Use Topics   Alcohol  use: Yes    Comment: rare   Drug use: No     Allergies   Losartan   Review of Systems Review of Systems  Constitutional: Negative.   HENT:  Positive for congestion, rhinorrhea, sinus pressure and sinus pain.   Respiratory: Negative.    Cardiovascular: Negative.   Gastrointestinal: Negative.   Musculoskeletal: Negative.   Psychiatric/Behavioral: Negative.       Physical Exam Triage Vital Signs ED Triage Vitals [03/12/23 1422]  Encounter Vitals Group     BP 123/83     Systolic BP Percentile      Diastolic BP Percentile      Pulse Rate 88     Resp 16     Temp 98 F (36.7 C)     Temp Source Oral     SpO2 93 %     Weight      Height      Head Circumference      Peak Flow      Pain Score      Pain Loc      Pain Education       Exclude from Growth Chart    No data found.  Updated Vital Signs BP 123/83 (BP Location: Left Arm)   Pulse 88   Temp 98 F (36.7 C) (Oral)   Resp 16   SpO2 93%   Visual Acuity Right Eye Distance:   Left Eye Distance:   Bilateral Distance:    Right Eye Near:   Left Eye Near:    Bilateral Near:     Physical Exam Constitutional:      Appearance: Normal appearance. He is normal weight.  HENT:     Nose: Congestion present.     Right Sinus: Maxillary sinus tenderness present.     Left Sinus: Maxillary sinus tenderness present.     Mouth/Throat:     Mouth: Mucous membranes are moist.     Pharynx: Posterior oropharyngeal erythema present.  Cardiovascular:     Rate and Rhythm: Normal rate and regular rhythm.  Pulmonary:     Effort: Pulmonary effort is normal.     Breath sounds: Normal breath sounds.  Musculoskeletal:     Cervical back: Normal range of motion and neck supple. No tenderness.  Neurological:     General: No focal deficit present.     Mental Status: He is alert.  Psychiatric:        Mood and Affect: Mood normal.      UC Treatments / Results  Labs (all labs ordered are listed, but only abnormal results are displayed) Labs Reviewed - No data to display  EKG   Radiology No results found.  Procedures Procedures (including critical care time)  Medications Ordered in UC Medications - No data to display  Initial Impression / Assessment and Plan / UC Course  I have reviewed the triage vital signs and the nursing notes.  Pertinent labs & imaging results that were available during my care of the patient were reviewed by me and considered in my medical decision making (see chart for details).  Final Clinical  Impressions(s) / UC Diagnoses   Final diagnoses:  Acute non-recurrent maxillary sinusitis     Discharge Instructions      You were seen today for acute sinusitis.  I have sent out an antibiotic and steroid to treat your symptoms.  You  may use over the counter claritin/zyrtec  to help as well, and flonase  nasal spray.  Please return if not improving or worsening.     ED Prescriptions     Medication Sig Dispense Auth. Provider   amoxicillin -clavulanate (AUGMENTIN ) 875-125 MG tablet Take 1 tablet by mouth every 12 (twelve) hours. 14 tablet Arcenio Mullaly, MD   methylPREDNISolone  (MEDROL  DOSEPAK) 4 MG TBPK tablet Take as directed 1 each Darral Longs, MD      PDMP not reviewed this encounter.   Darral Longs, MD 03/12/23 1444

## 2023-03-12 NOTE — Discharge Instructions (Signed)
 You were seen today for acute sinusitis.  I have sent out an antibiotic and steroid to treat your symptoms.  You may use over the counter claritin/zyrtec to help as well, and flonase nasal spray.  Please return if not improving or worsening.

## 2023-09-15 ENCOUNTER — Ambulatory Visit: Payer: Self-pay | Admitting: Family Medicine

## 2023-10-24 ENCOUNTER — Ambulatory Visit (INDEPENDENT_AMBULATORY_CARE_PROVIDER_SITE_OTHER): Payer: Self-pay | Admitting: Family Medicine

## 2023-10-24 ENCOUNTER — Encounter: Payer: Self-pay | Admitting: Family Medicine

## 2023-10-24 VITALS — BP 124/78 | HR 104 | Temp 97.0°F | Ht 66.0 in | Wt 281.4 lb

## 2023-10-24 DIAGNOSIS — Z794 Long term (current) use of insulin: Secondary | ICD-10-CM

## 2023-10-24 DIAGNOSIS — E785 Hyperlipidemia, unspecified: Secondary | ICD-10-CM | POA: Insufficient documentation

## 2023-10-24 DIAGNOSIS — F411 Generalized anxiety disorder: Secondary | ICD-10-CM | POA: Diagnosis not present

## 2023-10-24 DIAGNOSIS — I10 Essential (primary) hypertension: Secondary | ICD-10-CM | POA: Diagnosis not present

## 2023-10-24 DIAGNOSIS — Z7985 Long-term (current) use of injectable non-insulin antidiabetic drugs: Secondary | ICD-10-CM

## 2023-10-24 DIAGNOSIS — E1142 Type 2 diabetes mellitus with diabetic polyneuropathy: Secondary | ICD-10-CM | POA: Insufficient documentation

## 2023-10-24 DIAGNOSIS — Z720 Tobacco use: Secondary | ICD-10-CM | POA: Insufficient documentation

## 2023-10-24 DIAGNOSIS — Z7984 Long term (current) use of oral hypoglycemic drugs: Secondary | ICD-10-CM

## 2023-10-24 MED ORDER — ROSUVASTATIN CALCIUM 20 MG PO TABS
20.0000 mg | ORAL_TABLET | Freq: Every day | ORAL | 3 refills | Status: AC
Start: 2023-10-24 — End: ?

## 2023-10-24 MED ORDER — ESCITALOPRAM OXALATE 20 MG PO TABS: 20.0000 mg | ORAL_TABLET | Freq: Every day | ORAL | 3 refills | Status: AC

## 2023-10-24 NOTE — Assessment & Plan Note (Signed)
 Stable. Continue escitalopram  20 mg daily and propranolol  10 mg TID.

## 2023-10-24 NOTE — Assessment & Plan Note (Signed)
 Noting new issues with numbness in his fingers. Continue gabapentin  300 mg 2 caps TID.

## 2023-10-24 NOTE — Assessment & Plan Note (Addendum)
 Blood pressure is in good control. Continue amlodipine  10 mg daily and carvedilol  6.25 mg bid. I will assess his renal function.

## 2023-10-24 NOTE — Assessment & Plan Note (Signed)
 Strongly advise [name] to stop nicotine  use.

## 2023-10-24 NOTE — Assessment & Plan Note (Addendum)
 I will check diabetes labs to determine where he is at currently. I anticipate, based on his history, that this will be significantly uncontrolled. I will ask the [facility] pharmacist to assist in looking at his insulin  regimen and see if there are insulins that are on his formulary that are covered or affordable. I recommend he continue his metformin  500 mg two tabs (1,000 mg) bid. [name] is also apparently past due for a number of other health screenings. I will have him back within a month for a physical and to address these issues.

## 2023-10-24 NOTE — Assessment & Plan Note (Signed)
 I will reassess his lipids. Continue rosuvastatin  20 mg daily.

## 2023-10-24 NOTE — Progress Notes (Signed)
 [facility] [address] [place] [address] Dept: [phone] Dept Fax: [phone]  New Patient Office Visit  Subjective:    Patient ID: [name], male    DOB: [birth year], 48 y.o..   MRN: [id number]  Chief Complaint  Patient presents with   Establish Care    NP- establish care.      History of Present Illness:  Patient is in today to establish care. [name] was born in [place]. He only finished the 9th grade, but alter obtained his GED. 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 He is currently single, with an on again/off again girlfriend. He lives with his mother and his 53 year-old niece that he cares for (her mother died from drug addiction issues). He has three children (16, 19, 24). [name] reports having smoked 3 ppd of cigarettes for 15 years (45 pack-year history). He currently vapes. He has considered quitting, but has not taken steps to achieve this. He rarely drinks alcohol  and denies drug use.  [name] has hypertension with a prior history of a TIA and vertebrobasilar artery syndrome.  He is managed on amlodipine  10 mg daily and carvedilol  6.25 mg bid. He also takes a daily aspirin  81 mg.  [name] has had a prior right DVT in 2019. He was on rivaroxaban for a time.  [name] has Type 2 diabetes. This was diagnosed in 2012 during an admission for acute pancreatitis. At the time, his triglycerides were 2,228 with a cholesterol of 594. He is currently managed on rosuvastatin  20 mg daily for lipid control. For diabetes, he has been prescribed metformin  500 mg 2 tabs (1,000 mg) twice a daily, dulaglutide  (Trulicity ) 1.5 mg weekly, insulin  lispro (Humalog ) 15 units with meals, and insulin  glargine (Toujeo ) 60 units bid. He notes he had insurance changes that altered his coverage for his insulins. He has not been taking insulin   or his Trulicity  in many months. He has also not been checking his blood sugars, as he knows these are high. His diabetes has been complicated with painful neuropathy. He is managed on gabapentin  300 mg 2 caps TID.  [name] has a history of generalized anxiety he is managed on escitalopram  20 mg daily and propranolol  10 mg TID.  [name] has a history of hidradenitis suppurativa. He notes he has been prescribed doxycycline  for this, though he is unsure of the dose.  [name] has obstructive sleep apnea. He is managed with a CPAP.  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/01/2020   Unilateral primary osteoarthritis, right hip 10/28/2019   Unilateral primary osteoarthritis, left hip 10/28/2019   Shoulder joint pain 09/09/2019   Insomnia 07/20/2019   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   Peripheral vascular disease (HCC) 07/10/2018   Thrombocytopenia, unspecified (HCC) 07/03/2018   OSA (obstructive sleep apnea) 07/03/2018   Class 3 severe obesity due to excess calories with serious comorbidity and body mass index (BMI) of 40.0 to 44.9 in adult 07/03/2018   Non-seasonal allergic rhinitis due to pollen 07/03/2018   Anxiety disorder 06/05/2018   Chronic rhinitis 03/10/2018   Acute embolism and  thrombosis of unspecified deep veins of right lower extremity (HCC) 02/19/2018   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   TIA (transient ischemic attack) 10/07/2017   Urticaria 06/27/2017   Long term (current) use of insulin  (HCC) 06/17/2017   ED (erectile dysfunction) of organic origin 12/13/2016   Degeneration of lumbosacral intervertebral disc 10/31/2016   Former tobacco use 10/02/2016   Adjustment disorder with  depressed mood 07/30/2016   Diabetic peripheral neuropathy (HCC) 07/30/2016   Essential hypertension 07/30/2016   Lipoprotein deficiency disorder 07/30/2016   Umbilical hernia 07/30/2016   Past Surgical History:  Procedure Laterality Date   HARDWARE REMOVAL Right 03/13/2021   Procedure: Right Hip HARDWARE REMOVAL;  Surgeon: Vernetta Lonni GRADE, MD;  Location: [facility] OR;  Service: Orthopedics;  Laterality: Right;   HIP PINNING Bilateral    they are worn out (10/07/2017)   PICC LINE INSERTION  07/2010   in hospital for ~ 2 months; took PICC out when I was discharged   POSTERIOR CERVICAL FUSION/FORAMINOTOMY N/A 05/26/2019   Procedure: POSTERIOR CERVICAL FUSION WITH LATERAL MASS FIXATION CERVICAL THREE- THORACIC ONE;  Surgeon: Debby Dorn MATSU, MD;  Location: [facility] OR;  Service: Neurosurgery;  Laterality: N/A;  posterior, Cervical/Thoracic   TESTICLE REMOVAL Right ~ 2012   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   TOTAL HIP ARTHROPLASTY Right 03/13/2021   Procedure: Right TOTAL HIP ARTHROPLASTY ANTERIOR APPROACH;  Surgeon: Vernetta Lonni GRADE, MD;  Location: [facility] OR;  Service: Orthopedics;  Laterality: Right;   Family History  Problem Relation Age of Onset   Hypertension Mother    Hypertension Father    Sleep apnea Neg Hx    Outpatient Medications Prior to Visit  Medication Sig Dispense Refill   amLODipine  (NORVASC ) 10 MG tablet Take 1 tablet (10 mg total) by mouth daily. 90 tablet 0   aspirin  81 MG chewable tablet Chew 1 tablet (81 mg total) by mouth 2 (two) times daily. 30 tablet 0   carvedilol  (COREG ) 6.25 MG tablet Take 6.25 mg by mouth 2 (two) times daily with a meal.     Continuous Blood Gluc Receiver (FREESTYLE LIBRE 14 DAY READER) DEVI use to monitor blood gluocse continuously     Continuous Blood Gluc Sensor (FREESTYLE LIBRE 14 DAY SENSOR) MISC Apply topically every 14 (fourteen) days.     gabapentin  (NEURONTIN ) 300 MG capsule Take 300-600 mg by mouth 3  (three) times daily.     Insulin  Pen Needle (BD PEN NEEDLE NANO U/F) 32G X 4 MM MISC Use with insulin  pens 4 times daily     metFORMIN  (GLUCOPHAGE -XR) 500 MG 24 hr tablet Take 2 tablets (1,000 mg total) by mouth 2 (two) times daily with a meal. 120 tablet 2   propranolol  (INDERAL ) 10 MG tablet Take 10 mg by mouth 3 (three) times daily.     zolpidem (AMBIEN) 10 MG tablet Take 10 mg by mouth at bedtime as needed.     escitalopram  (LEXAPRO ) 20 MG tablet Take 20 mg by mouth daily.     hydrOXYzine (VISTARIL) 25 MG capsule Take 25 mg by mouth 2 (two) times daily as needed.     rosuvastatin  (CRESTOR ) 20 MG tablet Take 20 mg by mouth daily.     Dulaglutide  (TRULICITY ) 1.5 MG/0.5ML SOPN Inject 1.5 mg into the skin every Saturday. (Patient not taking: Reported on 10/24/2023)     HUMALOG  KWIKPEN 100 UNIT/ML KwikPen Inject 15 Units into the skin 3 (three) times daily before meals. (Patient not taking: Reported on 10/24/2023)  insulin  glargine, 1 Unit Dial , (TOUJEO ) 300 UNIT/ML Solostar Pen Inject 60 Units into the skin 2 (two) times daily. (Patient not taking: Reported on 10/24/2023)     amoxicillin -clavulanate (AUGMENTIN ) 875-125 MG tablet Take 1 tablet by mouth every 12 (twelve) hours. 14 tablet 0   methylPREDNISolone  (MEDROL  DOSEPAK) 4 MG TBPK tablet Take as directed 1 each 0   Multiple Vitamins-Minerals (AIRBORNE) CHEW Chew 3 tablets by mouth at bedtime.     tiZANidine  (ZANAFLEX ) 4 MG tablet Take 1 tablet (4 mg total) by mouth every 8 (eight) hours as needed for muscle spasms. 60 tablet 1   No facility-administered medications prior to visit.   Allergies  Allergen Reactions   Losartan Swelling and Rash    Patient says 'he's not 100% sure if it was losartan, but he thinks it is. He knows it was a blood pressure medicine.   Objective:   Today's Vitals   10/24/23 1408  BP: 124/78  Pulse: (!) 104  Temp: (!) 97 F (36.1 C)  TempSrc: Temporal  SpO2: 97%  Weight: 281 lb 6.4 oz (127.6 kg)  Height:  5' 6 (1.676 m)   Body mass index is 45.42 kg/m.   General: Well developed, well nourished. No acute distress. Psych: Alert and oriented. Normal mood and affect.  Health Maintenance Due  Topic Date Due   FOOT EXAM  Never done   OPHTHALMOLOGY EXAM  Never done   Diabetic kidney evaluation - Urine ACR  Never done   Hepatitis C Screening  Never done   DTaP/Tdap/Td (1 - Tdap) Never done   Hepatitis B Vaccines 19-59 Average Risk (1 of 3 - 19+ 3-dose series) Never done   Pneumococcal Vaccine (2 of 2 - PCV) 10/08/2018   HEMOGLOBIN A1C  09/06/2021   Colonoscopy  Never done   Diabetic kidney evaluation - eGFR measurement  03/14/2022     Assessment & Plan:   Problem List Items Addressed This Visit       Cardiovascular and Mediastinum   Essential hypertension - Primary   Blood pressure is in good control. Continue amlodipine  10 mg daily and carvedilol  6.25 mg bid. I will assess his renal function.      Relevant Medications   rosuvastatin  (CRESTOR ) 20 MG tablet     Endocrine   Diabetic peripheral neuropathy (HCC)   Noting new issues with numbness in his fingers. Continue gabapentin  300 mg 2 caps TID.      Relevant Medications   rosuvastatin  (CRESTOR ) 20 MG tablet   escitalopram  (LEXAPRO ) 20 MG tablet   Type 2 diabetes mellitus with peripheral neuropathy (HCC)   I will check diabetes labs to determine where he is at currently. I anticipate, based on his history, that this will be significantly uncontrolled. I will ask the [facility] pharmacist to assist in looking at his insulin  regimen and see if there are insulins that are on his formulary that are covered or affordable. I recommend he continue his metformin  500 mg two tabs (1,000 mg) bid. [name] is also apparently past due for a number of other health screenings. I will have him back within a month for a physical and to address these issues.      Relevant Medications   rosuvastatin  (CRESTOR ) 20 MG tablet   escitalopram  (LEXAPRO ) 20  MG tablet   Other Relevant Orders   Comprehensive metabolic panel with GFR   Hemoglobin A1c   Microalbumin / creatinine urine ratio   Urinalysis, Routine w reflex microscopic  AMB Referral [facility] Care Management     Other   Anxiety disorder   Stable. Continue escitalopram  20 mg daily and propranolol  10 mg TID.      Relevant Medications   escitalopram  (LEXAPRO ) 20 MG tablet   Hyperlipidemia   I will reassess his lipids. Continue rosuvastatin  20 mg daily.      Relevant Medications   rosuvastatin  (CRESTOR ) 20 MG tablet   Other Relevant Orders   Lipid panel   Nicotine  vapor product user   Strongly advise [name] to stop nicotine  use.       Return in about 4 weeks (around 11/21/2023) for Annual preventative care.   Garnette CHRISTELLA Simpler, MD

## 2023-10-25 LAB — HEMOGLOBIN A1C
Est. average glucose Bld gHb Est-mCnc: 246 mg/dL
Hgb A1c MFr Bld: 10.2 % — ABNORMAL HIGH (ref 4.8–5.6)

## 2023-10-25 LAB — URINALYSIS, ROUTINE W REFLEX MICROSCOPIC
Bilirubin, UA: NEGATIVE
Leukocytes,UA: NEGATIVE
Nitrite, UA: NEGATIVE
Protein,UA: NEGATIVE
RBC, UA: NEGATIVE
Specific Gravity, UA: >=1.03 — AB (ref 1.005–1.030)
Urobilinogen, Ur: 0.2 mg/dL (ref 0.2–1.0)
pH, UA: 6 (ref 5.0–7.5)

## 2023-10-25 LAB — LIPID PANEL
Chol/HDL Ratio: 8 ratio — ABNORMAL HIGH (ref 0.0–5.0)
Cholesterol, Total: 272 mg/dL — ABNORMAL HIGH (ref 100–199)
HDL: 34 mg/dL — ABNORMAL LOW (ref >39)
Triglycerides: 915 mg/dL (ref 0–149)

## 2023-10-25 LAB — COMPREHENSIVE METABOLIC PANEL WITH GFR
ALT: 14 IU/L (ref 0–44)
AST: 14 IU/L (ref 0–40)
Albumin: 4.1 g/dL (ref 4.1–5.1)
Alkaline Phosphatase: 93 IU/L (ref 44–121)
BUN/Creatinine Ratio: 20 (ref 9–20)
BUN: 16 mg/dL (ref 6–24)
Bilirubin Total: 0.4 mg/dL (ref 0.0–1.2)
CO2: 19 mmol/L — ABNORMAL LOW (ref 20–29)
Calcium: 8.9 mg/dL (ref 8.7–10.2)
Chloride: 99 mmol/L (ref 96–106)
Creatinine, Ser: 0.8 mg/dL (ref 0.76–1.27)
Globulin, Total: 2.9 g/dL (ref 1.5–4.5)
Glucose: 338 mg/dL — ABNORMAL HIGH (ref 70–99)
Potassium: 4.4 mmol/L (ref 3.5–5.2)
Sodium: 135 mmol/L (ref 134–144)
Total Protein: 7 g/dL (ref 6.0–8.5)
eGFR: 111 mL/min/1.73 (ref >59)

## 2023-10-25 LAB — MICROALBUMIN / CREATININE URINE RATIO
Creatinine, Urine: 43.6 mg/dL
Microalb/Creat Ratio: 21 mg/g{creat} (ref 0–29)
Microalbumin, Urine: 9.3 ug/mL

## 2023-10-27 ENCOUNTER — Ambulatory Visit: Payer: Self-pay | Admitting: Family Medicine

## 2023-10-31 ENCOUNTER — Telehealth: Payer: Self-pay

## 2023-10-31 NOTE — Progress Notes (Signed)
 Complex Care Management Note Care Guide Note  10/31/2023 Name: [name] MRN: [id number] DOB: [birth year]   Complex Care Management Outreach Attempts: An unsuccessful telephone outreach was attempted today to offer the patient information about available complex care management services.  Follow Up Plan:  Additional outreach attempts will be made to offer the patient complex care management information and services.   Encounter Outcome:  No Answer  Dreama [facility]  [facility], [facility] Assistant Direct Dial : [phone]  Fax: [phone]

## 2023-11-11 NOTE — Progress Notes (Unsigned)
 Complex Care Management Note Care Guide Note  11/11/2023 Name: [name] MRN: [id number] DOB: 02-Jan-1976   Complex Care Management Outreach Attempts: A second unsuccessful outreach was attempted today to offer the patient with information about available complex care management services.  Follow Up Plan:  Additional outreach attempts will be made to offer the patient complex care management information and services.   Encounter Outcome:  No Answer  Dreama [facility]  [facility], [facility] Assistant Direct Dial : [phone]  Fax: [phone]

## 2023-11-14 NOTE — Progress Notes (Signed)
 Complex Care Management Note Care Guide Note  11/14/2023 Name: [name] MRN: [id number] DOB: November 07, 1975   Complex Care Management Outreach Attempts: A third unsuccessful outreach was attempted today to offer the patient with information about available complex care management services.  Follow Up Plan:  No further outreach attempts will be made at this time. We have been unable to contact the patient to offer or enroll patient in complex care management services.  Encounter Outcome:  No Answer  Dreama [facility]  [facility], [facility] VBCI Assistant Direct Dial : [phone]  Fax: [phone]

## 2023-11-14 NOTE — Progress Notes (Signed)
 Complex Care Management Note  Care Guide Note 11/14/2023 Name: [name] MRN: [id number] DOB: October 29, 1975  [name] is a 48 y.o. year old male who sees Rudd, Garnette HERO, MD for primary care. I reached out to Solon Sigel by phone today to offer complex care management services.  [name] was given information about Complex Care Management services today including:   The Complex Care Management services include support from the care team which includes your Nurse Care Manager, Clinical Social Worker, or Pharmacist.  The Complex Care Management team is here to help remove barriers to the health concerns and goals most important to you. Complex Care Management services are voluntary, and the patient may decline or stop services at any time by request to their care team member.   Complex Care Management Consent Status: Patient did not agree to participate in complex care management services at this time.  Encounter Outcome:  Patient Refused  Dreama Agent [facility], [facility] VBCI Assistant Direct Dial : [phone]  Fax: [phone]

## 2023-12-09 ENCOUNTER — Encounter: Admitting: Family Medicine

## 2024-01-06 ENCOUNTER — Telehealth: Payer: Self-pay | Admitting: Physical Medicine and Rehabilitation

## 2024-01-06 NOTE — Telephone Encounter (Signed)
 Patient called and wants to schedule for L hip injection. CB#[phone]

## 2024-01-07 ENCOUNTER — Other Ambulatory Visit: Payer: Self-pay | Admitting: Physical Medicine and Rehabilitation

## 2024-01-07 DIAGNOSIS — M25552 Pain in left hip: Secondary | ICD-10-CM

## 2024-01-08 ENCOUNTER — Ambulatory Visit: Admitting: Physical Medicine and Rehabilitation

## 2024-01-08 ENCOUNTER — Other Ambulatory Visit: Payer: Self-pay

## 2024-01-08 DIAGNOSIS — M25552 Pain in left hip: Secondary | ICD-10-CM

## 2024-01-08 NOTE — Progress Notes (Signed)
 Pain Scale   Average Pain 9 Patient  is advising he has left hip pain, patient advising his pain is chronic.        +Driver, -BT, -Dye Allergies.

## 2024-01-18 DIAGNOSIS — M25552 Pain in left hip: Secondary | ICD-10-CM | POA: Diagnosis not present

## 2024-01-18 MED ORDER — BUPIVACAINE HCL 0.25 % IJ SOLN
4.0000 mL | INTRAMUSCULAR | Status: AC | PRN
  Administered 2024-01-18: 4 mL via INTRA_ARTICULAR

## 2024-01-18 MED ORDER — TRIAMCINOLONE ACETONIDE 40 MG/ML IJ SUSP
40.0000 mg | INTRAMUSCULAR | Status: AC | PRN
  Administered 2024-01-18: 40 mg via INTRA_ARTICULAR

## 2024-01-18 NOTE — Progress Notes (Signed)
 [name] - 48 y.o. male MRN [id number]  Date of birth: 1976/03/01  Office Visit Note: Visit Date: 01/08/2024 PCP: Thedora Garnette HERO, MD Referred by: Thedora Garnette HERO, MD  Subjective: Chief Complaint  Patient presents with   Left Hip - Pain   HPI:  [name] is a 48 y.o. male who comes in today for planned repeat Left anesthetic hip arthrogram with fluoroscopic guidance.  The patient has failed conservative care including home exercise, medications, time and activity modification. Prior injection gave more than 50% relief for several months. This injection will be diagnostic and hopefully therapeutic.  Please see requesting physician notes for further details and justification.  Referring: Duwaine Pouch, FNP   ROS Otherwise per HPI.  Assessment & Plan: Visit Diagnoses:    ICD-10-CM   1. Pain in left hip  M25.552 XR C-ARM NO REPORT      Plan: No additional findings.   Meds & Orders: No orders of the defined types were placed in this encounter.   Orders Placed This Encounter  Procedures   Large Joint Inj   XR C-ARM NO REPORT    Follow-up: No follow-ups on file.   Procedures: Large Joint Inj: L hip joint on 01/18/2024 12:00 PM Indications: diagnostic evaluation and pain Details: 22 G 3.5 in needle, fluoroscopy-guided anterior approach  Arthrogram: No  Medications: 4 mL bupivacaine  0.25 %; 40 mg triamcinolone  acetonide 40 MG/ML Outcome: tolerated well, no immediate complications  There was excellent flow of contrast producing a partial arthrogram of the hip. The patient did have relief of symptoms during the anesthetic phase of the injection. Procedure, treatment alternatives, risks and benefits explained, specific risks discussed. Consent was given by the patient. Immediately prior to procedure a time out was called to verify the correct patient, procedure, equipment, support staff and site/side marked as required. Patient was prepped and draped in the usual sterile  fashion.          Clinical History: No specialty comments available.     Objective:  VS:  HT:    WT:   BMI:     BP:   HR: bpm  TEMP: ( )  RESP:  Physical Exam Vitals and nursing note reviewed.  Constitutional:      General: He is not in acute distress.    Appearance: Normal appearance. He is obese. He is not ill-appearing.  HENT:     Head: Normocephalic and atraumatic.     Right Ear: External ear normal.     Left Ear: External ear normal.     Nose: No congestion.  Eyes:     Extraocular Movements: Extraocular movements intact.  Cardiovascular:     Rate and Rhythm: Normal rate.     Pulses: Normal pulses.  Pulmonary:     Effort: Pulmonary effort is normal. No respiratory distress.  Abdominal:     General: There is no distension.     Palpations: Abdomen is soft.  Musculoskeletal:        General: No tenderness or signs of injury.     Cervical back: Neck supple.     Right lower leg: No edema.     Left lower leg: No edema.     Comments: Patient has good distal strength without clonus.  Skin:    Findings: No erythema or rash.  Neurological:     General: No focal deficit present.     Mental Status: He is alert and oriented to person, place, and time.  Sensory: No sensory deficit.     Motor: No weakness or abnormal muscle tone.     Coordination: Coordination normal.  Psychiatric:        Mood and Affect: Mood normal.        Behavior: Behavior normal.      Imaging: No results found.

## 2024-02-02 ENCOUNTER — Encounter: Admitting: Physical Medicine and Rehabilitation
